# Patient Record
Sex: Female | Born: 1965 | Race: Black or African American | Hispanic: No | Marital: Married | Smoking: Former smoker
Health system: Southern US, Community
[De-identification: ages and names within clinical notes are randomized; demographics above are authoritative.]

## PROBLEM LIST (undated history)

## (undated) DIAGNOSIS — N952 Postmenopausal atrophic vaginitis: Secondary | ICD-10-CM

## (undated) DIAGNOSIS — M19041 Primary osteoarthritis, right hand: Secondary | ICD-10-CM

## (undated) DIAGNOSIS — M1990 Unspecified osteoarthritis, unspecified site: Secondary | ICD-10-CM

## (undated) DIAGNOSIS — M329 Systemic lupus erythematosus, unspecified: Secondary | ICD-10-CM

## (undated) DIAGNOSIS — F419 Anxiety disorder, unspecified: Secondary | ICD-10-CM

## (undated) DIAGNOSIS — M351 Other overlap syndromes: Secondary | ICD-10-CM

## (undated) DIAGNOSIS — M2241 Chondromalacia patellae, right knee: Principal | ICD-10-CM

## (undated) DIAGNOSIS — G479 Sleep disorder, unspecified: Secondary | ICD-10-CM

## (undated) DIAGNOSIS — M170 Bilateral primary osteoarthritis of knee: Secondary | ICD-10-CM

## (undated) DIAGNOSIS — M5136 Other intervertebral disc degeneration, lumbar region: Secondary | ICD-10-CM

## (undated) DIAGNOSIS — M549 Dorsalgia, unspecified: Secondary | ICD-10-CM

## (undated) DIAGNOSIS — R011 Cardiac murmur, unspecified: Secondary | ICD-10-CM

## (undated) DIAGNOSIS — M797 Fibromyalgia: Secondary | ICD-10-CM

## (undated) DIAGNOSIS — M5126 Other intervertebral disc displacement, lumbar region: Secondary | ICD-10-CM

## (undated) DIAGNOSIS — I10 Essential (primary) hypertension: Secondary | ICD-10-CM

## (undated) DIAGNOSIS — D649 Anemia, unspecified: Secondary | ICD-10-CM

## (undated) DIAGNOSIS — IMO0002 Reserved for concepts with insufficient information to code with codable children: Secondary | ICD-10-CM

## (undated) DIAGNOSIS — K219 Gastro-esophageal reflux disease without esophagitis: Secondary | ICD-10-CM

## (undated) DIAGNOSIS — R42 Dizziness and giddiness: Secondary | ICD-10-CM

## (undated) DIAGNOSIS — M19042 Primary osteoarthritis, left hand: Secondary | ICD-10-CM

## (undated) DIAGNOSIS — R7303 Prediabetes: Secondary | ICD-10-CM

## (undated) DIAGNOSIS — M2242 Chondromalacia patellae, left knee: Principal | ICD-10-CM

## (undated) HISTORY — DX: Chondromalacia patellae, right knee: M22.41

## (undated) HISTORY — DX: Unspecified osteoarthritis, unspecified site: M19.90

## (undated) HISTORY — DX: Other intervertebral disc degeneration, lumbar region: M51.36

## (undated) HISTORY — DX: Bilateral primary osteoarthritis of knee: M17.0

## (undated) HISTORY — PX: ABDOMINAL HYSTERECTOMY: SHX81

## (undated) HISTORY — DX: Dizziness and giddiness: R42

## (undated) HISTORY — DX: Anxiety disorder, unspecified: F41.9

## (undated) HISTORY — DX: Primary osteoarthritis, left hand: M19.042

## (undated) HISTORY — DX: Dorsalgia, unspecified: M54.9

## (undated) HISTORY — DX: Anemia, unspecified: D64.9

## (undated) HISTORY — DX: Chondromalacia patellae, left knee: M22.42

## (undated) HISTORY — DX: Postmenopausal atrophic vaginitis: N95.2

## (undated) HISTORY — DX: Essential (primary) hypertension: I10

## (undated) HISTORY — DX: Other intervertebral disc displacement, lumbar region: M51.26

## (undated) HISTORY — DX: Other overlap syndromes: M35.1

## (undated) HISTORY — PX: OTHER SURGICAL HISTORY: SHX169

## (undated) HISTORY — DX: Primary osteoarthritis, right hand: M19.041

## (undated) HISTORY — PX: TUBAL LIGATION: SHX77

## (undated) HISTORY — DX: Systemic lupus erythematosus, unspecified: M32.9

---

## 1998-05-31 ENCOUNTER — Other Ambulatory Visit: Admission: RE | Admit: 1998-05-31 | Discharge: 1998-05-31 | Payer: Self-pay | Admitting: Podiatry

## 1999-03-15 ENCOUNTER — Encounter: Admission: RE | Admit: 1999-03-15 | Discharge: 1999-06-13 | Payer: Self-pay | Admitting: Anesthesiology

## 2012-08-12 ENCOUNTER — Ambulatory Visit (INDEPENDENT_AMBULATORY_CARE_PROVIDER_SITE_OTHER): Payer: BC Managed Care – PPO | Admitting: Surgery

## 2012-08-27 ENCOUNTER — Ambulatory Visit (INDEPENDENT_AMBULATORY_CARE_PROVIDER_SITE_OTHER): Payer: BC Managed Care – PPO | Admitting: Surgery

## 2012-09-09 ENCOUNTER — Other Ambulatory Visit (INDEPENDENT_AMBULATORY_CARE_PROVIDER_SITE_OTHER): Payer: Self-pay

## 2012-09-09 ENCOUNTER — Encounter (INDEPENDENT_AMBULATORY_CARE_PROVIDER_SITE_OTHER): Payer: Self-pay | Admitting: Surgery

## 2012-09-09 ENCOUNTER — Ambulatory Visit (INDEPENDENT_AMBULATORY_CARE_PROVIDER_SITE_OTHER): Payer: BC Managed Care – PPO | Admitting: Surgery

## 2012-09-09 DIAGNOSIS — Z6841 Body Mass Index (BMI) 40.0 and over, adult: Secondary | ICD-10-CM

## 2012-09-09 DIAGNOSIS — Z0000 Encounter for general adult medical examination without abnormal findings: Secondary | ICD-10-CM

## 2012-09-09 NOTE — Progress Notes (Addendum)
Re:   Holly Davidson DOB:   01-03-1966 MRN:   [id number]  ASSESSMENT AND PLAN: 1.  Morbid obesity  Initial weight - 282,  BMI - 42.7  Per the 1991 NIH Consensus Statement, the patient is a candidate for bariatric surgery.  The patient attended our initial information session and reviewed the types of bariatric surgery.    The patient is interested in the Roux en Y Gastric Bypass.  I discussed with the patient the indications and risks of bariatric surgery.  The potential risks of surgery include, but are not limited to, bleeding, infection, leak from the bowel, DVT and PE, open surgery, long term nutrition consequences, and death.  The patient understands the importance of compliance and long term follow-up with our group after surgery.  From here we will obtain lab tests, x-rays, nutrition consult, and psych consult.  [Patient did not keep nutrition appointment.  DN  09/30/2012]  2.  Chronic pain meds  Though she is only using them about once a week. 3.  Arthritic Lupus 4.  Osteoarthritis [5.  Patient has small HH on UGI. I don't think will affect surgery.  DN 11/07/2012]  Chief Complaint  Patient presents with  . New Evaluation    new bariatric evaluation   REFERRING PHYSICIAN: MITCHELL,RAJAN, DO  HISTORY OF PRESENT ILLNESS: Holly Davidson is a 47 y.o. (DOB: December 07, 1965)  AA  female whose primary care physician is [facility], DO and comes to me today for bariatric surgery.  She is interested in the RYGB.  Patient has been to one of our information sessions the I gave.  She has tried multiple diets including Weight Watchers, Atkins, Slim fast, and a [employer]. She actually said "what I'm not tried?" She's tried different meds including Sensa and Adipex.  She has had the most success with Adipex. However, all the medicines seemed to stop working after wall.  She is on Phentermine right now.  She knows of no one who's had a gastric bypass, though she has someone that she knows who  had bariatric surgery, she just does not know what type.   I've encouraged her to come to our support group to speak to a patient who's had surgery. I've also encouraged her to bring her husband with her on her next visit.  Past Medical History  Diagnosis Date  . Arthritis   . Lupus arthritis   . Anemia   . Anxiety   . Back pain   . Herniated lumbar intervertebral disc       Past Surgical History  Procedure Laterality Date  . Abdominal hysterectomy    . Tubal ligation    . Cesarean section    . Right foot surgery        Current Outpatient Prescriptions  Medication Sig Dispense Refill  . clonazePAM (KLONOPIN) 0.5 MG tablet Take 0.5 mg by mouth 2 (two) times daily as needed for anxiety.      . cyclobenzaprine (FLEXERIL) 10 MG tablet Take 10 mg by mouth 3 (three) times daily as needed for muscle spasms.      Marland Kitchen escitalopram (LEXAPRO) 10 MG tablet Take 10 mg by mouth daily.      . furosemide (LASIX) 20 MG tablet Take 20 mg by mouth 2 (two) times daily.      Marland Kitchen oxyCODONE-acetaminophen (PERCOCET) 7.5-325 MG per tablet Take 1 tablet by mouth every 4 (four) hours as needed for pain.      . phentermine 37.5 MG capsule Take 37.5  mg by mouth every morning.       No current facility-administered medications for this visit.     No Known Allergies  REVIEW OF SYSTEMS: Skin:  No history of rash.  No history of abnormal moles. Infection:  No history of hepatitis or HIV.  No history of MRSA. Neurologic:  No history of stroke.  No history of seizure.  No history of headaches. Cardiac:  No history of hypertension. No history of heart disease.   No history of seeing a cardiologist. Pulmonary:  Does not smoke cigarettes.  No asthma or bronchitis.  No OSA/CPAP.  Endocrine:  No diabetes. No thyroid disease. Gastrointestinal:  No history of stomach disease.  No history of liver disease.  No history of gall bladder disease.  No history of pancreas disease.  No history of colon disease. Urologic:  No  history of kidney stones.  No history of bladder infections. GYN:  Hysterectomy/BSO in 2010 for endometriosis. Musculoskeletal:  Lupus arthritis and chronic knee pain.  She has used Prednisone to treat the Lupus, but does not like it.  She last used Prednisone about 4 months ago.  She has seen a rheumatologist at [facility], but cannot remember her name. Hematologic:  No bleeding disorder.  No history of anemia.  Not anticoagulated. Psycho-social:  The patient is oriented.   The patient has no obvious psychologic or social impairment to understanding our conversation and plan.  SOCIAL and FAMILY HISTORY: Married. Her husband is retired and he is fishing today. Works for [employer] risk [employer]. Two children - 23 and 48 years old.  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  They live in [place].  PHYSICAL EXAM: BP 160/90  Pulse 84  Temp(Src) 97.6 F (36.4 C)  Resp 18  Ht 5\' 8"  (1.727 m)  Wt 282 lb (127.914 kg)  BMI 42.89 kg/m2  General: AA F who is alert and generally healthy appearing.  HEENT: Normal. Pupils equal. Neck: Supple. No mass.  No thyroid mass. Lymph Nodes:  No supraclavicular or cervical nodes. Lungs: Clear to auscultation and symmetric breath sounds. Breasts:  Right - no mass  Left - no mass Heart:  RRR. No murmur or rub.  Abdomen: Soft. No mass. No tenderness. No hernia. Normal bowel sounds.  She is more pear than apple.  Her hysterectomy was done laparoscopically. Rectal: Not done. Extremities:  Good strength and ROM  in upper and lower extremities. Neurologic:  Grossly intact to motor and sensory function. Psychiatric: Has normal mood and affect. Behavior is normal.   DATA REVIEWED: Notes from Dr. Alger Simons, MD,  [facility] [address]  [address]   [place], [place]    [address] Phone:  [phone] FAX:  [phone]

## 2012-09-10 LAB — CBC WITH DIFFERENTIAL/PLATELET
Basophils Relative: 0 % (ref 0–1)
HCT: 38 % (ref 36.0–46.0)
Hemoglobin: 12.1 g/dL (ref 12.0–15.0)
Lymphocytes Relative: 36 % (ref 12–46)
Lymphs Abs: 2.3 10*3/uL (ref 0.7–4.0)
MCHC: 31.8 g/dL (ref 30.0–36.0)
Monocytes Absolute: 0.6 10*3/uL (ref 0.1–1.0)
Monocytes Relative: 9 % (ref 3–12)
Neutro Abs: 3.3 10*3/uL (ref 1.7–7.7)
RBC: 5.19 MIL/uL — ABNORMAL HIGH (ref 3.87–5.11)

## 2012-09-10 LAB — COMPREHENSIVE METABOLIC PANEL
AST: 16 U/L (ref 0–37)
BUN: 8 mg/dL (ref 6–23)
Calcium: 9.1 mg/dL (ref 8.4–10.5)
Chloride: 103 mEq/L (ref 96–112)
Creat: 0.65 mg/dL (ref 0.50–1.10)

## 2012-09-10 NOTE — Addendum Note (Signed)
Addended byEldridge Scot on: 09/10/2012 09:21 AM   Modules accepted: Orders

## 2012-09-29 ENCOUNTER — Ambulatory Visit (HOSPITAL_COMMUNITY): Payer: BC Managed Care – PPO

## 2012-09-29 ENCOUNTER — Ambulatory Visit (HOSPITAL_COMMUNITY): Admission: RE | Admit: 2012-09-29 | Payer: BC Managed Care – PPO | Source: Ambulatory Visit

## 2012-10-01 ENCOUNTER — Ambulatory Visit: Payer: BC Managed Care – PPO | Admitting: *Deleted

## 2012-10-06 ENCOUNTER — Ambulatory Visit: Payer: BC Managed Care – PPO | Admitting: *Deleted

## 2012-10-06 ENCOUNTER — Encounter (HOSPITAL_COMMUNITY): Admission: RE | Payer: Self-pay | Source: Ambulatory Visit

## 2012-10-06 ENCOUNTER — Ambulatory Visit (HOSPITAL_COMMUNITY): Admission: RE | Admit: 2012-10-06 | Payer: BC Managed Care – PPO | Source: Ambulatory Visit | Admitting: Surgery

## 2012-10-06 SURGERY — BREATH TEST, FOR HELICOBACTER PYLORI

## 2012-11-04 ENCOUNTER — Ambulatory Visit (HOSPITAL_COMMUNITY)
Admission: RE | Admit: 2012-11-04 | Discharge: 2012-11-04 | Disposition: A | Discharge status: Home / Self Care | Payer: BC Managed Care – PPO | Source: Ambulatory Visit | Attending: Surgery | Admitting: Surgery

## 2012-11-04 DIAGNOSIS — K449 Diaphragmatic hernia without obstruction or gangrene: Secondary | ICD-10-CM | POA: Insufficient documentation

## 2012-11-04 DIAGNOSIS — M1990 Unspecified osteoarthritis, unspecified site: Secondary | ICD-10-CM | POA: Insufficient documentation

## 2012-11-04 DIAGNOSIS — M329 Systemic lupus erythematosus, unspecified: Secondary | ICD-10-CM | POA: Insufficient documentation

## 2012-11-04 DIAGNOSIS — Z6841 Body Mass Index (BMI) 40.0 and over, adult: Secondary | ICD-10-CM | POA: Insufficient documentation

## 2012-11-04 DIAGNOSIS — K219 Gastro-esophageal reflux disease without esophagitis: Secondary | ICD-10-CM | POA: Insufficient documentation

## 2013-08-30 ENCOUNTER — Other Ambulatory Visit (INDEPENDENT_AMBULATORY_CARE_PROVIDER_SITE_OTHER): Payer: Self-pay

## 2013-09-07 ENCOUNTER — Other Ambulatory Visit (INDEPENDENT_AMBULATORY_CARE_PROVIDER_SITE_OTHER): Payer: Self-pay

## 2013-09-14 ENCOUNTER — Encounter (HOSPITAL_COMMUNITY)
Admission: RE | Disposition: A | Discharge status: Home / Self Care | Payer: Self-pay | Source: Ambulatory Visit | Attending: Surgery

## 2013-09-14 ENCOUNTER — Ambulatory Visit (HOSPITAL_COMMUNITY)
Admission: RE | Admit: 2013-09-14 | Discharge: 2013-09-14 | Disposition: A | Discharge status: Home / Self Care | Payer: BC Managed Care – PPO | Source: Ambulatory Visit | Attending: Surgery | Admitting: Surgery

## 2013-09-14 HISTORY — PX: BREATH TEK H PYLORI: SHX5422

## 2013-09-14 SURGERY — BREATH TEST, FOR HELICOBACTER PYLORI

## 2013-09-14 NOTE — Progress Notes (Signed)
09/14/13 [id number]  BREATH TEK ASSESSMENT  Referring MD Ovidio Kinavid Newman  Time of Last PO Intake 2100 (on 09/13/13)  Baseline Breath At: 0805  Pranactin Given At: 0808  Post-Dose Breath At: 0823  Sample 1 3.4  Sample 2 3.4  Test Negative

## 2013-09-16 ENCOUNTER — Encounter (HOSPITAL_COMMUNITY): Payer: Self-pay | Admitting: Surgery

## 2013-10-14 ENCOUNTER — Encounter: Payer: BC Managed Care – PPO | Attending: Surgery | Admitting: Dietician

## 2013-10-14 ENCOUNTER — Encounter: Payer: Self-pay | Admitting: Dietician

## 2013-10-14 DIAGNOSIS — Z713 Dietary counseling and surveillance: Secondary | ICD-10-CM | POA: Insufficient documentation

## 2013-10-14 DIAGNOSIS — Z01818 Encounter for other preprocedural examination: Secondary | ICD-10-CM | POA: Insufficient documentation

## 2013-10-14 NOTE — Patient Instructions (Signed)
Start working on [employer]. Try protein shakes. Call [facility] when surgery scheduled to enroll in Pre-Op class.

## 2013-10-14 NOTE — Progress Notes (Signed)
  Pre-Op Assessment Visit:  Pre-Operative RYGB Surgery  Medical Nutrition Therapy:  Appt start time: 0815   End time:  0845.  Patient was seen on 10/14/2013 for Pre-Operative RYGB Nutrition Assessment. Assessment and letter of approval faxed to [facility] Bariatric Surgery Program coordinator on 10/14/2013.   Preferred Learning Style:   No preference indicated   Learning Readiness:   Ready  Handouts given during visit include:  Pre-Op Goals Bariatric Surgery Protein Shakes  Teaching Method Utilized:  Visual Auditory Hands on  Barriers to learning/adherence to lifestyle change: none  Demonstrated degree of understanding via:  Teach Back   Patient to call the [facility] to enroll in Pre-Op and Post-Op Nutrition Education when surgery date is scheduled.

## 2013-10-26 DIAGNOSIS — R768 Other specified abnormal immunological findings in serum: Secondary | ICD-10-CM | POA: Insufficient documentation

## 2014-01-09 ENCOUNTER — Encounter: Payer: BC Managed Care – PPO | Attending: Surgery

## 2014-01-09 DIAGNOSIS — Z6841 Body Mass Index (BMI) 40.0 and over, adult: Secondary | ICD-10-CM | POA: Diagnosis not present

## 2014-01-09 DIAGNOSIS — Z01818 Encounter for other preprocedural examination: Secondary | ICD-10-CM | POA: Diagnosis present

## 2014-01-09 DIAGNOSIS — Z713 Dietary counseling and surveillance: Secondary | ICD-10-CM | POA: Diagnosis present

## 2014-01-11 NOTE — Progress Notes (Signed)
  Pre-Operative Nutrition Class:  Appt start time: 5916   End time:  1830.  Patient was seen on 01/09/2014 for Pre-Operative Bariatric Surgery Education at the [facility].   Surgery date: 01/30/2014 Surgery type: RYGB Start weight at [facility]: 294 lbs on 10/14/2013 Weight today: 285 lbs  TANITA  BODY COMP RESULTS  01/09/14   BMI (kg/m^2) 45.3   Fat Mass (lbs) 160   Fat Free Mass (lbs) 125   Total Body Water (lbs) 91.5   Samples given per MNT protocol. Patient educated on appropriate usage: Premier protein shake (vanilla - qty 1) Lot #: [id number] Exp: 02/2014  PB2 (plain - qty 1) Lot #: [id number] Exp: 01/04/2015  Celebrate Vitamins Multivitamin (pineapple strawberry - qty 1) Lot #: [id number] Exp: 08/2014  Renee Pain Protein Powder (unflavored - qty 1) Lot #: [id number] Exp: 03/2015   The following the learning objectives were met by the patient during this course:  Identify Pre-Op Dietary Goals and will begin 2 weeks pre-operatively  Identify appropriate sources of fluids and proteins   State protein recommendations and appropriate sources pre and post-operatively  Identify Post-Operative Dietary Goals and will follow for 2 weeks post-operatively  Identify appropriate multivitamin and calcium sources  Describe the need for physical activity post-operatively and will follow MD recommendations  State when to call healthcare provider regarding medication questions or post-operative complications  Handouts given during class include:  Pre-Op Bariatric Surgery Diet Handout  Protein Shake Handout  Post-Op Bariatric Surgery Nutrition Handout  BELT Program Information Flyer  Support Group Information Flyer  [facility] Bariatric Supplements Price List  Follow-Up Plan: Patient will follow-up at [facility] 2 weeks post operatively for diet advancement per MD.

## 2014-01-12 NOTE — Progress Notes (Signed)
To Dr. Algis Downs. Newman - Please enter preop orders in epic for Holly Davidson - she is coming to [facility] on 9/8 for preop / labs.  Thanks.

## 2014-01-17 ENCOUNTER — Encounter (HOSPITAL_COMMUNITY): Payer: Self-pay | Admitting: Pharmacy Technician

## 2014-01-20 NOTE — Patient Instructions (Addendum)
Holly Davidson  01/20/2014                           YOUR PROCEDURE IS SCHEDULED ON: 01/30/14               ENTER THRU [facility] MAIN HOSPITAL ENTRANCE AND                            FOLLOW  SIGNS TO SHORT STAY CENTER                 ARRIVE AT SHORT STAY AT: 9:45 AM               CALL THIS NUMBER IF ANY PROBLEMS THE DAY OF SURGERY :               [phone]                                REMEMBER:   Do not eat food or drink liquids AFTER MIDNIGHT                  Take these medicines the morning of surgery with               A SIPS OF WATER :    MAY TAKE CLONAZEPAM OR OXYCODONE IF NEEDED      Do not wear jewelry, make-up   Do not wear lotions, powders, or perfumes.   Do not shave legs or underarms 12 hrs. before surgery (men may shave face)  Do not bring valuables to the hospital.  Contacts, dentures or bridgework may not be worn into surgery.  Leave suitcase in the car. After surgery it may be brought to your room.  For patients admitted to the hospital more than one night, checkout time is            11:00 AM                                                    ________________________________________________________________________                                                                        [place] - PREPARING FOR SURGERY  Before surgery, you can play an important role.  Because skin is not sterile, your skin needs to be as free of germs as possible.  You can reduce the number of germs on your skin by washing with CHG (chlorahexidine gluconate) soap before surgery.  CHG is an antiseptic cleaner which kills germs and bonds with the skin to continue killing germs even after washing. Please DO NOT use if you have an allergy to CHG or antibacterial soaps.  If your skin becomes reddened/irritated stop using the CHG and inform your nurse when you arrive at Short Stay. Do not shave (including legs and underarms) for at least 48 hours prior to the first  CHG shower.  You  may shave your face. Please follow these instructions carefully:   1.  Shower with CHG Soap the night before surgery and the  morning of Surgery.   2.  If you choose to wash your hair, wash your hair first as usual with your  normal  Shampoo.   3.  After you shampoo, rinse your hair and body thoroughly to remove the  shampoo.                                         4.  Use CHG as you would any other liquid soap.  You can apply chg directly  to the skin and wash . Gently wash with scrungie or clean wascloth    5.  Apply the CHG Soap to your body ONLY FROM THE NECK DOWN.   Do not use on open                           Wound or open sores. Avoid contact with eyes, ears mouth and genitals (private parts).                        Genitals (private parts) with your normal soap.              6.  Wash thoroughly, paying special attention to the area where your surgery  will be performed.   7.  Thoroughly rinse your body with warm water from the neck down.   8.  DO NOT shower/wash with your normal soap after using and rinsing off  the CHG Soap .                9.  Pat yourself dry with a clean towel.             10.  Wear clean pajamas.             11.  Place clean sheets on your bed the night of your first shower and do not  sleep with pets.  Day of Surgery : Do not apply any lotions/deodorants the morning of surgery.  Please wear clean clothes to the hospital/surgery center.  FAILURE TO FOLLOW THESE INSTRUCTIONS MAY RESULT IN THE CANCELLATION OF YOUR SURGERY    PATIENT SIGNATURE_________________________________  ______________________________________________________________________

## 2014-01-24 ENCOUNTER — Encounter (INDEPENDENT_AMBULATORY_CARE_PROVIDER_SITE_OTHER): Payer: Self-pay

## 2014-01-24 ENCOUNTER — Encounter (HOSPITAL_COMMUNITY): Payer: Self-pay

## 2014-01-24 ENCOUNTER — Encounter (HOSPITAL_COMMUNITY)
Admission: RE | Admit: 2014-01-24 | Discharge: 2014-01-24 | Disposition: A | Discharge status: Home / Self Care | Payer: BC Managed Care – PPO | Source: Ambulatory Visit | Attending: Surgery | Admitting: Surgery

## 2014-01-24 DIAGNOSIS — Z01818 Encounter for other preprocedural examination: Secondary | ICD-10-CM | POA: Diagnosis present

## 2014-01-24 DIAGNOSIS — Z6841 Body Mass Index (BMI) 40.0 and over, adult: Secondary | ICD-10-CM | POA: Insufficient documentation

## 2014-01-24 HISTORY — DX: Fibromyalgia: M79.7

## 2014-01-24 HISTORY — DX: Systemic lupus erythematosus, unspecified: M32.9

## 2014-01-24 HISTORY — DX: Sleep disorder, unspecified: G47.9

## 2014-01-24 HISTORY — DX: Reserved for concepts with insufficient information to code with codable children: IMO0002

## 2014-01-24 LAB — DIFFERENTIAL
BASOS ABS: 0 10*3/uL (ref 0.0–0.1)
BASOS PCT: 0 % (ref 0–1)
EOS PCT: 3 % (ref 0–5)
Eosinophils Absolute: 0.2 10*3/uL (ref 0.0–0.7)
LYMPHS PCT: 36 % (ref 12–46)
Lymphs Abs: 2.4 10*3/uL (ref 0.7–4.0)
Monocytes Absolute: 0.7 10*3/uL (ref 0.1–1.0)
Monocytes Relative: 11 % (ref 3–12)
NEUTROS ABS: 3.4 10*3/uL (ref 1.7–7.7)
Neutrophils Relative %: 50 % (ref 43–77)

## 2014-01-24 LAB — CBC
HEMATOCRIT: 37 % (ref 36.0–46.0)
Hemoglobin: 11.8 g/dL — ABNORMAL LOW (ref 12.0–15.0)
MCH: 24 pg — ABNORMAL LOW (ref 26.0–34.0)
MCHC: 31.9 g/dL (ref 30.0–36.0)
MCV: 75.2 fL — ABNORMAL LOW (ref 78.0–100.0)
Platelets: 315 10*3/uL (ref 150–400)
RBC: 4.92 MIL/uL (ref 3.87–5.11)
RDW: 16.2 % — ABNORMAL HIGH (ref 11.5–15.5)
WBC: 6.7 10*3/uL (ref 4.0–10.5)

## 2014-01-24 LAB — COMPREHENSIVE METABOLIC PANEL
ALT: 12 U/L (ref 0–35)
AST: 14 U/L (ref 0–37)
Albumin: 3.5 g/dL (ref 3.5–5.2)
Alkaline Phosphatase: 63 U/L (ref 39–117)
Anion gap: 9 (ref 5–15)
BUN: 19 mg/dL (ref 6–23)
CALCIUM: 9.4 mg/dL (ref 8.4–10.5)
CHLORIDE: 101 meq/L (ref 96–112)
CO2: 27 mEq/L (ref 19–32)
CREATININE: 0.76 mg/dL (ref 0.50–1.10)
GFR calc Af Amer: >90 mL/min (ref >90)
GFR calc non Af Amer: >90 mL/min (ref >90)
Glucose, Bld: 78 mg/dL (ref 70–99)
Potassium: 4.3 mEq/L (ref 3.7–5.3)
Sodium: 137 mEq/L (ref 137–147)
Total Bilirubin: <0.2 mg/dL — ABNORMAL LOW (ref 0.3–1.2)
Total Protein: 8.1 g/dL (ref 6.0–8.3)

## 2014-01-26 ENCOUNTER — Ambulatory Visit (INDEPENDENT_AMBULATORY_CARE_PROVIDER_SITE_OTHER): Payer: BC Managed Care – PPO | Admitting: Surgery

## 2014-01-26 ENCOUNTER — Encounter (INDEPENDENT_AMBULATORY_CARE_PROVIDER_SITE_OTHER): Payer: Self-pay | Admitting: Surgery

## 2014-01-26 NOTE — Progress Notes (Signed)
Need orders in EPIC please - PT came for preop 01/24/14 - orders done per anesthesia CBC / CMET

## 2014-01-27 NOTE — Progress Notes (Signed)
No orders in [facility] for surgery. Called Dr. Allene Pyo office, spoke with Arline Asp in nursing office.  She stated she will send a message to Dr. Ezzard Standing to enter orders.  Arliss Journey that this is the 4th attempt to obtain orders (also requested by presurgical testing dept on 8/27, 9/4 and 9/10).  Will await orders.  Ardyth Gal, RN 01/27/2014

## 2014-01-30 ENCOUNTER — Encounter (HOSPITAL_COMMUNITY): Admission: RE | Payer: Self-pay | Source: Ambulatory Visit

## 2014-01-30 ENCOUNTER — Inpatient Hospital Stay (HOSPITAL_COMMUNITY): Admission: RE | Admit: 2014-01-30 | Payer: BC Managed Care – PPO | Source: Ambulatory Visit | Admitting: Surgery

## 2014-01-30 SURGERY — LAPAROSCOPIC ROUX-EN-Y GASTRIC BYPASS WITH UPPER ENDOSCOPY
Anesthesia: General

## 2014-01-31 NOTE — Progress Notes (Signed)
Cancelled appointment.  I was delayed by [employer].

## 2014-02-14 ENCOUNTER — Ambulatory Visit: Payer: BC Managed Care – PPO

## 2014-02-16 ENCOUNTER — Ambulatory Visit (INDEPENDENT_AMBULATORY_CARE_PROVIDER_SITE_OTHER): Payer: BC Managed Care – PPO | Admitting: Surgery

## 2014-03-01 NOTE — Progress Notes (Signed)
Please put orders in Epic surgery 03-14-14 pre op 03-08-14 Thanks

## 2014-03-02 ENCOUNTER — Encounter (HOSPITAL_COMMUNITY): Payer: Self-pay | Admitting: Pharmacy Technician

## 2014-03-08 ENCOUNTER — Encounter (HOSPITAL_COMMUNITY): Payer: Self-pay

## 2014-03-08 ENCOUNTER — Encounter (HOSPITAL_COMMUNITY)
Admission: RE | Admit: 2014-03-08 | Discharge: 2014-03-08 | Disposition: A | Discharge status: Home / Self Care | Payer: BC Managed Care – PPO | Source: Ambulatory Visit | Attending: Surgery | Admitting: Surgery

## 2014-03-08 DIAGNOSIS — Z01812 Encounter for preprocedural laboratory examination: Secondary | ICD-10-CM | POA: Diagnosis not present

## 2014-03-08 LAB — COMPREHENSIVE METABOLIC PANEL
ALBUMIN: 3.5 g/dL (ref 3.5–5.2)
ALK PHOS: 58 U/L (ref 39–117)
ALT: 11 U/L (ref 0–35)
ANION GAP: 11 (ref 5–15)
AST: 14 U/L (ref 0–37)
BUN: 22 mg/dL (ref 6–23)
CALCIUM: 9.4 mg/dL (ref 8.4–10.5)
CO2: 26 mEq/L (ref 19–32)
CREATININE: 0.71 mg/dL (ref 0.50–1.10)
Chloride: 101 mEq/L (ref 96–112)
GFR calc non Af Amer: >90 mL/min (ref >90)
GLUCOSE: 88 mg/dL (ref 70–99)
POTASSIUM: 4.4 meq/L (ref 3.7–5.3)
Sodium: 138 mEq/L (ref 137–147)
TOTAL PROTEIN: 8.1 g/dL (ref 6.0–8.3)
Total Bilirubin: 0.2 mg/dL — ABNORMAL LOW (ref 0.3–1.2)

## 2014-03-08 LAB — CBC
HEMATOCRIT: 36.6 % (ref 36.0–46.0)
HEMOGLOBIN: 11.7 g/dL — AB (ref 12.0–15.0)
MCH: 23.6 pg — AB (ref 26.0–34.0)
MCHC: 32 g/dL (ref 30.0–36.0)
MCV: 73.8 fL — AB (ref 78.0–100.0)
Platelets: 318 10*3/uL (ref 150–400)
RBC: 4.96 MIL/uL (ref 3.87–5.11)
RDW: 16.5 % — AB (ref 11.5–15.5)
WBC: 5.9 10*3/uL (ref 4.0–10.5)

## 2014-03-08 NOTE — Patient Instructions (Addendum)
20 Shir Lamar SprinklesS Eads  03/08/2014   Your procedure is scheduled on: 10-27-  -2015 Tuesday  [facility] and [facility]. Arrive at    0515    AM..  Call this number if you have problems the morning of surgery: [phone]  Or Presurgical Testing [phone].   For Living Will and/or Health Care Power Attorney Forms: please provide copy for your medical record,may bring AM of surgery(Forms should be already notarized -we do not provide this service).(03-08-14 No information preferred today).  Remember: Follow any bowel prep instructions per MD office.    Do not eat food/ or drink: After Midnight.      Take these medicines the morning of surgery with A SIP OF WATER: Oxycodone. Citalopram.   Do not wear jewelry, make-up or nail polish.  Do not wear deodorant, lotions, powders, or perfumes.   Do not shave legs and under arms- 48 hours(2 days) prior to first CHG shower.(Shaving face and neck okay.)  Do not bring valuables to the hospital.(Hospital is not responsible for lost valuables).  Contacts, dentures or removable bridgework, body piercing, hair pins may not be worn into surgery.  Leave suitcase in the car. After surgery it may be brought to your room.  For patients admitted to the hospital, checkout time is 11:00 AM the day of discharge.(Restricted visitors-Any Persons displaying flu-like symptoms or illness).    Patients discharged the day of surgery will not be allowed to drive home. Must have responsible person with you x 24 hours once discharged.  Name and phone number of your driver: Adair Laundrysizha spouse will have pt. Cell [phone]     Please read over the following fact sheets that you were given:  CHG(Chlorhexidine Gluconate 4% Surgical Soap) use.           - Preparing for Surgery Before surgery, you can play an important role.  Because skin is not sterile, your skin needs to be as free of germs as possible.   You can reduce the number of germs on your skin by washing with CHG (chlorahexidine gluconate) soap before surgery.  CHG is an antiseptic cleaner which kills germs and bonds with the skin to continue killing germs even after washing. Please DO NOT use if you have an allergy to CHG or antibacterial soaps.  If your skin becomes reddened/irritated stop using the CHG and inform your nurse when you arrive at Short Stay. Do not shave (including legs and underarms) for at least 48 hours prior to the first CHG shower.  You may shave your face/neck. Please follow these instructions carefully:  1.  Shower with CHG Soap the night before surgery and the  morning of Surgery.  2.  If you choose to wash your hair, wash your hair first as usual with your  normal  shampoo.  3.  After you shampoo, rinse your hair and body thoroughly to remove the  shampoo.                           4.  Use CHG as you would any other liquid soap.  You can apply chg directly  to the skin and wash                       Gently with a scrungie or clean washcloth.  5.  Apply the CHG Soap to your body ONLY FROM  THE NECK DOWN.   Do not use on face/ open                           Wound or open sores. Avoid contact with eyes, ears mouth and genitals (private parts).                       Wash face,  Genitals (private parts) with your normal soap.             6.  Wash thoroughly, paying special attention to the area where your surgery  will be performed.  7.  Thoroughly rinse your body with warm water from the neck down.  8.  DO NOT shower/wash with your normal soap after using and rinsing off  the CHG Soap.                9.  Pat yourself dry with a clean towel.            10.  Wear clean pajamas.            11.  Place clean sheets on your bed the night of your first shower and do not  sleep with pets. Day of Surgery : Do not apply any lotions/deodorants the morning of surgery.  Please wear clean clothes to the hospital/surgery  center.  FAILURE TO FOLLOW THESE INSTRUCTIONS MAY RESULT IN THE CANCELLATION OF YOUR SURGERY PATIENT SIGNATURE_________________________________  NURSE SIGNATURE__________________________________  ________________________________________________________________________

## 2014-03-08 NOTE — Progress Notes (Addendum)
03-08-14 Pt here for PAT visit, need MD order entry in Epic."Jason" of [facility] made awre-orders needed-pt. Here.

## 2014-03-08 NOTE — Pre-Procedure Instructions (Addendum)
03-08-14 1100 Pt. Here for PAT visit, no MD order entry in Epic. Called to "jason,[facility] to make aware of no MD order entry.

## 2014-03-13 NOTE — Anesthesia Preprocedure Evaluation (Signed)
Anesthesia Evaluation  Patient identified by MRN, date of birth, ID band Patient awake    Reviewed: Allergy & Precautions, H&P , NPO status , Patient's Chart, lab work & pertinent test results  Airway Mallampati: II  TM Distance: >3 FB Neck ROM: Full    Dental no notable dental hx.    Pulmonary neg pulmonary ROS, former smoker,  breath sounds clear to auscultation  Pulmonary exam normal       Cardiovascular negative cardio ROS  Rhythm:Regular Rate:Normal     Neuro/Psych Anxiety negative neurological ROS  negative psych ROS   GI/Hepatic negative GI ROS, Neg liver ROS,   Endo/Other  Morbid obesity  Renal/GU negative Renal ROS     Musculoskeletal  (+) Arthritis -, Fibromyalgia -  Abdominal (+) + obese,   Peds  Hematology  (+) anemia ,   Anesthesia Other Findings   Reproductive/Obstetrics negative OB ROS                             Anesthesia Physical Anesthesia Plan  ASA: III  Anesthesia Plan: General   Post-op Pain Management:    Induction: Intravenous  Airway Management Planned: Oral ETT  Additional Equipment:   Intra-op Plan:   Post-operative Plan: Extubation in OR  Informed Consent: I have reviewed the patients History and Physical, chart, labs and discussed the procedure including the risks, benefits and alternatives for the proposed anesthesia with the patient or authorized representative who has indicated his/her understanding and acceptance.   Dental advisory given  Plan Discussed with: CRNA  Anesthesia Plan Comments:         Anesthesia Quick Evaluation

## 2014-03-13 NOTE — Progress Notes (Signed)
03-13-14 1030a Need MD order entry in Epic.

## 2014-03-13 NOTE — Pre-Procedure Instructions (Signed)
03-13-14 1015 - no MD order entry in Epic.

## 2014-03-14 ENCOUNTER — Inpatient Hospital Stay (HOSPITAL_COMMUNITY)
Admission: RE | Admit: 2014-03-14 | Discharge: 2014-03-17 | DRG: 621 | Disposition: A | Discharge status: Home / Self Care | Payer: BC Managed Care – PPO | Source: Ambulatory Visit | Attending: Surgery | Admitting: Surgery

## 2014-03-14 ENCOUNTER — Encounter (HOSPITAL_COMMUNITY): Payer: Self-pay | Admitting: *Deleted

## 2014-03-14 ENCOUNTER — Other Ambulatory Visit (INDEPENDENT_AMBULATORY_CARE_PROVIDER_SITE_OTHER): Payer: Self-pay | Admitting: Surgery

## 2014-03-14 ENCOUNTER — Inpatient Hospital Stay (HOSPITAL_COMMUNITY): Payer: BC Managed Care – PPO | Admitting: Anesthesiology

## 2014-03-14 ENCOUNTER — Encounter (HOSPITAL_COMMUNITY)
Admission: RE | Disposition: A | Discharge status: Home / Self Care | Payer: Self-pay | Source: Ambulatory Visit | Attending: Surgery

## 2014-03-14 ENCOUNTER — Encounter (HOSPITAL_COMMUNITY): Payer: BC Managed Care – PPO | Admitting: Anesthesiology

## 2014-03-14 DIAGNOSIS — R339 Retention of urine, unspecified: Secondary | ICD-10-CM | POA: Diagnosis not present

## 2014-03-14 DIAGNOSIS — Z01812 Encounter for preprocedural laboratory examination: Secondary | ICD-10-CM

## 2014-03-14 DIAGNOSIS — Z9889 Other specified postprocedural states: Secondary | ICD-10-CM

## 2014-03-14 DIAGNOSIS — Z833 Family history of diabetes mellitus: Secondary | ICD-10-CM | POA: Diagnosis not present

## 2014-03-14 DIAGNOSIS — G8929 Other chronic pain: Secondary | ICD-10-CM | POA: Diagnosis present

## 2014-03-14 DIAGNOSIS — M1990 Unspecified osteoarthritis, unspecified site: Secondary | ICD-10-CM | POA: Diagnosis present

## 2014-03-14 DIAGNOSIS — Z6841 Body Mass Index (BMI) 40.0 and over, adult: Secondary | ICD-10-CM | POA: Diagnosis not present

## 2014-03-14 HISTORY — PX: GASTRIC ROUX-EN-Y: SHX5262

## 2014-03-14 LAB — CBC WITH DIFFERENTIAL/PLATELET
Basophils Absolute: 0 10*3/uL (ref 0.0–0.1)
Basophils Relative: 0 % (ref 0–1)
EOS PCT: 4 % (ref 0–5)
Eosinophils Absolute: 0.2 10*3/uL (ref 0.0–0.7)
HEMATOCRIT: 34.9 % — AB (ref 36.0–46.0)
HEMOGLOBIN: 11.2 g/dL — AB (ref 12.0–15.0)
LYMPHS ABS: 2.1 10*3/uL (ref 0.7–4.0)
LYMPHS PCT: 40 % (ref 12–46)
MCH: 24 pg — ABNORMAL LOW (ref 26.0–34.0)
MCHC: 32.1 g/dL (ref 30.0–36.0)
MCV: 74.7 fL — AB (ref 78.0–100.0)
MONO ABS: 0.6 10*3/uL (ref 0.1–1.0)
MONOS PCT: 10 % (ref 3–12)
Neutro Abs: 2.5 10*3/uL (ref 1.7–7.7)
Neutrophils Relative %: 46 % (ref 43–77)
Platelets: 270 10*3/uL (ref 150–400)
RBC: 4.67 MIL/uL (ref 3.87–5.11)
RDW: 16.7 % — ABNORMAL HIGH (ref 11.5–15.5)
WBC: 5.4 10*3/uL (ref 4.0–10.5)

## 2014-03-14 LAB — HEMOGLOBIN AND HEMATOCRIT, BLOOD
HCT: 38.1 % (ref 36.0–46.0)
Hemoglobin: 12 g/dL (ref 12.0–15.0)

## 2014-03-14 SURGERY — LAPAROSCOPIC ROUX-EN-Y GASTRIC BYPASS WITH UPPER ENDOSCOPY
Anesthesia: General | Site: Abdomen

## 2014-03-14 MED ORDER — PROPOFOL 10 MG/ML IV BOLUS
INTRAVENOUS | Status: AC
Start: 2014-03-14 — End: 2014-03-14
  Filled 2014-03-14: qty 20

## 2014-03-14 MED ORDER — FENTANYL CITRATE 0.05 MG/ML IJ SOLN
INTRAMUSCULAR | Status: AC
  Filled 2014-03-14: qty 5

## 2014-03-14 MED ORDER — LIDOCAINE HCL (CARDIAC) 20 MG/ML IV SOLN
INTRAVENOUS | Status: AC
  Filled 2014-03-14: qty 5

## 2014-03-14 MED ORDER — ACETAMINOPHEN 10 MG/ML IV SOLN
1000.0000 mg | Freq: Once | INTRAVENOUS | Status: DC
  Filled 2014-03-14: qty 100

## 2014-03-14 MED ORDER — ONDANSETRON HCL 4 MG/2ML IJ SOLN
INTRAMUSCULAR | Status: AC
  Filled 2014-03-14: qty 2

## 2014-03-14 MED ORDER — CHLORHEXIDINE GLUCONATE 4 % EX LIQD: 60.0000 mL | Freq: Once | CUTANEOUS | Status: DC

## 2014-03-14 MED ORDER — GLYCOPYRROLATE 0.2 MG/ML IJ SOLN
INTRAMUSCULAR | Status: AC
  Filled 2014-03-14: qty 3

## 2014-03-14 MED ORDER — ACETAMINOPHEN 160 MG/5ML PO SOLN
650.0000 mg | ORAL | Status: DC | PRN
  Administered 2014-03-16: 650 mg via ORAL

## 2014-03-14 MED ORDER — TISSEEL VH 10 ML EX KIT
PACK | CUTANEOUS | Status: DC | PRN
  Administered 2014-03-14 (×2): 10 mL

## 2014-03-14 MED ORDER — DEXTROSE 5 % IV SOLN: 2.0000 g | Freq: Once | INTRAVENOUS | Status: DC

## 2014-03-14 MED ORDER — HEPARIN SODIUM (PORCINE) 5000 UNIT/ML IJ SOLN
5000.0000 [IU] | Freq: Three times a day (TID) | INTRAMUSCULAR | Status: DC
  Administered 2014-03-14 – 2014-03-17 (×8): 5000 [IU] via SUBCUTANEOUS
  Filled 2014-03-14 (×11): qty 1

## 2014-03-14 MED ORDER — LACTATED RINGERS IV SOLN
INTRAVENOUS | Status: DC | PRN
  Administered 2014-03-14 (×3): via INTRAVENOUS

## 2014-03-14 MED ORDER — CISATRACURIUM BESYLATE 20 MG/10ML IV SOLN
INTRAVENOUS | Status: AC
  Filled 2014-03-14: qty 10

## 2014-03-14 MED ORDER — ACETAMINOPHEN 10 MG/ML IV SOLN
INTRAVENOUS | Status: DC | PRN
  Administered 2014-03-14: 1000 mg via INTRAVENOUS

## 2014-03-14 MED ORDER — MEPERIDINE HCL 50 MG/ML IJ SOLN: 6.2500 mg | INTRAMUSCULAR | Status: DC | PRN

## 2014-03-14 MED ORDER — HYDROMORPHONE HCL 1 MG/ML IJ SOLN
0.2500 mg | INTRAMUSCULAR | Status: DC | PRN
  Administered 2014-03-14 (×2): 0.5 mg via INTRAVENOUS

## 2014-03-14 MED ORDER — HEPARIN SODIUM (PORCINE) 5000 UNIT/ML IJ SOLN: 5000.0000 [IU] | Freq: Once | INTRAMUSCULAR | Status: DC

## 2014-03-14 MED ORDER — HEPARIN SODIUM (PORCINE) 5000 UNIT/ML IJ SOLN
5000.0000 [IU] | INTRAMUSCULAR | Status: AC
  Administered 2014-03-14: 5000 [IU] via SUBCUTANEOUS
  Filled 2014-03-14: qty 1

## 2014-03-14 MED ORDER — LIDOCAINE HCL (CARDIAC) 20 MG/ML IV SOLN
INTRAVENOUS | Status: DC | PRN
  Administered 2014-03-14: 75 mg via INTRAVENOUS

## 2014-03-14 MED ORDER — CEFOXITIN SODIUM 2 G IV SOLR
2.0000 g | INTRAVENOUS | Status: AC
  Administered 2014-03-14 (×2): 2 g via INTRAVENOUS

## 2014-03-14 MED ORDER — ONDANSETRON HCL 4 MG/2ML IJ SOLN
INTRAMUSCULAR | Status: DC | PRN
  Administered 2014-03-14: 4 mg via INTRAVENOUS
  Administered 2014-03-14 (×2): 2 mg via INTRAVENOUS

## 2014-03-14 MED ORDER — MORPHINE SULFATE 2 MG/ML IJ SOLN
2.0000 mg | INTRAMUSCULAR | Status: DC | PRN
  Administered 2014-03-14 – 2014-03-15 (×4): 4 mg via INTRAVENOUS
  Administered 2014-03-15: 2 mg via INTRAVENOUS
  Administered 2014-03-15 (×4): 4 mg via INTRAVENOUS
  Administered 2014-03-15: 6 mg via INTRAVENOUS
  Administered 2014-03-16 (×2): 2 mg via INTRAVENOUS
  Administered 2014-03-16 (×2): 4 mg via INTRAVENOUS
  Filled 2014-03-14 (×5): qty 2
  Filled 2014-03-14: qty 1
  Filled 2014-03-14 (×3): qty 2
  Filled 2014-03-14: qty 3
  Filled 2014-03-14: qty 1
  Filled 2014-03-14 (×3): qty 2

## 2014-03-14 MED ORDER — ACETAMINOPHEN 160 MG/5ML PO SOLN
325.0000 mg | ORAL | Status: DC | PRN
  Filled 2014-03-14: qty 20.3

## 2014-03-14 MED ORDER — BUPIVACAINE HCL (PF) 0.25 % IJ SOLN
INTRAMUSCULAR | Status: AC
  Filled 2014-03-14: qty 30

## 2014-03-14 MED ORDER — HYDROMORPHONE HCL 1 MG/ML IJ SOLN
INTRAMUSCULAR | Status: AC
  Filled 2014-03-14: qty 1

## 2014-03-14 MED ORDER — CISATRACURIUM BESYLATE (PF) 10 MG/5ML IV SOLN
INTRAVENOUS | Status: DC | PRN
  Administered 2014-03-14: 8 mg via INTRAVENOUS
  Administered 2014-03-14: 4 mg via INTRAVENOUS
  Administered 2014-03-14: 6 mg via INTRAVENOUS
  Administered 2014-03-14: 4 mg via INTRAVENOUS
  Administered 2014-03-14 (×3): 2 mg via INTRAVENOUS

## 2014-03-14 MED ORDER — NEOSTIGMINE METHYLSULFATE 10 MG/10ML IV SOLN
INTRAVENOUS | Status: DC | PRN
Start: 2014-03-14 — End: 2014-03-14
  Administered 2014-03-14: 4 mg via INTRAVENOUS

## 2014-03-14 MED ORDER — UNJURY CHOCOLATE CLASSIC POWDER: 2.0000 [oz_av] | Freq: Four times a day (QID) | ORAL | Status: DC

## 2014-03-14 MED ORDER — SUCCINYLCHOLINE CHLORIDE 20 MG/ML IJ SOLN
INTRAMUSCULAR | Status: DC | PRN
  Administered 2014-03-14: 140 mg via INTRAVENOUS

## 2014-03-14 MED ORDER — PROMETHAZINE HCL 25 MG/ML IJ SOLN: 6.2500 mg | INTRAMUSCULAR | Status: DC | PRN

## 2014-03-14 MED ORDER — NEOSTIGMINE METHYLSULFATE 10 MG/10ML IV SOLN
INTRAVENOUS | Status: AC
  Filled 2014-03-14: qty 1

## 2014-03-14 MED ORDER — DEXTROSE 5 % IV SOLN
INTRAVENOUS | Status: AC
  Filled 2014-03-14: qty 2

## 2014-03-14 MED ORDER — OXYCODONE HCL 5 MG/5ML PO SOLN
5.0000 mg | ORAL | Status: DC | PRN
  Administered 2014-03-15 – 2014-03-17 (×6): 10 mg via ORAL
  Filled 2014-03-14: qty 50
  Filled 2014-03-14 (×3): qty 10
  Filled 2014-03-14: qty 50
  Filled 2014-03-14: qty 10

## 2014-03-14 MED ORDER — TISSEEL VH 10 ML EX KIT
PACK | CUTANEOUS | Status: AC
  Filled 2014-03-14: qty 2

## 2014-03-14 MED ORDER — DEXAMETHASONE SODIUM PHOSPHATE 10 MG/ML IJ SOLN
INTRAMUSCULAR | Status: DC | PRN
  Administered 2014-03-14: 10 mg via INTRAVENOUS

## 2014-03-14 MED ORDER — UNJURY VANILLA POWDER
2.0000 [oz_av] | Freq: Four times a day (QID) | ORAL | Status: DC
  Administered 2014-03-16: 2 [oz_av] via ORAL

## 2014-03-14 MED ORDER — ONDANSETRON HCL 4 MG/2ML IJ SOLN
4.0000 mg | INTRAMUSCULAR | Status: DC | PRN
  Administered 2014-03-14 – 2014-03-15 (×2): 4 mg via INTRAVENOUS
  Filled 2014-03-14 (×2): qty 2

## 2014-03-14 MED ORDER — UNJURY CHICKEN SOUP POWDER
2.0000 [oz_av] | Freq: Four times a day (QID) | ORAL | Status: DC
  Administered 2014-03-16 – 2014-03-17 (×3): 2 [oz_av] via ORAL

## 2014-03-14 MED ORDER — DEXAMETHASONE SODIUM PHOSPHATE 10 MG/ML IJ SOLN
INTRAMUSCULAR | Status: AC
  Filled 2014-03-14: qty 1

## 2014-03-14 MED ORDER — FENTANYL CITRATE 0.05 MG/ML IJ SOLN
INTRAMUSCULAR | Status: DC | PRN
  Administered 2014-03-14 (×9): 50 ug via INTRAVENOUS

## 2014-03-14 MED ORDER — MIDAZOLAM HCL 2 MG/2ML IJ SOLN
INTRAMUSCULAR | Status: AC
  Filled 2014-03-14: qty 2

## 2014-03-14 MED ORDER — BUPIVACAINE HCL (PF) 0.25 % IJ SOLN
INTRAMUSCULAR | Status: DC | PRN
  Administered 2014-03-14: 30 mL

## 2014-03-14 MED ORDER — MIDAZOLAM HCL 5 MG/5ML IJ SOLN
INTRAMUSCULAR | Status: DC | PRN
  Administered 2014-03-14 (×2): 0.5 mg via INTRAVENOUS

## 2014-03-14 MED ORDER — PROPOFOL 10 MG/ML IV BOLUS
INTRAVENOUS | Status: DC | PRN
  Administered 2014-03-14: 200 mg via INTRAVENOUS

## 2014-03-14 MED ORDER — PROMETHAZINE HCL 25 MG/ML IJ SOLN
6.2500 mg | Freq: Four times a day (QID) | INTRAMUSCULAR | Status: DC | PRN
  Administered 2014-03-14: 12.5 mg via INTRAVENOUS
  Filled 2014-03-14: qty 1

## 2014-03-14 MED ORDER — POTASSIUM CHLORIDE IN NACL 20-0.45 MEQ/L-% IV SOLN
INTRAVENOUS | Status: DC
  Administered 2014-03-14 – 2014-03-16 (×7): via INTRAVENOUS
  Filled 2014-03-14 (×17): qty 1000

## 2014-03-14 MED ORDER — GLYCOPYRROLATE 0.2 MG/ML IJ SOLN
INTRAMUSCULAR | Status: DC | PRN
  Administered 2014-03-14: 0.2 mg via INTRAVENOUS
  Administered 2014-03-14: .4 mg via INTRAVENOUS

## 2014-03-14 MED ORDER — LACTATED RINGERS IR SOLN
Status: DC | PRN
  Administered 2014-03-14: 1000 mL

## 2014-03-14 SURGICAL SUPPLY — 66 items
ADH SKN CLS APL DERMABOND .7 (GAUZE/BANDAGES/DRESSINGS) ×2
APL SRG 32X5 SNPLK LF DISP (MISCELLANEOUS) ×2
BAG SPEC RTRVL LRG 6X4 10 (ENDOMECHANICALS)
BLADE SURG 15 STRL LF DISP TIS (BLADE) ×1 IMPLANT
BLADE SURG 15 STRL SS (BLADE) ×2
CABLE HIGH FREQUENCY MONO STRZ (ELECTRODE) ×3 IMPLANT
CHLORAPREP W/TINT 26ML (MISCELLANEOUS) ×3 IMPLANT
CLIP SUT LAPRA TY ABSORB (SUTURE) ×9 IMPLANT
DECANTER SPIKE VIAL GLASS SM (MISCELLANEOUS) ×3 IMPLANT
DERMABOND ADVANCED (GAUZE/BANDAGES/DRESSINGS) ×4
DERMABOND ADVANCED .7 DNX12 (GAUZE/BANDAGES/DRESSINGS) ×2 IMPLANT
DEVICE SUTURE ENDOST 10MM (ENDOMECHANICALS) ×3 IMPLANT
DISSECTOR BLUNT TIP ENDO 5MM (MISCELLANEOUS) IMPLANT
DRAIN PENROSE 18X1/4 LTX STRL (WOUND CARE) ×3 IMPLANT
DRAPE CAMERA CLOSED 9X96 (DRAPES) ×3 IMPLANT
DUPLOJECT EASY PREP 4ML (MISCELLANEOUS) ×6 IMPLANT
FILTER SMOKE EVAC LAPAROSHD (FILTER) ×3 IMPLANT
GAUZE SPONGE 4X4 16PLY XRAY LF (GAUZE/BANDAGES/DRESSINGS) ×3 IMPLANT
GLOVE SURG SIGNA 7.5 PF LTX (GLOVE) ×3 IMPLANT
GOWN SPEC L4 XLG W/TWL (GOWN DISPOSABLE) ×3 IMPLANT
GOWN STRL REUS W/TWL XL LVL3 (GOWN DISPOSABLE) ×21 IMPLANT
HOVERMATT SINGLE USE (MISCELLANEOUS) ×3 IMPLANT
KIT BASIN OR (CUSTOM PROCEDURE TRAY) ×3 IMPLANT
KIT GASTRIC LAVAGE 34FR ADT (SET/KITS/TRAYS/PACK) ×3 IMPLANT
NEEDLE SPNL 22GX3.5 QUINCKE BK (NEEDLE) ×3 IMPLANT
PACK CARDIOVASCULAR III (CUSTOM PROCEDURE TRAY) ×3 IMPLANT
PEN SKIN MARKING BROAD (MISCELLANEOUS) ×3 IMPLANT
POUCH SPECIMEN RETRIEVAL 10MM (ENDOMECHANICALS) IMPLANT
RELOAD 45 VASCULAR/THIN (ENDOMECHANICALS) ×6 IMPLANT
RELOAD ENDO STITCH 2.0 (ENDOMECHANICALS) ×36
RELOAD STAPLE TA45 3.5 REG BLU (ENDOMECHANICALS) ×6 IMPLANT
RELOAD STAPLER BLUE 60MM (STAPLE) ×3 IMPLANT
RELOAD STAPLER GOLD 60MM (STAPLE) IMPLANT
RELOAD STAPLER WHITE 60MM (STAPLE) IMPLANT
SCISSORS LAP 5X35 DISP (ENDOMECHANICALS) ×3 IMPLANT
SEALANT SURGICAL APPL DUAL CAN (MISCELLANEOUS) ×6 IMPLANT
SET IRRIG TUBING LAPAROSCOPIC (IRRIGATION / IRRIGATOR) ×3 IMPLANT
SHEARS HARMONIC ACE PLUS 36CM (ENDOMECHANICALS) ×3 IMPLANT
SLEEVE ADV FIXATION 12X100MM (TROCAR) ×3 IMPLANT
SLEEVE ENDOPATH XCEL 5M (ENDOMECHANICALS) ×3 IMPLANT
SOLUTION ANTI FOG 6CC (MISCELLANEOUS) ×3 IMPLANT
STAPLE ECHEON FLEX 60 POW ENDO (STAPLE) ×3 IMPLANT
STAPLER RELOAD BLUE 60MM (STAPLE) ×9
STAPLER RELOAD GOLD 60MM (STAPLE)
STAPLER RELOAD WHITE 60MM (STAPLE)
STAPLER VISISTAT 35W (STAPLE) ×3 IMPLANT
SUT MNCRL AB 4-0 PS2 18 (SUTURE) ×3 IMPLANT
SUT MON AB 5-0 PS2 18 (SUTURE) ×3 IMPLANT
SUT RELOAD ENDO STITCH 2 48X1 (ENDOMECHANICALS) ×8
SUT RELOAD ENDO STITCH 2.0 (ENDOMECHANICALS) ×4
SUT VIC AB 2-0 SH 27 (SUTURE) ×3
SUT VIC AB 2-0 SH 27X BRD (SUTURE) ×1 IMPLANT
SUTURE RELOAD END STTCH 2 48X1 (ENDOMECHANICALS) ×8 IMPLANT
SUTURE RELOAD ENDO STITCH 2.0 (ENDOMECHANICALS) ×4 IMPLANT
SYR 20CC LL (SYRINGE) ×6 IMPLANT
SYR 50ML LL SCALE MARK (SYRINGE) ×3 IMPLANT
TOWEL OR 17X26 10 PK STRL BLUE (TOWEL DISPOSABLE) ×3 IMPLANT
TRAY FOLEY CATH 14FRSI W/METER (CATHETERS) ×3 IMPLANT
TROCAR ADV FIXATION 12X100MM (TROCAR) ×6 IMPLANT
TROCAR ADV FIXATION 5X100MM (TROCAR) IMPLANT
TROCAR BLADELESS OPT 5 100 (ENDOMECHANICALS) IMPLANT
TROCAR UNIVERSAL OPT 12M 100M (ENDOMECHANICALS) ×3 IMPLANT
TROCAR XCEL 12X100 BLDLESS (ENDOMECHANICALS) IMPLANT
TROCAR XCEL NON-BLD 11X100MML (ENDOMECHANICALS) ×3 IMPLANT
TUBING ENDO SMARTCAP (MISCELLANEOUS) ×3 IMPLANT
TUBING FILTER THERMOFLATOR (ELECTROSURGICAL) ×3 IMPLANT

## 2014-03-14 NOTE — Transfer of Care (Signed)
Immediate Anesthesia Transfer of Care Note  Patient: Holly Davidson  Procedure(s) Performed: Procedure(s): LAPAROSCOPIC ROUX-EN-Y GASTRIC BYPASS WITH UPPER ENDOSCOPY (N/A)  Patient Location: PACU  Anesthesia Type:General  Level of Consciousness: awake, oriented, patient cooperative, lethargic and responds to stimulation  Airway & Oxygen Therapy: Patient Spontanous Breathing and Patient connected to face mask oxygen  Post-op Assessment: Report given to PACU RN, Post -op Vital signs reviewed and stable and Patient moving all extremities  Post vital signs: Reviewed and stable  Complications: No apparent anesthesia complications

## 2014-03-14 NOTE — Anesthesia Postprocedure Evaluation (Signed)
Anesthesia Post Note  Patient: Holly Davidson  Procedure(s) Performed: Procedure(s) (LRB): LAPAROSCOPIC ROUX-EN-Y GASTRIC BYPASS WITH UPPER ENDOSCOPY (N/A)  Anesthesia type: General  Patient location: PACU  Post pain: Pain level controlled  Post assessment: Post-op Vital signs reviewed  Last Vitals: BP 129/71  Pulse 78  Temp(Src) 36.4 C (Oral)  Resp 14  Ht 5\' 7"  (1.702 m)  Wt 282 lb (127.914 kg)  BMI 44.16 kg/m2  SpO2 100%  Post vital signs: Reviewed  Level of consciousness: sedated  Complications: No apparent anesthesia complications

## 2014-03-14 NOTE — Op Note (Signed)
PATIENT:   Holly Davidson DOB:   06/18/1965 MRN:   [id number]  DATE OF PROCEDURE: 03/14/2014                   [facility]  OPERATIVE REPORT  PREOPERATIVE DIAGNOSIS:  Morbid obesity.  POSTOPERATIVE DIAGNOSIS:  Morbid obesity (weight 282, BMI of 44.2).  PROCEDURE:  Laparoscopic Roux-en-Y gastric bypass, antecolic, antegastric (intraoperative upper endoscopy by Dr. Johna SheriffHoxworth)  SURGEON:  Sandria Balesavid H. Ezzard StandingNewman, MD  FIRST ASSISTANT:  Dr. Johna SheriffHoxworth  ANESTHESIA:  General endotracheal.  Anesthesiologist: Gaylan GeroldJohn R Germeroth, MD CRNA: Thornell MuleHoward G Stubblefield, CRNA; Edison PaceJoanne E Gray, CRNA  General  ESTIMATED BLOOD LOSS:  Minimal.  LOCAL ANESTHESIA:  30 cc of 1/4% Marcaine  COMPLICATIONS:  None.  INDICATION FOR SURGERY:  Holly Lamar SprinklesS Davidson is a 48 y.o. AA  female who sees HOUT, BRITTANY, PA-C as her primary care doctor.  She has completed our preoperative bariatric program and now comes for a laparoscopic Roux-en-Y gastric bypass.  The indications, potential complications of surgery were explained to the patient.  Potential complications of the surgery include, but are not limited to, bleeding, infection, DVT, open surgery, and long-term nutritional consequences.  OPERATIVE NOTE:  The [facility] where Ms. Holly GoodellPerry underwent a general endotracheal anesthetic, supervised by Anesthesiologist: Gaylan GeroldJohn R Germeroth, MD CRNA: Thornell MuleHoward G Stubblefield, CRNA; Edison PaceJoanne E Gray, CRNA.  The patient was given 2 g of cefoxitin at the beginning of the procedure.  A time-out was held and surgical checklist run.  The abdomen was prepped with ChloraPrep and sterilely draped.  I accessed the abdominal cavity through the left upper quadrant using a 12 mm Optiview trocar.  I placed 6 additional trocars: 5 mm subxiphoid, 12 mm right subcostal, 12 mm right paramedian, 12 mm left paramedian, 5 mm lateral subcostal, and a 11 mm below to the right of the umbilicus.  The abdomen was insufflated and abdominal  exploration carried out.  Right and left lobes of liver unremarkable.  The stomach that I could see was unremarkable.  The patient had a small greater omentum which draped over the bowel.  She had more fat in her abdominal wall and less intra-abdominal fat.  The thick abdominal wall created some difficulty in manipulating the trocars in the abdomen.  I was able to push the omentum and transverse colon up and identified the ligament of Treitz to start the operation.  I measured 40 cm of the jejunum, starting at the ligament of Tritz, and divided the jejunum with a white load of 45 mm Ethicon Endo-GIA stapler.  I divided a short length into the mesentery.  I measured 100 cm of jejunum for the future gastric limb.  I put a Penrose drain on the future gastric limb of the jejunum.  I then did a side-to-side jejunojejunostomy.  I used a 45 mm white load of the Ethicon Endo-GIA stapler.  I closed the enterotomy with 2 running 2-0 Vicryl sutures.  I tested the JJ anastomosis with an alligator forceps and then covered this with Tisseel.  I closed the mesenteric defect with a running 2-0 silk suture with a Laparo-tye on each end.  I then divided the omentum with a Harmonic Scalpel.  I positioned the patient in reverse Trendelenburg and placed the liver retractor, which was introduced into the peritoneal cavity through a subxiphoid 5 mm trocar puncture, under the left lobe of the liver.  I then identified the gastroesophageal junction.  I went  to the left at the angle of His and made a window at the left side of the esophago-gastric junction for a target as my dissection.  I then went on the lesser curve of the stomach, measured 5 cm from the gastroesophageal junction down the lesser curve and dissected into the lesser sac from the lesser curvature side of the stomach.  I did the first firing of a 45 mm blue load Ethicon Endo-GIA stapler and then did 4 firings of the 60 mm blue load Ethicon Eschelon  stapler.  This created a gastric pouch approximately 5 cm in length and 3 cm in width.  There was no bleeding from either the pouch or the stomach remnant site.  I placed Tisseel on the pouch side along the new greater curvature.  I over sewed the gastric remnant with a locking 2-0 Vicryl suture with a Laparo-tye on each end..  I then brought the jejunum ante-colic, ante-gastric up to the new stomach pouch and placed a posterior running 2-0 Vicryl suture.  I then made an enterotomy into the stomach using the Ewald as a back stop and an enterotomy into the jejunum.  I did a stapled side-to-side gastrojejunal anastomosis using these two enterotomies with a 45 mm blue load of the Ethicon Endo GIA stapler.  I tried to create a 2.5 cm gastrojejunal anastomosis.  I closed the enterotomy with a 2 running 2-0 Vicryl sutures.  I passed the Ewald tube through the gastrojejunal anastomosis and then did an anterior Connell suture running of 2-0 Vicryl suture for the anterior layer of the gastrojejunostomy.  The Ewald tube was then removed without difficulty.  I then closed the [place] defect with a figure-of-eight 2-0 silk suture between the mesentery of the transverse colon and the mesentery of the distal jejunum.  Dr. Johna SheriffHoxworth then scrubbed out and did an intraoperative upper endoscopy.  He identified the esophagogastric junction about 40 cm, the gastrojejunal anastomosis about 45 cm.  I clamped off the small bowel.  He insufflated air and I flooded the abdomen with saline. There was no bubbling or evidence of air leak.  He then withdrew the scope and he will dictate that portion of the operation.    I then re-inspected the anastomoses, sucked out the saline, placed Tisseel over the stomach pouch and gastrojejunal anastomosis.   The liver retractor was removed.  The trocars were removed.  There was no bleeding at any trocar site.  The skin at each trocar site was closed with a 5-0 Monocryl suture.  I infiltrated  a total about 30 cc of 0.25% Marcaine at the trocar sites.    After the skin incisions were closed with sutures they were painted with Dermabond.  The sponge and needle count were correct at the end of the case.  The patient tolerated the procedure well, was transported to the recovery room in good condition.   Ovidio Kinavid Izzy Courville, MD, [facility] Pager: [phone] Office phone:  [phone]

## 2014-03-14 NOTE — Progress Notes (Signed)
Patient stated she need to pee,but has tried X2 on the bedside commode but was unable. Patient requested to be in and out cathed. RN in and out cathed patient and 1100cc clear yellow urine returned. Patient tolerated well and states she feels much better now. Will continue to monitor. C.Sussie Minor,RN

## 2014-03-14 NOTE — H&P (Signed)
Holly Davidson 02/16/2014 11:48 AM Location: [facility] Patient #: [id number] DOB: 08/15/1965 Married / Language: English / Race: Black or African American Female  History of Present Illness Onalee Hua(Westyn Keatley H. Ezzard StandingNewman MD; 02/16/2014 12:37 PM) Patient words: Pre-op.  The patient is a 48 year old female who presents for a bariatric surgery evaluation. Her PCP is [place] Hout, [place]. [facility]. (She was with Dr. Gabriel Cirriajan Mitchell, but he left) She comes by herself. This is her pre op visit. We messed up with her last visit, because we had just gone [employer] - I was very behind - and she could not wait for me.  She is ready for surgery. She says that she has lost about 10 pounds since she first started the process. But her weight is about the same as when I last saw her. She is ready for the surgery. She has her protein drinks and vitamins. She is prepared. I answered questions about surgery.  UGI - 11/04/2013 - has small HH US - 11/04/2013 - negative She saw Dr. Cyndia SkeetersLurey 10/05/2103.  She is here for surgery for morbid obesity Initial weight - 282, BMI - 42.7 Per the 1991 NIH Consensus Statement, the patient is a candidate for bariatric surgery. The patient attended our initial information session and reviewed the types of bariatric surgery. The patient is interested in the Roux en Y Gastric Bypass. I discussed with the patient the indications and risks of bariatric surgery. The potential risks of surgery include, but are not limited to, bleeding, infection, leak from the bowel, DVT and PE, open surgery, long term nutrition consequences, and death. The patient understands the importance of compliance and long term follow-up with our group after surgery.  Other medical problems: 2. Chronic pain meds Though she is only using them about once a week. 3. Arthritic Lupus 4. Osteoarthritis  SOCIAL and FAMILY HISTORY: Married. Her husband is retired and he is  fishing today. 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 Two children - 2328 and 49 years old. 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 [place] in [place].   Other Problems [facility](Dahionnarah [place], [place]; 02/16/2014 11:50 AM) Arthritis Back Pain Heart murmur Oophorectomy  Past Surgical History ([place]ah [place], [place]; 02/16/2014 11:50 AM) Cesarean Section - 1 Foot Surgery Right. Hysterectomy (not due to cancer) - Complete  Diagnostic Studies History [facility] Grooms([place]ah [place], [place]; 02/16/2014 11:50 AM) Colonoscopy never Mammogram >3 years ago Pap Smear >5 years ago  Allergies [facility]([place]ionnarah [place], RMA; 02/16/2014 11:55 AM) No Known Drug Allergies10/05/2013  Medication History (Dahionnarah Maldonado, RMA; 02/16/2014 11:58 AM) Acetaminophen (500MG  Tablet, Oral) Active. KlonoPIN (0.5MG  Tablet, Oral) Active. Flexeril (10MG  Tablet, Oral) Active. Benadryl Allergy (25MG  Capsule, Oral) Active. Lexapro (20MG  Tablet, Oral) Active. Multiple Vitamin (Oral) Active. OxyCODONE HCl ER (10MG  Tab 12HR Deter, Oral) Active. TraZODone HCl (100MG  Tablet, Oral) Active. Medications Reconciled  Social History Sinclair Grooms(Dahionnarah [place], [place]; 02/16/2014 11:50 AM) Alcohol use Remotely quit alcohol use. Illicit drug use Remotely quit drug use. No caffeine use Tobacco use Former smoker.  Family History Sinclair Grooms(Dahionnarah [place], [place]; 02/16/2014 11:50 AM) Diabetes Mellitus Sister. Heart Disease Sister. Heart disease in female family member before age 49 Hypertension Sister. Respiratory Condition Family Members In General.  Pregnancy / Birth History Dolores Patty(Dahionnarah [place]donado, [place]MA; 02/16/2014 11:50 AM) Age at menarche 11 years. Age of menopause <45 Gravida 4 Maternal age 49-20 Para 3  Review of Systems Sinclair Grooms(Dahionnarah [place] RMA; 02/16/2014 11:50 AM) General Not Present- Appetite Loss, Chills, Fatigue, Fever, Night Sweats, Weight Gain and Weight Loss. Skin  Not Present- Change in Wart/Mole, Dryness,  Hives, Jaundice, New Lesions, Non-Healing Wounds, Rash and Ulcer. HEENT Present- Seasonal Allergies. Not Present- Earache, Hearing Loss, Hoarseness, Nose Bleed, Oral Ulcers, Ringing in the Ears, Sinus Pain, Sore Throat, Visual Disturbances, Wears glasses/contact lenses and Yellow Eyes. Respiratory Not Present- Bloody sputum, Chronic Cough, Difficulty Breathing, Snoring and Wheezing. Breast Not Present- Breast Mass, Breast Pain, Nipple Discharge and Skin Changes. Cardiovascular Not Present- Chest Pain, Difficulty Breathing Lying Down, Leg Cramps, Palpitations, Rapid Heart Rate, Shortness of Breath and Swelling of Extremities. Gastrointestinal Not Present- Abdominal Pain, Bloating, Bloody Stool, Change in Bowel Habits, Chronic diarrhea, Constipation, Difficulty Swallowing, Excessive gas, Gets full quickly at meals, Hemorrhoids, Indigestion, Nausea, Rectal Pain and Vomiting. Female Genitourinary Not Present- Frequency, Nocturia, Painful Urination, Pelvic Pain and Urgency. Musculoskeletal Present- Back Pain, Joint Pain and Joint Stiffness. Not Present- Muscle Pain, Muscle Weakness and Swelling of Extremities. Neurological Not Present- Decreased Memory, Fainting, Headaches, Numbness, Seizures, Tingling, Tremor, Trouble walking and Weakness. Psychiatric Present- Anxiety. Not Present- Bipolar, Change in Sleep Pattern, Depression, Fearful and Frequent crying. Endocrine Present- Hot flashes. Not Present- Cold Intolerance, Excessive Hunger, Hair Changes, Heat Intolerance and New Diabetes. Hematology Not Present- Easy Bruising, Excessive bleeding, Gland problems, HIV and Persistent Infections.   Vitals (Dahionnarah Maldonado RMA; 02/16/2014 11:55 AM) 02/16/2014 11:51 AM Weight: 283.4 lb Height: 67in Body Surface Area: 2.47 m Body Mass Index: 44.39 kg/m Temp.: 6F  BP: 144/96 (Sitting, Left Arm, Standard)   Physical Exam: BP 131/84  Pulse 86  Temp(Src) 98 F (36.7 C) (Oral)  Resp 18  Ht 5'  7" (1.702 m)  Wt 282 lb (127.914 kg)  BMI 44.16 kg/m2  SpO2 96%  General: AA F who is alert and generally healthy appearing. HEENT: Normal. Pupils equal.  Neck: Supple. No mass. No thyroid mass. Lymph Nodes: No supraclavicular or cervical nodes. Lungs: Clear to auscultation and symmetric breath sounds.  Heart: RRR. No murmur or rub. Abdomen: Soft. No mass. No tenderness. No hernia. Normal bowel sounds. She is more pear than apple. Her hysterectomy was done laparoscopically. Rectal: Not done.  Extremities: Good strength and ROM in upper and lower extremities. Neurologic: Grossly intact to motor and sensory function. Psychiatric: Has normal mood and affect. Behavior is normal.  Assessment & Plan Onalee Hua(Clee Pandit H. Cheyeanne Roadcap MD; 02/16/2014 12:39 PM) MORBID OBESITY WITH BMI OF 40.0-44.9, ADULT (278.01  E66.01) Impression: For surgery 03/14/2014  Her husband is with her.  She ate an banana after she took her bowel prep yesterday.  Ovidio Kinavid Chavonne Sforza, MD, [facility] Pager: [phone] Office phone:  [phone]

## 2014-03-14 NOTE — Anesthesia Procedure Notes (Signed)
Procedure Name: Intubation Date/Time: 03/14/2014 7:20 AM Performed by: Edison PaceGRAY, Nalah Macioce E Pre-anesthesia Checklist: Patient identified, Timeout performed, Emergency Drugs available, Suction available and Patient being monitored Patient Re-evaluated:Patient Re-evaluated prior to inductionOxygen Delivery Method: Circle system utilized Preoxygenation: Pre-oxygenation with 100% oxygen Intubation Type: IV induction Ventilation: Mask ventilation without difficulty Laryngoscope Size: Mac and 4 Grade View: Grade I Tube type: Oral Tube size: 7.5 mm Number of attempts: 1 Airway Equipment and Method: Stylet Placement Confirmation: ETT inserted through vocal cords under direct vision,  positive ETCO2 and breath sounds checked- equal and bilateral Secured at: 21 cm Tube secured with: Tape Dental Injury: Teeth and Oropharynx as per pre-operative assessment

## 2014-03-14 NOTE — Op Note (Signed)
Upper GI endoscopy is performed at the completion of laparoscopic Roux-en-Y gastric bypass by Dr. Daphine DeutscherMartin. The Olympus video endoscope was inserted into the upper esophagus and then passed under direct vision to the EG junction. The small gastric pouch was insufflated with air while the gastric outlet was clamped under irrigation by the operating surgeon. There was no evidence of leak. The anastomosis was visualized and was patent. Suture and staple lines were intact and without bleeding. The pouch was tubular and measured 4-5 cm in length. At the completion of the procedure the pouch was desufflated and the scope withdrawn.  Mariella SaaBenjamin T Bethannie Iglehart MD, FACS  03/14/2014, 2:54 PM

## 2014-03-15 ENCOUNTER — Encounter (HOSPITAL_COMMUNITY): Payer: Self-pay | Admitting: Surgery

## 2014-03-15 ENCOUNTER — Inpatient Hospital Stay (HOSPITAL_COMMUNITY): Payer: BC Managed Care – PPO

## 2014-03-15 DIAGNOSIS — Z48812 Encounter for surgical aftercare following surgery on the circulatory system: Secondary | ICD-10-CM

## 2014-03-15 LAB — CBC WITH DIFFERENTIAL/PLATELET
BASOS ABS: 0 10*3/uL (ref 0.0–0.1)
Basophils Relative: 0 % (ref 0–1)
Eosinophils Absolute: 0 10*3/uL (ref 0.0–0.7)
Eosinophils Relative: 0 % (ref 0–5)
HEMATOCRIT: 35.6 % — AB (ref 36.0–46.0)
Hemoglobin: 11.2 g/dL — ABNORMAL LOW (ref 12.0–15.0)
LYMPHS PCT: 16 % (ref 12–46)
Lymphs Abs: 1.6 10*3/uL (ref 0.7–4.0)
MCH: 23.4 pg — ABNORMAL LOW (ref 26.0–34.0)
MCHC: 31.5 g/dL (ref 30.0–36.0)
MCV: 74.5 fL — AB (ref 78.0–100.0)
MONO ABS: 0.9 10*3/uL (ref 0.1–1.0)
Monocytes Relative: 9 % (ref 3–12)
Neutro Abs: 7.7 10*3/uL (ref 1.7–7.7)
Neutrophils Relative %: 75 % (ref 43–77)
Platelets: 276 10*3/uL (ref 150–400)
RBC: 4.78 MIL/uL (ref 3.87–5.11)
RDW: 16.6 % — AB (ref 11.5–15.5)
WBC: 10.1 10*3/uL (ref 4.0–10.5)

## 2014-03-15 LAB — HEMOGLOBIN AND HEMATOCRIT, BLOOD
HCT: 37.3 % (ref 36.0–46.0)
HEMOGLOBIN: 11.6 g/dL — AB (ref 12.0–15.0)

## 2014-03-15 IMAGING — RF DG UGI W/ GASTROGRAFIN
9 series · 9 of 9 positions shown · IV contrast (omnipaque)
Comparison: None.

FLUOROSCOPY TIME:  1 min 12 seconds

CLINICAL DATA: Gastric bypass.

EXAM:
WATER SOLUBLE UPPER GI SERIES
TECHNIQUE: Single-column upper GI series was performed using water soluble
contrast.
CONTRAST:  50mL OMNIPAQUE IOHEXOL 300 MG/ML  SOLN

[Series 1: run · 1 of 1 slices shown (1 of 8)]
[im 1/1]
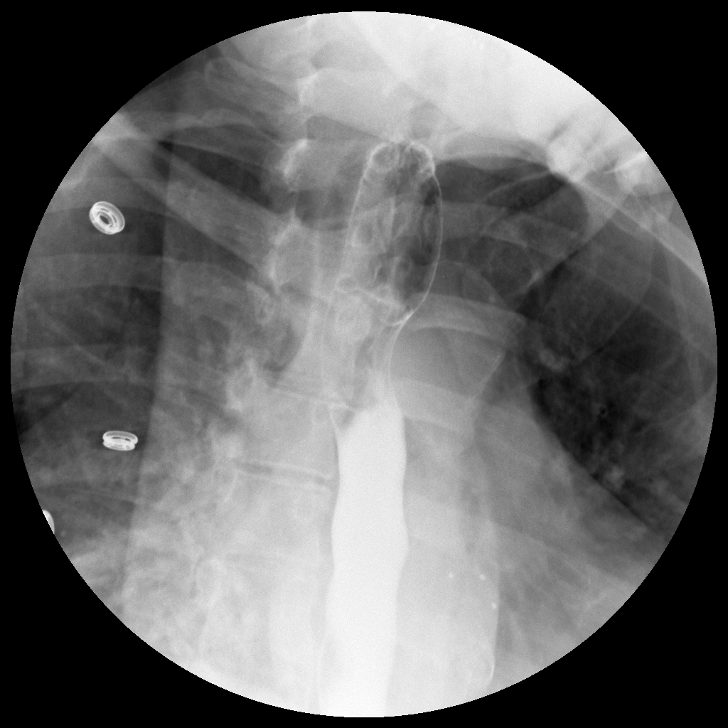

[Series 2: run · 1 of 1 slices shown (2 of 8)]
[im 1/1]
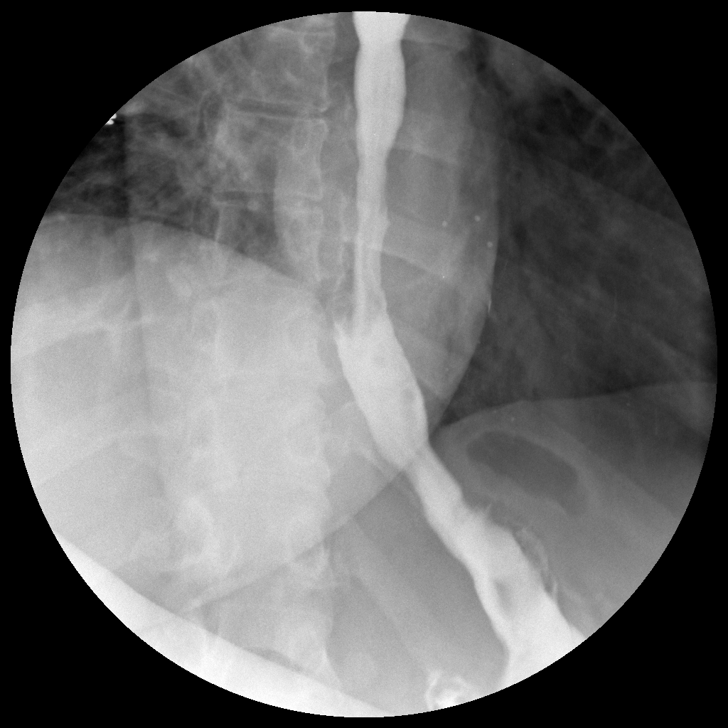

[Series 3: run · 1 of 1 slices shown (3 of 8)]
[im 1/1]
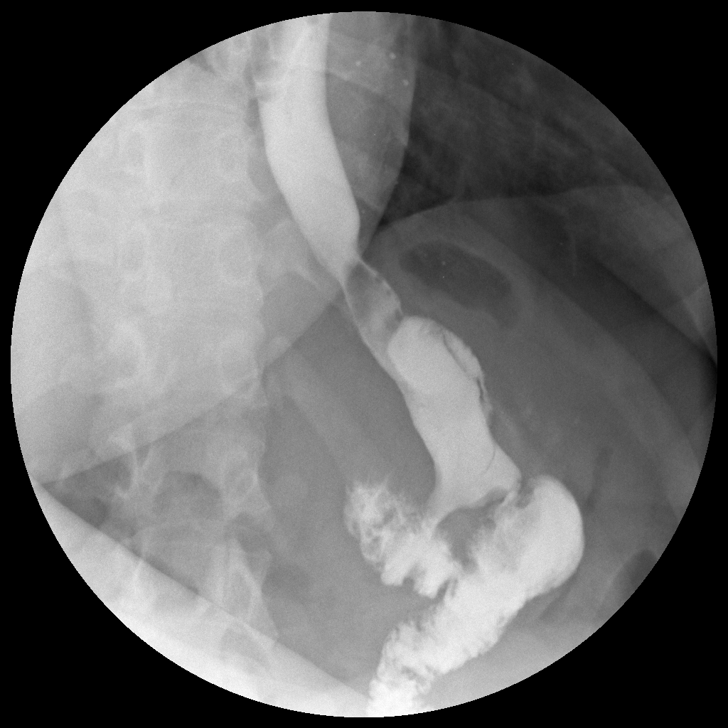

[Series 4: run · 1 of 1 slices shown (4 of 8)]
[im 1/1]
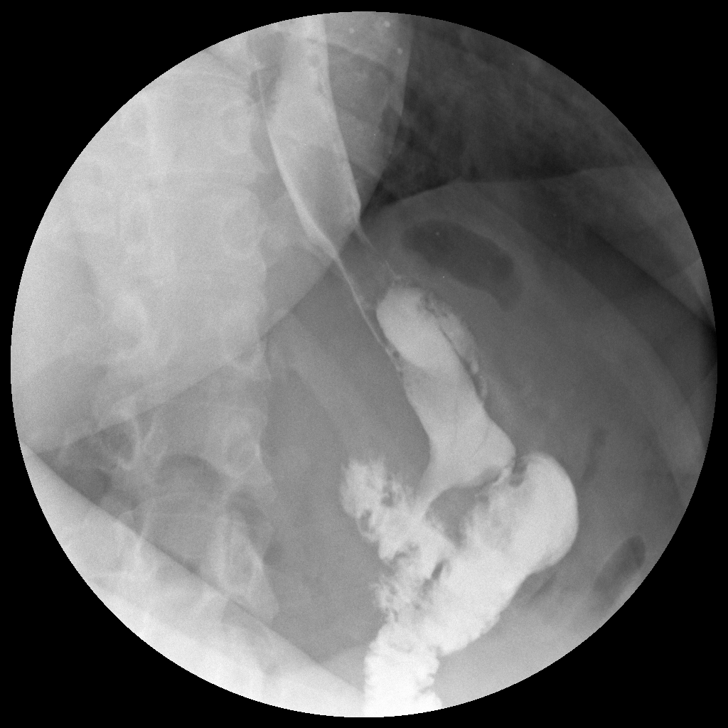

[Series 5: run · 1 of 1 slices shown (5 of 8)]
[im 1/1]
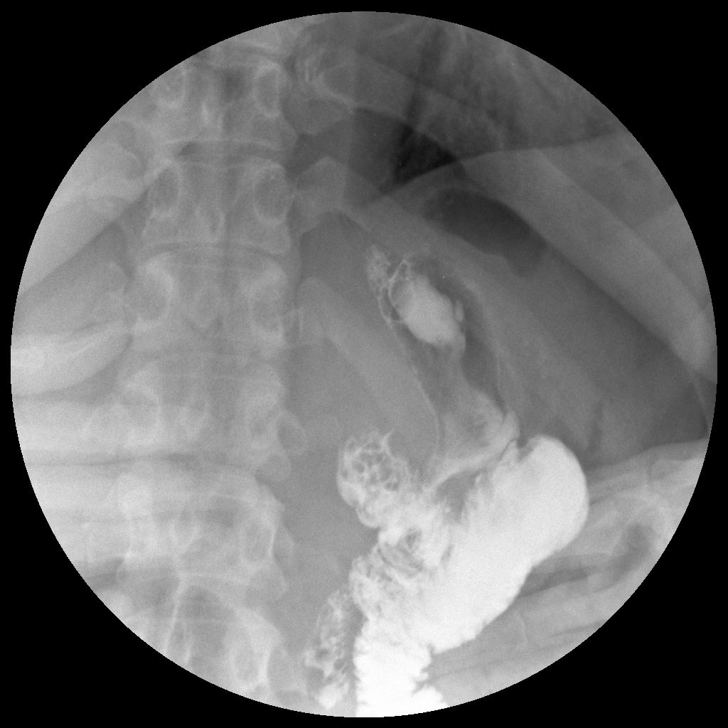

[Series 6: run · 1 of 1 slices shown (6 of 8)]
[im 1/1]
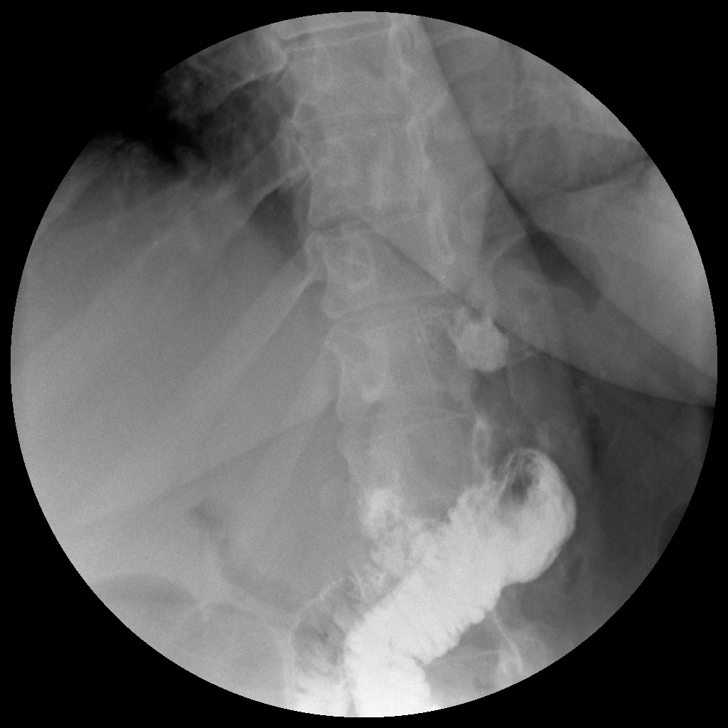

[Series 7: run · 1 of 1 slices shown (7 of 8)]
[im 1/1]
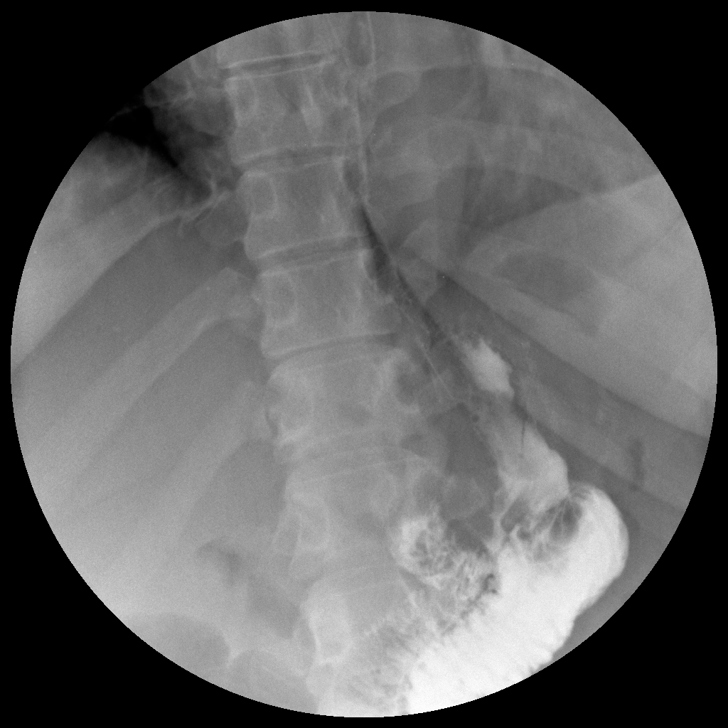

[Series 8: run · 1 of 1 slices shown (8 of 8)]
[im 1/1]
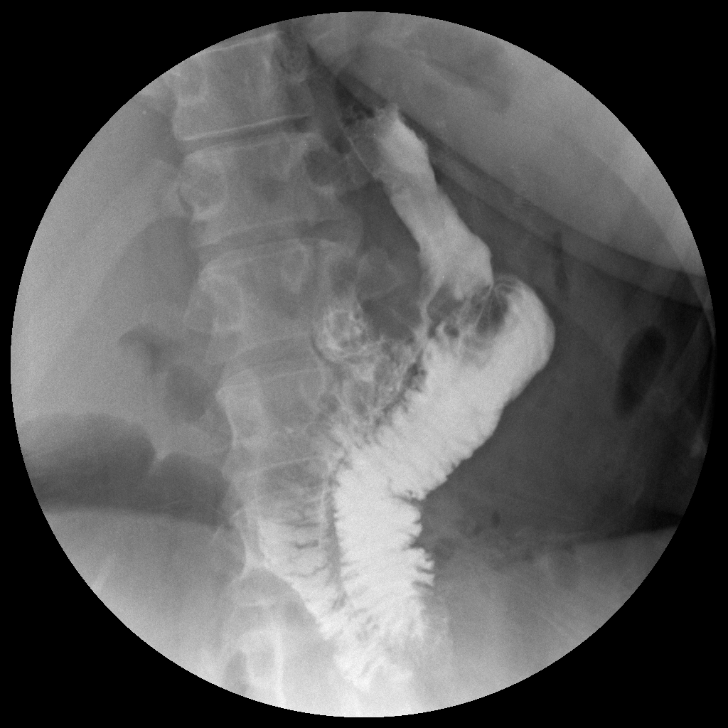

[Series 1001: view not recorded · 0.20mm/px · 1 of 1 slices shown]
[im 1/1]
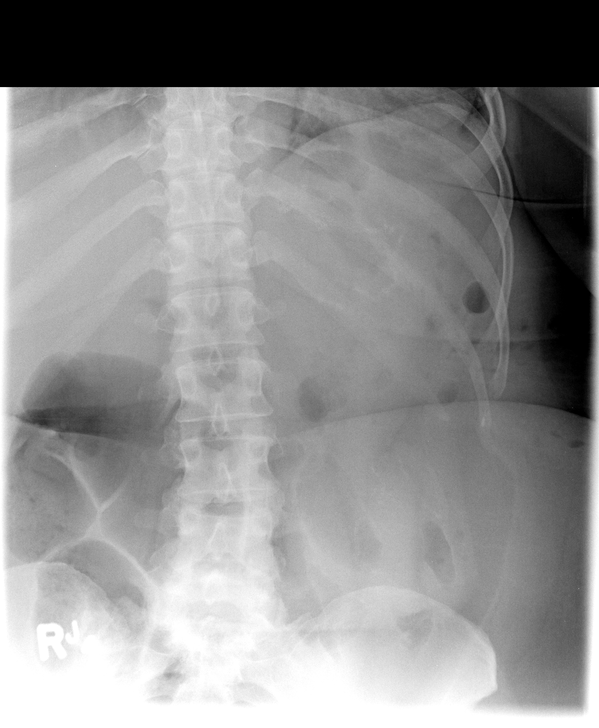

[9 of 9 positions shown; findings below may reference images not displayed]

FINDINGS: Esophagus is unremarkable. Gastric remnant is unremarkable. The
gastroenteric anastomosis is widely patent. Normal gastric emptying.
No leak.
IMPRESSION: Normal exam.

## 2014-03-15 MED ORDER — IOHEXOL 300 MG/ML  SOLN
50.0000 mL | Freq: Once | INTRAMUSCULAR | Status: AC | PRN
  Administered 2014-03-15: 50 mL via ORAL

## 2014-03-15 NOTE — Progress Notes (Signed)
Pt had straight catheter twice today around 4:00 and 9:30 pm due to inability to void in spite of attempted long sitting on the bedside commode. Around 12 midnight, she complained again of fullness and scanned about 599 cc in her bladder. She requested if she can have a foley catheter due to her discomfort. MD on call DR. Daphine DeutscherMartin, notified of event and ordered a foley catheter to be placed in for her acute urinary retention. Will continue to monitor.

## 2014-03-15 NOTE — Progress Notes (Signed)
General Surgery Note  LOS: 1 day  POD -  1 Day Post-Op  Assessment/Plan: 1.  LAPAROSCOPIC ROUX-EN-Y GASTRIC BYPASS WITH UPPER ENDOSCOPY - 03/15/2014 - D. Dresden Lozito  Morbid obesity - Initial weight - 282, BMI - 42.7  For swallow today  Sore  2. Chronic pain meds   Though she is only using them about once a week.  3. Arthritic Lupus  4. Osteoarthritis 5.  Urinary retention 6.  DVT prophylaxis - SQ Heparin   Active Problems:   Morbid obesity, Initial weight - 282, BMI - 42.7   Subjective:  Husband in room.  Doing okay, just sore. Objective:   Filed Vitals:   03/15/14 0551  BP: 150/86  Pulse: 83  Temp: 98 F (36.7 C)  Resp: 14     Intake/Output from previous day:  10/27 0701 - 10/28 0700 In: 2960 [I.V.:2960] Out: 4425 [Urine:4425]  Intake/Output this shift:      Physical Exam:   General: Obese AA F who is alert and oriented.    HEENT: Normal. Pupils equal. .   Lungs: Clear   Abdomen: Soft   Wound: Okay     Lab Results:    Recent Labs  03/14/14 0555 03/14/14 1707 03/15/14 0353  WBC 5.4  --  10.1  HGB 11.2* 12.0 11.2*  HCT 34.9* 38.1 35.6*  PLT 270  --  276    BMET  No results found for this basename: NA, K, CL, CO2, GLUCOSE, BUN, CREATININE, CALCIUM,  in the last 72 hours  PT/INR  No results found for this basename: LABPROT, INR,  in the last 72 hours  ABG  No results found for this basename: PHART, PCO2, PO2, HCO3,  in the last 72 hours   Studies/Results:  No results found.   Anti-infectives:   Anti-infectives   Start     Dose/Rate Route Frequency Ordered Stop   03/14/14 0600  cefOXitin (MEFOXIN) 2 g in dextrose 5 % 50 mL IVPB     2 g 100 mL/hr over 30 Minutes Intravenous On call to O.R. 03/14/14 0522 03/14/14 0900   03/14/14 0530  cefOXitin (MEFOXIN) 2 g in dextrose 5 % 50 mL IVPB  Status:  Discontinued     2 g 100 mL/hr over 30 Minutes Intravenous  Once 03/14/14 0520 03/14/14 0532      Ovidio Kinavid Juliocesar Blasius, MD, FACS Pager: [phone] [facility] Office: [phone] 03/15/2014

## 2014-03-15 NOTE — Progress Notes (Signed)
Encouraged patient to ambulate. She has refused to ambulate thus far.

## 2014-03-15 NOTE — Progress Notes (Signed)
VASCULAR LAB PRELIMINARY  PRELIMINARY  PRELIMINARY  PRELIMINARY  Bilateral lower extremity venous duplex  completed.    Preliminary report:  Bilateral:  No evidence of DVT, superficial thrombosis, or Baker's Cyst.    Josselyn Harkins, RVT 03/15/2014, 10:21 AM

## 2014-03-15 NOTE — Care Management Note (Signed)
    Page 1 of 1   03/15/2014     10:44:17 AM CARE MANAGEMENT NOTE 03/15/2014  Patient:  Holly Davidson,Holly Davidson   Account Number:  [id number]  Date Initiated:  03/15/2014  Documentation initiated by:  Lorenda IshiharaPEELE,Erhard Senske  Subjective/Objective Assessment:   48 yo female admitted Davidson/p gastric bypass. PTA lived at home with spouse.     Action/Plan:   Home when stable, no anticipated needs.   Anticipated DC Date:  03/17/2014   Anticipated DC Plan:  HOME/SELF CARE      DC Planning Services  CM consult      Choice offered to / List presented to:             Status of service:  Completed, signed off Medicare Important Message given?   (If response is "NO", the following Medicare IM given date fields will be blank) Date Medicare IM given:   Medicare IM given by:   Date Additional Medicare IM given:   Additional Medicare IM given by:    Discharge Disposition:  HOME/SELF CARE  Per UR Regulation:  Reviewed for med. necessity/level of care/duration of stay  If discussed at Long Length of Stay Meetings, dates discussed:    Comments:

## 2014-03-15 NOTE — Progress Notes (Signed)
Patient alert and oriented, Post op day 1.  Provided support and encouragement.  Encouraged pulmonary toilet, ambulation and small sips of liquids when swallow study returned satisfactorily.  All questions answered.  Will continue to monitor. 

## 2014-03-16 LAB — CBC WITH DIFFERENTIAL/PLATELET
Basophils Absolute: 0 10*3/uL (ref 0.0–0.1)
Basophils Relative: 0 % (ref 0–1)
Eosinophils Absolute: 0.1 10*3/uL (ref 0.0–0.7)
Eosinophils Relative: 1 % (ref 0–5)
HCT: 35.5 % — ABNORMAL LOW (ref 36.0–46.0)
HEMOGLOBIN: 11.1 g/dL — AB (ref 12.0–15.0)
LYMPHS ABS: 1.2 10*3/uL (ref 0.7–4.0)
Lymphocytes Relative: 13 % (ref 12–46)
MCH: 23.5 pg — ABNORMAL LOW (ref 26.0–34.0)
MCHC: 31.3 g/dL (ref 30.0–36.0)
MCV: 75.2 fL — AB (ref 78.0–100.0)
Monocytes Absolute: 1.4 10*3/uL — ABNORMAL HIGH (ref 0.1–1.0)
Monocytes Relative: 16 % — ABNORMAL HIGH (ref 3–12)
Neutro Abs: 6.5 10*3/uL (ref 1.7–7.7)
Neutrophils Relative %: 70 % (ref 43–77)
PLATELETS: 249 10*3/uL (ref 150–400)
RBC: 4.72 MIL/uL (ref 3.87–5.11)
RDW: 16.5 % — ABNORMAL HIGH (ref 11.5–15.5)
WBC: 9.2 10*3/uL (ref 4.0–10.5)

## 2014-03-16 MED ORDER — ALUM & MAG HYDROXIDE-SIMETH 200-200-20 MG/5ML PO SUSP
30.0000 mL | Freq: Four times a day (QID) | ORAL | Status: DC | PRN
  Administered 2014-03-16: 30 mL via ORAL
  Filled 2014-03-16: qty 30

## 2014-03-16 NOTE — Plan of Care (Signed)
Problem: Food- and Nutrition-Related Knowledge Deficit (NB-1.1) Goal: Nutrition education Formal process to instruct or train a patient/client in a skill or to impart knowledge to help patients/clients voluntarily manage or modify food choices and eating behavior to maintain or improve health. Outcome: Completed/Met Date Met:  03/16/14 Nutrition Education Note  Received consult for diet education per DROP protocol.   10/27 S/P LAPAROSCOPIC ROUX-EN-Y GASTRIC BYPASS WITH UPPER ENDOSCOPY   Discussed 2 week post op diet with pt. Emphasized that liquids must be non carbonated, non caffeinated, and sugar free. Fluid goals discussed. Pt to follow up with outpatient bariatric RD for further diet progression after 2 weeks. Multivitamins and minerals also reviewed. Teach back method used, pt expressed understanding, expect good compliance.   Diet: First 2 Weeks  You will see the nutritionist about two (2) weeks after your surgery. The nutritionist will increase the types of foods you can eat if you are handling liquids well:  If you have severe vomiting or nausea and cannot handle clear liquids lasting longer than 1 day, call your surgeon  Protein Shake  Drink at least 2 ounces of shake 5-6 times per day  Each serving of protein shakes (usually 8 - 12 ounces) should have a minimum of:  15 grams of protein  And no more than 5 grams of carbohydrate  Goal for protein each day:  Men = 80 grams per day  Women = 60 grams per day  Protein powder may be added to fluids such as non-fat milk or Lactaid milk or Soy milk (limit to 35 grams added protein powder per serving)   Hydration  Slowly increase the amount of water and other clear liquids as tolerated (See Acceptable Fluids)  Slowly increase the amount of protein shake as tolerated  Sip fluids slowly and throughout the day  May use sugar substitutes in small amounts (no more than 6 - 8 packets per day; i.e. Splenda)   Fluid Goal  The first goal is  to drink at least 8 ounces of protein shake/drink per day (or as directed by the nutritionist); some examples of protein shakes are Johnson & Johnson, AMR Corporation, EAS Edge HP, and Unjury. See handout from pre-op Bariatric Education Class:  Slowly increase the amount of protein shake you drink as tolerated  You may find it easier to slowly sip shakes throughout the day  It is important to get your proteins in first  Your fluid goal is to drink 64 - 100 ounces of fluid daily  It may take a few weeks to build up to this  32 oz (or more) should be clear liquids  And  32 oz (or more) should be full liquids (see below for examples)  Liquids should not contain sugar, caffeine, or carbonation   Clear Liquids:  Water or Sugar-free flavored water (i.e. Fruit H2O, Propel)  Decaffeinated coffee or tea (sugar-free)  Crystal Lite, Wyler's Lite, Minute Maid Lite  Sugar-free Jell-O  Bouillon or broth  Sugar-free Popsicle: *Less than 20 calories each; Limit 1 per day   Full Liquids:  Protein Shakes/Drinks + 2 choices per day of other full liquids  Full liquids must be:  No More Than 12 grams of Carbs per serving  No More Than 3 grams of Fat per serving  Strained low-fat cream soup  Non-Fat milk  Fat-free Lactaid Milk  Sugar-free yogurt (Dannon Lite & Fit, Greek yogurt)     Holly Bibles, MS, RD, LDN Pager: [phone] After Hours Pager: [phone]

## 2014-03-16 NOTE — Progress Notes (Signed)
General Surgery Note  LOS: 2 days  POD -  2 Days Post-Op  Assessment/Plan: 1.  LAPAROSCOPIC ROUX-EN-Y GASTRIC BYPASS WITH UPPER ENDOSCOPY - 03/14/2014 - D. Joseguadalupe Stan  Morbid obesity (weight 282, BMI of 44.2).  Swallow okay  To advance diet  Because of urinary retention, I will keep at least until tomorrow.  2.  Urinary retention  Will try foley out 3. Chronic pain meds   Though she is only using them about once a week.  4. Arthritic Lupus  5. Osteoarthritis  6.  DVT prophylaxis - SQ Heparin   Active Problems:   Morbid obesity, Initial weight - 282, BMI - 42.7  Subjective:  Doing better today.  She walked around the hall x 9 yesterday. By herself in the room. Objective:   Filed Vitals:   03/16/14 0205  BP: 151/80  Pulse: 91  Temp: 99.9 F (37.7 C)  Resp: 16     Intake/Output from previous day:  10/28 0701 - 10/29 0700 In: 5500 [I.V.:5400; IV Piggyback:100] Out: 4050 [Urine:4050]  Intake/Output this shift:  Total I/O In: 1910 [I.V.:1910] Out: 1250 [Urine:1250]   Physical Exam:   General: Obese AA F who is alert and oriented.    HEENT: Normal. Pupils equal. .   Lungs: Clear.  IS - 1,400 cc.   Abdomen: Soft   Wound: Clean     Lab Results:    Recent Labs  03/15/14 0353 03/15/14 1547 03/16/14 0508  WBC 10.1  --  9.2  HGB 11.2* 11.6* 11.1*  HCT 35.6* 37.3 35.5*  PLT 276  --  249    BMET  No results found for this basename: NA, K, CL, CO2, GLUCOSE, BUN, CREATININE, CALCIUM,  in the last 72 hours  PT/INR  No results found for this basename: LABPROT, INR,  in the last 72 hours  ABG  No results found for this basename: PHART, PCO2, PO2, HCO3,  in the last 72 hours   Studies/Results:  Dg Ugi W/water Sol Cm  03/15/2014   CLINICAL DATA:  Gastric bypass.  EXAM: WATER SOLUBLE UPPER GI SERIES  TECHNIQUE: Single-column upper GI series was performed using water soluble contrast.  CONTRAST:  50mL OMNIPAQUE IOHEXOL 300 MG/ML  SOLN  COMPARISON:  None.  FLUOROSCOPY  TIME:  1 min 12 seconds  FINDINGS: Esophagus is unremarkable. Gastric remnant is unremarkable. The gastroenteric anastomosis is widely patent. Normal gastric emptying. No leak.  IMPRESSION: Normal exam.   Electronically Signed   By: Maisie Fushomas  Register   On: 03/15/2014 14:39     Anti-infectives:   Anti-infectives   Start     Dose/Rate Route Frequency Ordered Stop   03/14/14 0600  cefOXitin (MEFOXIN) 2 g in dextrose 5 % 50 mL IVPB     2 g 100 mL/hr over 30 Minutes Intravenous On call to O.R. 03/14/14 0522 03/14/14 0900   03/14/14 0530  cefOXitin (MEFOXIN) 2 g in dextrose 5 % 50 mL IVPB  Status:  Discontinued     2 g 100 mL/hr over 30 Minutes Intravenous  Once 03/14/14 0520 03/14/14 0532      Ovidio Kinavid Sim Choquette, MD, FACS Pager: [phone] [facility] Office: [phone] 03/16/2014

## 2014-03-16 NOTE — Progress Notes (Signed)
Patient alert and oriented, Post op day 2.  Provided support and encouragement.  Encouraged pulmonary toilet, ambulation and small sips of liquids.  All questions answered.  Will continue to monitor. 

## 2014-03-17 ENCOUNTER — Telehealth (HOSPITAL_COMMUNITY): Payer: Self-pay

## 2014-03-17 NOTE — Progress Notes (Signed)
Patient alert and oriented, pain is controlled. Patient is tolerating fluids, advanced to protein shake yesterday, patient tolerating well. Reviewed Gastric Bypass discharge instructions with patient and patient is able to articulate understanding. Provided information on BELT program, Support Group and [facility] outpatient pharmacy. All questions answered, will continue to monitor.

## 2014-03-17 NOTE — Discharge Summary (Signed)
Physician Discharge Summary  Patient ID:  Holly Davidson  MRN: [id number]  DOB/AGE: 1966/04/25 48 y.o.  Admit date: 03/14/2014 Discharge date: 03/17/2014  Discharge Diagnoses:  1.  Morbid obesity (weight 282, BMI of 44.2).   2. Urinary retention - required foley for 48 hours 3. Chronic pain meds   Though she is only using them about once a week.  4. Arthritic Lupus  5. Osteoarthritis   Active Problems:   Morbid obesity, Initial weight - 282, BMI - 42.7  Operation: Procedure(s): LAPAROSCOPIC ROUX-EN-Y GASTRIC BYPASS WITH UPPER ENDOSCOPY on 03/14/2014 - D. Ezzard Standing  Discharged Condition: good  Hospital Course: Holly Davidson is an 48 y.o. female whose primary care physician is HOUT, Lowanda Foster, PA-C and who was admitted 03/14/2014 with a chief complaint of morbid obesity.   She was brought to the operating room on 03/14/2014 and underwent  LAPAROSCOPIC ROUX-EN-Y GASTRIC BYPASS WITH UPPER ENDOSCOPY.   She had a lot of pain early and had urinary retention requiring a foley catheter.  I left the foley in for 48 hours, then removed it and she did well. She is now 3 days post op, taking po's well, voiding well, her pain is under control and she is ready to go home. Her husband is in the room with her.  The discharge instructions were reviewed with the patient.  Consults: None  Significant Diagnostic Studies: Results for orders placed during the hospital encounter of 03/14/14  CBC WITH DIFFERENTIAL      Result Value Ref Range   WBC 5.4  4.0 - 10.5 K/uL   RBC 4.67  3.87 - 5.11 MIL/uL   Hemoglobin 11.2 (*) 12.0 - 15.0 g/dL   HCT 40.9 (*) 81.1 - 91.4 %   MCV 74.7 (*) 78.0 - 100.0 fL   MCH 24.0 (*) 26.0 - 34.0 pg   MCHC 32.1  30.0 - 36.0 g/dL   RDW 78.2 (*) 95.6 - 21.3 %   Platelets 270  150 - 400 K/uL   Neutrophils Relative % 46  43 - 77 %   Neutro Abs 2.5  1.7 - 7.7 K/uL   Lymphocytes Relative 40  12 - 46 %   Lymphs Abs 2.1  0.7 - 4.0 K/uL   Monocytes Relative 10  3 - 12 %    Monocytes Absolute 0.6  0.1 - 1.0 K/uL   Eosinophils Relative 4  0 - 5 %   Eosinophils Absolute 0.2  0.0 - 0.7 K/uL   Basophils Relative 0  0 - 1 %   Basophils Absolute 0.0  0.0 - 0.1 K/uL  HEMOGLOBIN AND HEMATOCRIT, BLOOD      Result Value Ref Range   Hemoglobin 12.0  12.0 - 15.0 g/dL   HCT 08.6  57.8 - 46.9 %  CBC WITH DIFFERENTIAL      Result Value Ref Range   WBC 10.1  4.0 - 10.5 K/uL   RBC 4.78  3.87 - 5.11 MIL/uL   Hemoglobin 11.2 (*) 12.0 - 15.0 g/dL   HCT 62.9 (*) 52.8 - 41.3 %   MCV 74.5 (*) 78.0 - 100.0 fL   MCH 23.4 (*) 26.0 - 34.0 pg   MCHC 31.5  30.0 - 36.0 g/dL   RDW 24.4 (*) 01.0 - 27.2 %   Platelets 276  150 - 400 K/uL   Neutrophils Relative % 75  43 - 77 %   Neutro Abs 7.7  1.7 - 7.7 K/uL   Lymphocytes Relative 16  12 -  46 %   Lymphs Abs 1.6  0.7 - 4.0 K/uL   Monocytes Relative 9  3 - 12 %   Monocytes Absolute 0.9  0.1 - 1.0 K/uL   Eosinophils Relative 0  0 - 5 %   Eosinophils Absolute 0.0  0.0 - 0.7 K/uL   Basophils Relative 0  0 - 1 %   Basophils Absolute 0.0  0.0 - 0.1 K/uL  HEMOGLOBIN AND HEMATOCRIT, BLOOD      Result Value Ref Range   Hemoglobin 11.6 (*) 12.0 - 15.0 g/dL   HCT 40.937.3  81.136.0 - 91.446.0 %  CBC WITH DIFFERENTIAL      Result Value Ref Range   WBC 9.2  4.0 - 10.5 K/uL   RBC 4.72  3.87 - 5.11 MIL/uL   Hemoglobin 11.1 (*) 12.0 - 15.0 g/dL   HCT 78.235.5 (*) 95.636.0 - 21.346.0 %   MCV 75.2 (*) 78.0 - 100.0 fL   MCH 23.5 (*) 26.0 - 34.0 pg   MCHC 31.3  30.0 - 36.0 g/dL   RDW 08.616.5 (*) 57.811.5 - 46.915.5 %   Platelets 249  150 - 400 K/uL   Neutrophils Relative % 70  43 - 77 %   Neutro Abs 6.5  1.7 - 7.7 K/uL   Lymphocytes Relative 13  12 - 46 %   Lymphs Abs 1.2  0.7 - 4.0 K/uL   Monocytes Relative 16 (*) 3 - 12 %   Monocytes Absolute 1.4 (*) 0.1 - 1.0 K/uL   Eosinophils Relative 1  0 - 5 %   Eosinophils Absolute 0.1  0.0 - 0.7 K/uL   Basophils Relative 0  0 - 1 %   Basophils Absolute 0.0  0.0 - 0.1 K/uL    Dg Ugi W/water Sol Cm  03/15/2014   CLINICAL  DATA:  Gastric bypass.  EXAM: WATER SOLUBLE UPPER GI SERIES  TECHNIQUE: Single-column upper GI series was performed using water soluble contrast.  CONTRAST:  50mL OMNIPAQUE IOHEXOL 300 MG/ML  SOLN  COMPARISON:  None.  FLUOROSCOPY TIME:  1 min 12 seconds  FINDINGS: Esophagus is unremarkable. Gastric remnant is unremarkable. The gastroenteric anastomosis is widely patent. Normal gastric emptying. No leak.  IMPRESSION: Normal exam.   Electronically Signed   By: Maisie Fushomas  Register   On: 03/15/2014 14:39    Discharge Exam:  Ceasar MonsFiled Vitals:   03/17/14 0529  BP: 113/75  Pulse: 73  Temp: 98.9 F (37.2 C)  Resp: 18    General: Obese AA F who is alert.  Lungs: Clear to auscultation and symmetric breath sounds. Heart:  RRR. No murmur or rub. Abdomen: Soft.  Normal bowel sounds. Her incisions look good.  Discharge Medications:     Medication List    ASK your doctor about these medications       acetaminophen 500 MG tablet  Commonly known as:  TYLENOL  Take 1,000 mg by mouth every 6 (six) hours as needed for mild pain or moderate pain.     CALTRATE 600+D PLUS PO  Take 1 tablet by mouth every morning.     clonazePAM 0.5 MG tablet  Commonly known as:  KLONOPIN  Take 0.5 mg by mouth daily as needed for anxiety.     cyclobenzaprine 10 MG tablet  Commonly known as:  FLEXERIL  Take 10 mg by mouth 2 (two) times daily as needed for muscle spasms.     diphenhydrAMINE 25 mg capsule  Commonly known as:  BENADRYL  Take 25 mg  by mouth at bedtime as needed for sleep.     escitalopram 20 MG tablet  Commonly known as:  LEXAPRO  Take 20 mg by mouth at bedtime.     furosemide 20 MG tablet  Commonly known as:  LASIX  Take 20 mg by mouth daily as needed for fluid.     multivitamin with minerals Tabs tablet  Take 1 tablet by mouth every morning.     Oxycodone HCl 10 MG Tabs  Take 10 mg by mouth 4 (four) times daily as needed (for pain).     polyvinyl alcohol 1.4 % ophthalmic solution  Commonly  known as:  LIQUIFILM TEARS  Place 2 drops into both eyes daily as needed for dry eyes.     traZODone 100 MG tablet  Commonly known as:  DESYREL  Take 100 mg by mouth at bedtime.        Disposition: 01-Home or Self Care   Activity:  Driving - Do not drive until you are off most of the pain meds.   Lifting - Take it easy for 10 days, then no limit  Wound Care:   May shower  Diet:  Gastric bypass diet  Follow up appointment:  Call Dr. Allene PyoNewman's office [facility]) at [phone] for an appointment in 2 to 3 weeks  Medications and dosages:  Resume your home medications.  You have a prescription for:  Oxycodone elixir  Signed: Ovidio Kinavid Shakeitha Umbaugh, M.D., [facility] Office:  [phone]  03/17/2014, 8:34 AM

## 2014-03-17 NOTE — Discharge Instructions (Signed)
[facility] - DISCHARGE INSTRUCTIONS TO PATIENT  Activity:  Driving - Do not drive until you are off most of the pain meds.   Lifting - Take it easy for 10 days, then no limit  Wound Care:   May shower  Diet:  Gastric bypass diet  Follow up appointment:  Call Dr. Allene PyoNewman's office [facility]) at [phone] for an appointment in 2 to 3 weeks  Medications and dosages:  Resume your home medications.  You have a prescription for:  Oxycodone elixir  Call Dr. Ezzard StandingNewman or his office  [phone]) if you have:  Temperature greater than 100.4,  Persistent nausea and vomiting,  Severe uncontrolled pain,  Redness, tenderness, or signs of infection (pain, swelling, redness, odor or green/yellow discharge around the site),  Difficulty breathing, headache or visual disturbances,  Any other questions or concerns you may have after discharge.  In an emergency, call 911 or go to an Emergency Department at a nearby hospital.       GASTRIC BYPASS/SLEEVE  Home Care Instructions   These instructions are to help you care for yourself when you go home.  Call: If you have any problems. Call [phone] and ask for the surgeon on call If you need immediate assistance [facility] Tell the ER staff you are a new post-op gastric bypass or gastric sleeve patient  Signs and symptoms to report: Severe  vomiting or nausea If you cannot handle clear liquids for longer than 1 day, call your surgeon Abdominal pain which does not get better after taking your pain medication Fever greater than 100.4  F and chills Heart rate over 100 beats a minute Trouble breathing Chest pain Redness,  swelling, drainage, or foul odor at incision (surgical) sites If your incisions open or pull apart Swelling or pain in calf (lower leg) Diarrhea (Loose bowel movements that happen often), frequent watery, uncontrolled bowel movements Constipation, (no bowel movements for 3  days) if this happens: Take Milk of Magnesia, 2 tablespoons by mouth, 3 times a day for 2 days if needed Stop taking Milk of Magnesia once you have had a bowel movement Call your doctor if constipation continues Or Take Miralax  (instead of Milk of Magnesia) following the label instructions Stop taking Miralax once you have had a bowel movement Call your doctor if constipation continues Anything you think is abnormal for you   Normal side effects after surgery: Unable to sleep at night or unable to concentrate Irritability Being tearful (crying) or depressed  These are common complaints, possibly related to your anesthesia, stress of surgery, and change in lifestyle, that usually go away a few weeks after surgery. If these feelings continue, call your medical doctor.  Wound Care: You may have surgical glue, steri-strips, or staples over your incisions after surgery Surgical glue: Looks like clear film over your incisions and will wear off a little at a time Steri-strips: Adhesive strips of tape over your incisions. You may notice a yellowish color on skin under the steri-strips. This is used to make the steri-strips stick better. Do not pull the steri-strips off - let them fall off Staples: Staples may be removed before you leave the hospital If you go home with staples, call [facility] for an appointment with your surgeons nurse to have staples removed 10 days after surgery, [phone] Showering: You may shower two (2) days after your surgery unless your surgeon tells you differently Wash gently around incisions with  warm soapy water, rinse well, and gently pat dry If you have a drain (tube from your incision), you may need someone to hold this while you shower No tub baths until staples are removed and incisions are healed   Medications: Medications should be liquid or crushed if larger than the size of a dime Extended release pills (medication that releases a little  bit at a time through the  day) should not be crushed Depending on the size and number of medications you take, you may need to space (take a few throughout the day)/change the time you take your medications so that you do not over-fill your pouch (smaller stomach) Make sure you follow-up with you primary care physician to make medication changes needed during rapid weight loss and life -style changes If you have diabetes, follow up with your doctor that orders your diabetes medication(s) within one week after surgery and check your blood sugar regularly  Do not drive while taking narcotics (pain medications)  Do not take acetaminophen (Tylenol) and Roxicet or Lortab Elixir at the same time since these pain medications contain acetaminophen   Diet:  First 2 Weeks You will see the nutritionist about two (2) weeks after your surgery. The nutritionist will increase the types of foods you can eat if you are handling liquids well: If you have severe vomiting or nausea and cannot handle clear liquids lasting longer than 1 day call your surgeon Protein Shake Drink at least 2 ounces of shake 5-6 times per day Each serving of protein shakes (usually 8-12 ounces) should have a minimum of: 15 grams of protein And no more than 5 grams of carbohydrate Goal for protein each day: Men = 80 grams per day Women = 60 grams per day    Protein powder may be added to fluids such as non-fat milk or Lactaid milk or Soy milk (limit to 35 grams added protein powder per serving)  Hydration Slowly increase the amount of water and other clear liquids as tolerated (See Acceptable Fluids) Slowly increase the amount of protein shake as tolerated Sip fluids slowly and throughout the day May use sugar substitutes in small amounts (no more than 6-8 packets per day; i.e. Splenda)  Fluid Goal The first goal is to drink at least 8 ounces of protein shake/drink per day (or as directed by the nutritionist); some examples of  protein shakes are Johnson & Johnson, AMR Corporation, EAS Edge HP, and Unjury. - See handout from pre-op Bariatric Education Class: Slowly increase the amount of protein shake you drink as tolerated You may find it easier to slowly sip shakes throughout the day It is important to get your proteins in first Your fluid goal is to drink 64-100 ounces of fluid daily It may take a few weeks to build up to this  32 oz. (or more) should be clear liquids And 32 oz. (or more) should be full liquids (see below for examples) Liquids should not contain sugar, caffeine, or carbonation  Clear Liquids: Water of Sugar-free flavored water (i.e. Fruit HO, Propel) Decaffeinated coffee or tea (sugar-free) Crystal lite, Wylers Lite, Minute Maid Lite Sugar-free Jell-O Bouillon or broth Sugar-free Popsicle:    - Less than 20 calories each; Limit 1 per day  Full Liquids:                   Protein Shakes/Drinks + 2 choices per day of other full liquids Full liquids must be: No More Than 12 grams of Carbs per  serving No More Than 3 grams of Fat per serving Strained low-fat cream soup Non-Fat milk Fat-free Lactaid Milk Sugar-free yogurt (Dannon Lite & Fit, Greek yogurt)    Vitamins and Minerals Start 1 day after surgery unless otherwise directed by your surgeon 2 Chewable Multivitamin / Multimineral Supplement with iron (i.e. Centrum for Adults) Vitamin B-12, 350-500 micrograms sub-lingual (place tablet under the tongue) each day Chewable Calcium Citrate with Vitamin D-3 (Example: 3 Chewable Calcium  Plus 600 with Vitamin D-3) Take 500 mg three (3) times a day for a total of 1500 mg each day Do not take all 3 doses of calcium at one time as it may cause constipation, and you can only absorb 500 mg at a time Do not mix multivitamins containing iron with calcium supplements;  take 2 hours apart Do not substitute Tums (calcium carbonate) for your calcium Menstruating women and those at risk for anemia ( a  blood disease that causes weakness) may need extra iron Talk to your doctor to see if you need more iron If you need extra iron: Total daily Iron recommendation (including Vitamins) is 50 to 100 mg Iron/day Do not stop taking or change any vitamins or minerals until you talk to your nutritionist or surgeon Your nutritionist and/or surgeon must approve all vitamin and mineral supplements   Activity and Exercise: It is important to continue walking at home. Limit your physical activity as instructed by your doctor. During this time, use these guidelines: Do not lift anything greater than ten  (10) pounds for at least two (2) weeks Do not go back to work or drive until Engineer, production says you can You may have sex when you feel comfortable It is VERY important for female patients to use a reliable birth control method; fertility often increase after surgery Do not get pregnant for at least 18 months Start exercising as soon as your doctor tells you that you can Make sure your doctor approves any physical activity Start with a simple walking program Walk 5-15 minutes each day, 7 days per week Slowly increase until you are walking 30-45 minutes per day Consider joining our [place] program. [phone] or email [email or web address]   Special Instructions Things to remember: Free counseling is available for you and your family through collaboration between [facility] and [place]. Please call [phone] and leave a message Use your CPAP when sleeping if this applies to you Consider buying a medical alert bracelet that says you had lap-band surgery     You will likely have your first fill (fluid added to your band) 6 - 8 weeks after surgery [facility] has a free Bariatric Surgery Support Group that meets monthly, the 3rd Thursday, [place]. You can see classes online at [email or web address] It is very important to keep all follow up appointments with your  surgeon, nutritionist, primary care physician, and behavioral health practitioner After the first year, please follow up with your bariatric surgeon and nutritionist at least once a year in order to maintain best weight loss results                    [facility]:  [place]: [phone]               Bariatric Nurse Coordinator: [phone]  Gastric Bypass/Sleeve Home Care Instructions  Rev. 06/2012  Reviewed and Endorsed °                                                   by [facility] Patient Education Committee, Jan, 2014 ° ° ° ° ° ° ° ° ° °

## 2014-03-20 NOTE — Telephone Encounter (Signed)
Made discharge phone call to patient per DROP protocol. Asking the following questions.    1. Do you have someone to care for you now that you are home?  YES; no concerning issues to report 2. Are you having pain now that is not relieved by your pain medication?  NO;  no concerning issues to report 3. Are you able to drink the recommended daily amount of fluids (48 ounces minimum/day) and protein (60-80 grams/day) as prescribed by the dietitian or nutritional counselor?  No per patient but reports she is still trying. Advised that she is having trouble getting the shakes down but reports doing better with warm broth. Although successful with yogurt & popsicles. Encouraged patient to make efforts to get down recommended fluid/protein & possibly try something like the Unjury chicken soup vs. Protein shakes to get the needed protein. Instructed patient to call the Bariatric office if issue continues or gets worse.    4. Are you taking the vitamins and minerals as prescribed?  Reports no trouble with Vit B12, but is having issues with getting down the particular multi-vit that she purchased from a specific Bariatric Website. May try to switch brands, but if not able to do so & continued difficulty, has been instructed to report back to our office.  5. Do you have the on call number to contact your surgeon if you have a problem or question?  YES 6. Are your incisions free of redness, swelling or drainage? (If steri strips, address that these can fall off, shower as tolerated) Yes, no concerning issues to report.  7. Have your bowels moved since your surgery?  If not, are you passing gas?  Yes to both, no concerning issues to report.  8. Are you up and walking 3-4 times per day?  Yes & reports that she is starting a walking group tomorrow.   1. Do you have an appointment made to see your surgeon in the next month?  Yes. Scheduled appt with [facility] on 03/30/14 2. Were you provided your discharge medications before  your surgery or before you were discharged from the hospital and are you taking them without problem?  Yes 3. Were you provided phone numbers to the clinic/surgeons office?  Yes 4. Did you watch the patient education video module in the (clinic, surgeons office, etc.) before your surgery? Yes in Pre-op class on 01/09/14 5. Do you have a discharge checklist that was provided to you in the hospital to reference with instructions on how to take care of yourself after surgery?  Yes 6. Did you see a dietitian or nutritional counselor while you were in the hospital?  Yes 7.   Do you have an appointment to see a dietitian or nutritional counselor in the next month?  Yes, [facility]

## 2014-03-28 ENCOUNTER — Encounter: Payer: BC Managed Care – PPO | Attending: Surgery

## 2014-03-28 DIAGNOSIS — Z6841 Body Mass Index (BMI) 40.0 and over, adult: Secondary | ICD-10-CM | POA: Diagnosis not present

## 2014-03-28 DIAGNOSIS — Z713 Dietary counseling and surveillance: Secondary | ICD-10-CM | POA: Diagnosis not present

## 2014-03-28 NOTE — Patient Instructions (Signed)
Patient to follow Phase 3A-Soft, High Protein Diet and follow-up at [facility] in 6 weeks for 2 months post-op nutrition visit for diet advancement. 

## 2014-03-28 NOTE — Progress Notes (Signed)
Bariatric Class:  Appt start time: 1530 end time:  1630.  2 Week Post-Operative Nutrition Class  Patient was seen on 03/28/14 for Post-Operative Nutrition education at [facility].   Surgery date: 01/30/2014 Surgery type: RYGB Start weight at [facility]: 294 lbs on 10/14/2013 Weight today: 264lbs  Weight change: 21 lbs Total weight loss: 30 lbs  TANITA  BODY COMP RESULTS  01/09/14 03/28/14   BMI (kg/m^2) 45.3 42.0   Fat Mass (lbs) 160 147.0   Fat Free Mass (lbs) 125 117.0   Total Body Water (lbs) 91.5 85.5    The following the learning objectives were met by the patient during this course:  Identifies Phase 3A (Soft, High Proteins) Dietary Goals and will begin from 2 weeks post-operatively to 2 months post-operatively  Identifies appropriate sources of fluids and proteins   States protein recommendations and appropriate sources post-operatively  Identifies the need for appropriate texture modifications, mastication, and bite sizes when consuming solids  Identifies appropriate multivitamin and calcium sources post-operatively  Describes the need for physical activity post-operatively and will follow MD recommendations  States when to call healthcare provider regarding medication questions or post-operative complications  Handouts given during class include:  Phase 3A: Soft, High Protein Diet Handout  Follow-Up Plan: Patient will follow-up at [facility] in 6 weeks for 2 month post-op nutrition visit for diet advancement per MD.

## 2014-03-30 ENCOUNTER — Other Ambulatory Visit (INDEPENDENT_AMBULATORY_CARE_PROVIDER_SITE_OTHER): Payer: Self-pay | Admitting: Surgery

## 2014-03-30 DIAGNOSIS — K909 Intestinal malabsorption, unspecified: Secondary | ICD-10-CM

## 2014-03-30 DIAGNOSIS — Z9884 Bariatric surgery status: Secondary | ICD-10-CM

## 2014-05-08 ENCOUNTER — Encounter: Payer: BC Managed Care – PPO | Attending: Surgery | Admitting: Dietician

## 2014-05-08 DIAGNOSIS — Z713 Dietary counseling and surveillance: Secondary | ICD-10-CM | POA: Diagnosis not present

## 2014-05-08 DIAGNOSIS — Z6841 Body Mass Index (BMI) 40.0 and over, adult: Secondary | ICD-10-CM | POA: Diagnosis not present

## 2014-05-08 LAB — CBC WITH DIFFERENTIAL/PLATELET
BASOS PCT: 0 % (ref 0–1)
Basophils Absolute: 0 10*3/uL (ref 0.0–0.1)
EOS ABS: 0.2 10*3/uL (ref 0.0–0.7)
Eosinophils Relative: 5 % (ref 0–5)
HCT: 38.8 % (ref 36.0–46.0)
Hemoglobin: 12.4 g/dL (ref 12.0–15.0)
Lymphocytes Relative: 38 % (ref 12–46)
Lymphs Abs: 1.7 10*3/uL (ref 0.7–4.0)
MCH: 23.4 pg — AB (ref 26.0–34.0)
MCHC: 32 g/dL (ref 30.0–36.0)
MCV: 73.2 fL — ABNORMAL LOW (ref 78.0–100.0)
Monocytes Absolute: 0.5 10*3/uL (ref 0.1–1.0)
Monocytes Relative: 11 % (ref 3–12)
NEUTROS PCT: 46 % (ref 43–77)
Neutro Abs: 2.1 10*3/uL (ref 1.7–7.7)
PLATELETS: 317 10*3/uL (ref 150–400)
RBC: 5.3 MIL/uL — ABNORMAL HIGH (ref 3.87–5.11)
RDW: 18.9 % — AB (ref 11.5–15.5)
WBC: 4.6 10*3/uL (ref 4.0–10.5)

## 2014-05-08 NOTE — Patient Instructions (Addendum)
Goals:  Follow Phase 3B: High Protein + Non-Starchy Vegetables  Eat 3-6 small meals/snacks, every 3-5 hrs  Increase lean protein foods to meet 60g goal  Increase fluid intake to 64oz +  Avoid drinking 15 minutes before, during and 30 minutes after eating  Aim for >30 min of physical activity daily (work up to 30 minutes)  Try to add one protein snack (beans, cheese, jerky, cottage cheese, deli meat  Add separate calcium  Surgery date: 01/30/2014 Surgery type: RYGB [facility]: [facility] lbs on 10/14/2013 Weight today: 248.0 lbs Weight change: 16 lbs Total weight loss: 46 lbs  TANITA  BODY COMP RESULTS  01/09/14 03/28/14 05/08/14   BMI (kg/m^2) 45.3 42.0 39.4   Fat Mass (lbs) 160 147.0 135.5   Fat Free Mass (lbs) 125 117.0 112.5   Total Body Water (lbs) 91.5 85.5 82.5

## 2014-05-08 NOTE — Progress Notes (Signed)
  Follow-up visit:  12 Weeks Post-Operative RYGB Surgery  Medical Nutrition Therapy:  Appt start time: 1035 end time:  1100.  Primary concerns today: Post-operative Bariatric Surgery Nutrition Management. Returns with a 16 lbs weight loss. Has not been able to walk d/t Lupus flare. She is tolerating the foods that she is eating.   She is not interested in food as much as before. Not completely skipping but just having a little bit. Had few days where she just did liquid cream of chicken soup.   Surgery date: 01/30/2014 Surgery type: [facility]YGB [facility] lbs on 10/14/2013 Weight today: 248.0 lbs  Weight change: 16 lbs Total weight loss: 46 lbs  TANITA  BODY COMP RESULTS  01/09/14 03/28/14 05/08/14   BMI (kg/m^2) 45.3 42.0 39.4   Fat Mass (lbs) 160 147.0 135.5   Fat Free Mass (lbs) 125 117.0 112.5   Total Body Water (lbs) 91.5 85.5 82.5     Preferred Learning Style:   No preference indicated   Learning Readiness:   Ready  24-hr recall: B (AM): Premier Protein or egg white with cheese (12-30 g)   Snk (AM): none L (PM): 2 oz baked chicken (14 g)  Snk (PM): none D (PM): 2  Oz grilled fish or chicken with green beans or zucchini (14 g) Snk (PM): none  Fluid intake: 11 oz protein shake, 51-68 Wyler's Light oz Estimated total protein intake: 40-58 g  Medications: taking Supplementation: taking, needs to add calcium  Using straws: No Drinking while eating: No Hair loss: No Carbonated beverages: No N/V/D/C: has some constipation, taking Milk of Magnesia when needed Dumping syndrome: No  Recent physical activity:  Very little d/t Lupus  Progress Towards Goal(s):  In progress.  Handouts given during visit include:  Phase 3B High Protein + Non Starchy Vegetables   Nutritional Diagnosis:  Bee-3.3 Overweight/obesity related to past poor dietary habits and physical inactivity as evidenced by patient w/ recent RYGB surgery following dietary guidelines for continued  weight loss.    Intervention:  Nutrition education/diet advancement. Goals:  Follow Phase 3B: High Protein + Non-Starchy Vegetables  Eat 3-6 small meals/snacks, every 3-5 hrs  Increase lean protein foods to meet 60g goal  Increase fluid intake to 64oz +  Avoid drinking 15 minutes before, during and 30 minutes after eating  Aim for >30 min of physical activity daily (work up to 30 minutes)  Try to add one protein snack (beans, cheese, jerky, cottage cheese, deli meat  Add separate calcium  Teaching Method Utilized:  Visual Auditory Hands on  Barriers to learning/adherence to lifestyle change: none  Demonstrated degree of understanding via:  Teach Back   Monitoring/Evaluation:  Dietary intake, exercise, and body weight. Follow up in 1 months for 13 month post-op visit.

## 2014-05-09 LAB — COMPLETE METABOLIC PANEL WITH GFR
ALBUMIN: 3.9 g/dL (ref 3.5–5.2)
ALT: 14 U/L (ref 0–35)
AST: 18 U/L (ref 0–37)
Alkaline Phosphatase: 53 U/L (ref 39–117)
BUN: 8 mg/dL (ref 6–23)
CALCIUM: 9.5 mg/dL (ref 8.4–10.5)
CO2: 30 mEq/L (ref 19–32)
Chloride: 103 mEq/L (ref 96–112)
Creat: 0.58 mg/dL (ref 0.50–1.10)
GFR, Est African American: >89 mL/min
GLUCOSE: 91 mg/dL (ref 70–99)
POTASSIUM: 4.5 meq/L (ref 3.5–5.3)
SODIUM: 140 meq/L (ref 135–145)
TOTAL PROTEIN: 7 g/dL (ref 6.0–8.3)
Total Bilirubin: 0.4 mg/dL (ref 0.2–1.2)

## 2014-05-09 LAB — CHOLESTEROL, TOTAL: Cholesterol: 136 mg/dL (ref 0–200)

## 2014-05-09 LAB — VITAMIN B12: Vitamin B-12: 781 pg/mL (ref 211–911)

## 2014-05-09 LAB — IRON AND TIBC
%SAT: 11 % — ABNORMAL LOW (ref 20–55)
Iron: 33 ug/dL — ABNORMAL LOW (ref 42–145)
TIBC: 313 ug/dL (ref 250–470)
UIBC: 280 ug/dL (ref 125–400)

## 2014-05-09 LAB — MAGNESIUM: Magnesium: 1.9 mg/dL (ref 1.5–2.5)

## 2014-05-09 LAB — FOLATE

## 2014-05-13 LAB — VITAMIN B1: Vitamin B1 (Thiamine): 10 nmol/L (ref 8–30)

## 2014-06-12 ENCOUNTER — Ambulatory Visit: Payer: BC Managed Care – PPO | Admitting: Dietician

## 2014-06-26 ENCOUNTER — Ambulatory Visit: Payer: Self-pay | Admitting: Dietician

## 2016-02-14 ENCOUNTER — Telehealth (HOSPITAL_COMMUNITY): Payer: Self-pay | Admitting: *Deleted

## 2016-02-14 DIAGNOSIS — M359 Systemic involvement of connective tissue, unspecified: Secondary | ICD-10-CM

## 2016-02-14 NOTE — Telephone Encounter (Signed)
Received referral from Dr Corliss Skainseveshwar, per Dr Gala RomneyBensimhon pt will need new pt eval, echo and pft's for connective tissue disease  Orders placed

## 2016-02-18 ENCOUNTER — Telehealth (HOSPITAL_COMMUNITY): Payer: Self-pay | Admitting: Vascular Surgery

## 2016-02-18 NOTE — Telephone Encounter (Signed)
Pt voicemail is full, will send appt dates and times in mail

## 2016-02-20 ENCOUNTER — Encounter (HOSPITAL_COMMUNITY): Payer: Self-pay

## 2016-03-13 ENCOUNTER — Ambulatory Visit (HOSPITAL_COMMUNITY): Admission: RE | Admit: 2016-03-13 | Payer: BLUE CROSS/BLUE SHIELD | Source: Ambulatory Visit

## 2016-03-13 ENCOUNTER — Inpatient Hospital Stay (HOSPITAL_COMMUNITY)
Admission: RE | Admit: 2016-03-13 | Discharge: 2016-03-13 | Disposition: A | Discharge status: Home / Self Care | Payer: Self-pay | Source: Ambulatory Visit | Attending: Internal Medicine | Admitting: Internal Medicine

## 2016-03-13 ENCOUNTER — Telehealth (HOSPITAL_COMMUNITY): Payer: Self-pay | Admitting: Vascular Surgery

## 2016-03-13 ENCOUNTER — Encounter (HOSPITAL_COMMUNITY): Payer: Self-pay

## 2016-03-19 NOTE — Telephone Encounter (Signed)
Encounter opened in error

## 2016-03-28 ENCOUNTER — Encounter: Payer: Self-pay | Admitting: Rheumatology

## 2016-03-28 DIAGNOSIS — M1712 Unilateral primary osteoarthritis, left knee: Secondary | ICD-10-CM | POA: Insufficient documentation

## 2016-03-28 DIAGNOSIS — M2242 Chondromalacia patellae, left knee: Principal | ICD-10-CM

## 2016-03-28 DIAGNOSIS — M170 Bilateral primary osteoarthritis of knee: Secondary | ICD-10-CM

## 2016-03-28 DIAGNOSIS — M51369 Other intervertebral disc degeneration, lumbar region without mention of lumbar back pain or lower extremity pain: Secondary | ICD-10-CM

## 2016-03-28 DIAGNOSIS — M19041 Primary osteoarthritis, right hand: Secondary | ICD-10-CM

## 2016-03-28 DIAGNOSIS — M19042 Primary osteoarthritis, left hand: Secondary | ICD-10-CM

## 2016-03-28 DIAGNOSIS — M5136 Other intervertebral disc degeneration, lumbar region: Secondary | ICD-10-CM

## 2016-03-28 DIAGNOSIS — M797 Fibromyalgia: Secondary | ICD-10-CM | POA: Insufficient documentation

## 2016-03-28 DIAGNOSIS — M351 Other overlap syndromes: Secondary | ICD-10-CM

## 2016-03-28 DIAGNOSIS — M2241 Chondromalacia patellae, right knee: Secondary | ICD-10-CM

## 2016-03-28 HISTORY — DX: Primary osteoarthritis, right hand: M19.041

## 2016-03-28 HISTORY — DX: Bilateral primary osteoarthritis of knee: M17.0

## 2016-03-28 HISTORY — DX: Chondromalacia patellae, right knee: M22.41

## 2016-03-28 HISTORY — DX: Other intervertebral disc degeneration, lumbar region without mention of lumbar back pain or lower extremity pain: M51.369

## 2016-03-28 HISTORY — DX: Other intervertebral disc degeneration, lumbar region: M51.36

## 2016-03-28 HISTORY — DX: Other overlap syndromes: M35.1

## 2016-03-28 HISTORY — DX: Chondromalacia patellae, left knee: M22.42

## 2016-03-28 NOTE — Progress Notes (Deleted)
Office Visit Note  Patient: Holly Davidson             Date of Birth: 07/06/1965           MRN: [id number]             PCP: Tanna FurryHOUT, BRITTANY, PA-C Referring: Tanna FurryHout, Brittany, PA-C Visit Date: 03/31/2016 Occupation: @GUAROCC @    Subjective:  No chief complaint on file.   History of Present Illness: Holly Davidson is a 50 y.o. female ***   Activities of Daily Living:  Patient reports morning stiffness for *** {minute/hour:19697}.   Patient {ACTIONS;DENIES/REPORTS:21021675::"Denies"} nocturnal pain.  Difficulty dressing/grooming: {ACTIONS;DENIES/REPORTS:21021675::"Denies"} Difficulty climbing stairs: {ACTIONS;DENIES/REPORTS:21021675::"Denies"} Difficulty getting out of chair: {ACTIONS;DENIES/REPORTS:21021675::"Denies"} Difficulty using hands for taps, buttons, cutlery, and/or writing: {ACTIONS;DENIES/REPORTS:21021675::"Denies"}   No Rheumatology ROS completed.   PMFS History:  Patient Active Problem List   Diagnosis Date Noted  . Osteoarthritis of both hands 03/28/2016  . 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 MCTD (mixed connective tissue disease) (HCC) 03/28/2016  . Osteoarthritis of both knees 03/28/2016  . DDD (degenerative disc disease), lumbar 03/28/2016  . Fibromyalgia 03/28/2016  . Morbid obesity, Initial weight - 282, BMI - 42.7 09/09/2012    Past Medical History:  Diagnosis Date  . Anemia   . Anxiety   . Arthritis   . Back pain    "OFF & ON"  . 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 DDD (degenerative disc disease), lumbar 03/28/2016  . Difficulty sleeping    occasional - takes med prn  . Fibromyalgia   . Herniated lumbar intervertebral disc   . Lupus    "presents as joint pain, occ. rash  . Lupus arthritis (HCC)   . MCTD (mixed connective tissue disease) (HCC) 03/28/2016   + ANA, + Ro, + RNP  . Osteoarthritis of both hands 03/28/2016  . Osteoarthritis of both knees 03/28/2016   Moderate     Family History  Problem Relation  Age of Onset  . COPD Other   . Hypertension Other   . Hyperlipidemia Other   . Stroke Other   . Diabetes Other    Past Surgical History:  Procedure Laterality Date  . ABDOMINAL HYSTERECTOMY    . BREATH TEK H PYLORI N/A 09/14/2013   Procedure: BREATH TEK H PYLORI;  Surgeon: Kandis Cockingavid H Newman, MD;  [facility];  Service: General;  Laterality: N/A;  . CESAREAN SECTION    . 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 right foot surgery    . TUBAL LIGATION     Social History   Social History Narrative  . No narrative on file     Objective: Vital Signs: There were no vitals taken for this visit.   Physical Exam   Musculoskeletal Exam: ***  CDAI Exam: No CDAI exam completed.    Investigation: No additional findings.   Imaging: No results found.  Speciality Comments: No specialty comments available.    Procedures:  No procedures performed Allergies: Patient has no known allergies.   Assessment / Plan: Visit Diagnoses: No diagnosis found.    Orders: No orders of the defined types were placed in this encounter.  No orders of the defined types were placed in this encounter.   Face-to-face time spent with patient was *** minutes. 50% of time was spent in counseling and coordination of  care.  Follow-Up Instructions: No Follow-up on file.   Tawni PummelNaitik Danaye Sobh, PA-C

## 2016-03-31 ENCOUNTER — Ambulatory Visit: Payer: Self-pay | Admitting: Rheumatology

## 2016-04-29 ENCOUNTER — Inpatient Hospital Stay (HOSPITAL_COMMUNITY): Admission: RE | Admit: 2016-04-29 | Payer: BLUE CROSS/BLUE SHIELD | Source: Ambulatory Visit

## 2016-04-29 ENCOUNTER — Ambulatory Visit (HOSPITAL_COMMUNITY)
Admission: RE | Admit: 2016-04-29 | Discharge: 2016-04-29 | Disposition: A | Discharge status: Home / Self Care | Payer: BLUE CROSS/BLUE SHIELD | Source: Ambulatory Visit | Attending: Cardiology | Admitting: Cardiology

## 2016-04-29 DIAGNOSIS — M359 Systemic involvement of connective tissue, unspecified: Secondary | ICD-10-CM

## 2016-10-15 ENCOUNTER — Encounter (HOSPITAL_COMMUNITY): Payer: Self-pay

## 2016-11-11 ENCOUNTER — Telehealth: Payer: Self-pay | Admitting: Rheumatology

## 2016-11-11 NOTE — Telephone Encounter (Signed)
Patient was unable to see Heart Dr. due to ongoing issues with her family. Patient needs new appt with heart doctor, and needs to know when to follow up with Dr. Corliss Skainseveshwar. Please call patient to advise.

## 2016-11-13 NOTE — Telephone Encounter (Signed)
Patient was seen in July 2017 for her Initial Rheum Visit and 01/2016 for her follow up visit where we referred her to Dr. Gala RomneyBensimhon. Patient was unable to keep that appointment due to family issue and has not followed up with us. Do you want her to follow up with us before we refer her to Dr. Gala RomneyBensimhon again?

## 2016-11-13 NOTE — Telephone Encounter (Signed)
She can schedule both appointments with me and with cardio

## 2016-11-13 NOTE — Telephone Encounter (Signed)
Patient provided with number to contact Dr. Prescott GumBensimhon's office and will call to try to schedule appointment. Will call our office if she has any issues. Patient has been scheduled with our office 12/23/16.

## 2016-11-17 ENCOUNTER — Telehealth (HOSPITAL_COMMUNITY): Payer: Self-pay | Admitting: Internal Medicine

## 2016-11-17 NOTE — Telephone Encounter (Signed)
Patient saw our number on her Caller ID and called back.  She is aware of a PFT, Echo and New Pt appts.  Pt also aware she will receive a New Pt Packet in the mail with her appt info and instructions.

## 2016-11-17 NOTE — Telephone Encounter (Signed)
Called patient to give her PFT, Echo and New Pt appts with our clinic.  Pt VM states it is full and unable to accept any messages.  Will try again to reach patient.

## 2016-12-17 ENCOUNTER — Encounter: Payer: Self-pay | Admitting: Registered"

## 2016-12-17 ENCOUNTER — Encounter: Payer: BLUE CROSS/BLUE SHIELD | Attending: Surgery | Admitting: Registered"

## 2016-12-17 DIAGNOSIS — Z9884 Bariatric surgery status: Secondary | ICD-10-CM | POA: Insufficient documentation

## 2016-12-17 DIAGNOSIS — Z713 Dietary counseling and surveillance: Secondary | ICD-10-CM | POA: Diagnosis not present

## 2016-12-17 DIAGNOSIS — E669 Obesity, unspecified: Secondary | ICD-10-CM

## 2016-12-17 NOTE — Progress Notes (Signed)
Follow-up visit: 3 years Post-Operative RYGB Surgery  Medical Nutrition Therapy:  Appt start time: 9:45 end time:  10:45.  Primary concerns today: Post-operative Bariatric Surgery Nutrition Management.  Non scale victories: none stated  Surgery date: 2015 Surgery type: RYGB [facility] Weight today: 244.4 lbs   TANITA  BODY COMP RESULTS  12/17/2016   BMI (kg/m^2) N/A   Fat Mass (lbs)    Fat Free Mass (lbs)    Total Body Water (lbs)     Pt states she had RYGB in 2015 and had one follow-up visit after having surgery. Pt states her last visit was in 2016 and she wants to get back on track with eating habits. Pt sates she is having a hard time eating protein. Pt reports her "biggest thing is issue with snacking on chips, popcorn, chocolate". Pt states she has knee pain makes it hard to exercising; history of lupus and it affects her joints. Pt states she has gym membership at [employer] but has not been yet. Pt states she has been trying to get in touch with [employer] so that her insurance can pay for protein shakes. Pt reports having protein powder that she enjoys at home; able to make shakes in the meantime. Pt states she had gotten down to 219 lbs then jumped to 250 lbs. Pt is not meeting protein or fluid needs.    Preferred Learning Style:   No preference indicated   Learning Readiness:   Contemplating  Ready  Change in progress  24-hr recall: B (AM): 1 egg (6g), 1 slice bacon (3g), whole wheat toast  Snk (AM):  Small handful of almonds (4g) L (PM): 1 oz cottage cheese (7g) with fruit Snk (PM): cheez-its  D (PM): 1 oz cottage cheese (7g) with fruit Snk (PM): none  Fluid intake: water w/ flavor packs (34 oz), coffee (16 oz), Twist sometimes; 50 oz Estimated total protein intake: approximately 23g  Medications: See list Supplementation: Centrum for Women, Multivitamin, Vit B12, Fe, Ca supp  Using straws: no Drinking while eating:  no Having you been chewing well: yes Chewing/swallowing difficulties: no Changes in vision: no Changes to mood/headaches: no Hair loss/Cahnges to skin/Changes to nails: no, no, no Any difficulty focusing or concentrating: no Sweating: no Dizziness/Lightheaded: sometimes Palpitations: no Carbonated beverages: no N/V/D/C/GAS: no, no, no, no, no Abdominal Pain: no Dumping syndrome: no Last Lap-Band fill: N/A  Recent physical activity:  Walking the dog 60 min/day, 6 days/week  Progress Towards Goal(s):  In progress.  Handouts given during visit include:  Vitamin and Mineral Recommendations  Snack Ideas for Bariatric Surgery  Bariatric Advantage Recover Program   Nutritional Diagnosis:  Inadequate fluid intake As related to bariatric surgery post-op recommendations.  As evidenced by dietary recall of less than 64 oz fluid a day. NI-5.7.1 Inadequate protein intake As related to bariatric surgery post-op recommendations.  As evidenced by dietary recall of less than 60g protein a day.    Intervention:  Nutrition education and counseling. RD educated and counseled pt on importance of meeting protein and fluid needs and ways to do so. Pt was also given information on assistance with purchasing bariatric-specific multivitamins.  Goals: - Increase protein intake to 60 grams per day by adding protein snack options.  - Increase fluid intake to 64 oz per day. - Add in protein shake or protein drink into your day to increase protein and fluid intake.  - Daily meal planning    Breakfast -  1 egg (6g), 1 slice of bacon (3g)    Snack - string cheese (6g)    Lunch - Protein shake (30g)    Snack - 1 oz cottage cheese (7g) with fruit    Dinner - 1 oz tuna (7g) with non-starchy vegetables (anything except peas, corn, white                  potatoes, and sweet potatoes)    Snack - Quest protein chips (19g) - Track food and drink intake daily.  - Check into Bariatric Advantage Recover  Program  Teaching Method Utilized:  Visual Auditory  Barriers to learning/adherence to lifestyle change: none  Demonstrated degree of understanding via:  Teach Back   Monitoring/Evaluation:  Dietary intake, exercise, and body weight. Follow up in 2 months for 3 year post-op visit.

## 2016-12-17 NOTE — Patient Instructions (Addendum)
-   Increase protein intake to 60 grams per day by adding protein snack options.   - Increase fluid intake to 64 oz per day.  - Add in protein shake or protein drink into your day to increase protein and fluid intake.   - Daily meal planning    Breakfast - 1 egg (6g), 1 slice of bacon (3g)    Snack - string cheese (6g)    Lunch - Protein shake (30g)    Snack - 1 oz cottage cheese (7g) with fruit    Dinner - 1 oz tuna (7g) with non-starchy vegetables (anything except peas, corn, white                  potatoes, and sweet potatoes)    Snack - Quest protein chips (19g)  - Track food and drink intake daily.   - Check into Bariatric Advantage Recover Program

## 2016-12-18 DIAGNOSIS — R5383 Other fatigue: Secondary | ICD-10-CM | POA: Insufficient documentation

## 2016-12-18 DIAGNOSIS — E559 Vitamin D deficiency, unspecified: Secondary | ICD-10-CM | POA: Insufficient documentation

## 2016-12-18 DIAGNOSIS — Z87891 Personal history of nicotine dependence: Secondary | ICD-10-CM | POA: Insufficient documentation

## 2016-12-18 DIAGNOSIS — F5101 Primary insomnia: Secondary | ICD-10-CM | POA: Insufficient documentation

## 2016-12-18 DIAGNOSIS — Z9884 Bariatric surgery status: Secondary | ICD-10-CM | POA: Insufficient documentation

## 2016-12-18 DIAGNOSIS — Z9049 Acquired absence of other specified parts of digestive tract: Secondary | ICD-10-CM | POA: Insufficient documentation

## 2016-12-18 NOTE — Progress Notes (Deleted)
Office Visit Note  Patient: Holly Davidson             Date of Birth: 01/18/1966           MRN: [id number]             PCP: Mikael SprayMcRae, Lauren, NP Referring: Mikael SprayMcRae, Lauren, NP Visit Date: 12/23/2016 Occupation: @GUAROCC @    Subjective:  No chief complaint on file.   History of Present Illness: Holly Davidson is a 51 y.o. female *** returns today after her last visit in September 2017. Patient was diagnosed with mixed connective tissue disease in 2010 by Dr.Zolkoska. She has history of positive ANA positive Ro positive RNP and arthritis. During her last visit use of Plaquenil was discussed. She has tried Plaquenil at the past by Dr. Hermine MessickZolkoska but it was discontinued according to patient due to some eye issues. She was advised to get eye examination. She was also referred to cardiology as she has mixed connective tissue disease. She was advised to get a baseline EKG due to positive Ro and its association with arrhythmias.  Activities of Daily Living:  Patient reports morning stiffness for *** {minute/hour:19697}.   Patient {ACTIONS;DENIES/REPORTS:21021675::"Denies"} nocturnal pain.  Difficulty dressing/grooming: {ACTIONS;DENIES/REPORTS:21021675::"Denies"} Difficulty climbing stairs: {ACTIONS;DENIES/REPORTS:21021675::"Denies"} Difficulty getting out of chair: {ACTIONS;DENIES/REPORTS:21021675::"Denies"} Difficulty using hands for taps, buttons, cutlery, and/or writing: {ACTIONS;DENIES/REPORTS:21021675::"Denies"}   No Rheumatology ROS completed.   PMFS History:  Patient Active Problem List   Diagnosis Date Noted  . Other fatigue 12/18/2016  . Primary insomnia 12/18/2016  . Vitamin D deficiency 12/18/2016  . Former smoker 12/18/2016  . History of cholecystectomy 12/18/2016  . History of gastric bypass 12/18/2016  . Osteoarthritis of both hands 03/28/2016  . 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 MCTD (mixed connective tissue disease) (HCC) + ANA, + Ro, + RNP 03/28/2016  .  Osteoarthritis of both knees 03/28/2016  . DDD (degenerative disc disease), lumbar 03/28/2016  . Fibromyalgia 03/28/2016  . Morbid obesity, Initial weight - 282, BMI - 42.7 09/09/2012    Past Medical History:  Diagnosis Date  . Anemia   . Anxiety   . Arthritis   . Back pain    "OFF & ON"  . 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 DDD (degenerative disc disease), lumbar 03/28/2016  . Difficulty sleeping    occasional - takes med prn  . Fibromyalgia   . Herniated lumbar intervertebral disc   . Hypertension   . Lupus    "presents as joint pain, occ. rash  . Lupus arthritis (HCC)   . MCTD (mixed connective tissue disease) (HCC) 03/28/2016   + ANA, + Ro, + RNP  . Osteoarthritis of both hands 03/28/2016  . Osteoarthritis of both knees 03/28/2016   Moderate     Family History  Problem Relation Age of Onset  . COPD Other   . Hypertension Other   . Hyperlipidemia Other   . Stroke Other   . Diabetes Other   . Cancer Other   . Heart disease Other    Past Surgical History:  Procedure Laterality Date  . ABDOMINAL HYSTERECTOMY    . BREATH TEK H PYLORI N/A 09/14/2013   Procedure: BREATH TEK H PYLORI;  Surgeon: Kandis Cockingavid H Newman, MD;  [facility];  Service: General;  Laterality: N/A;  . CESAREAN SECTION    . 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 right foot surgery    . TUBAL LIGATION     Social History   Social History Narrative  . No narrative on file     Objective: Vital Signs: There were no vitals taken for this visit.   Physical Exam   Musculoskeletal Exam: ***  CDAI Exam: No CDAI exam completed.    Investigation: Findings:   July 2017:  CBC and comprehensive metabolic panel was normal.  UA normal and CK was normal.  Rheumatoid factor was negative, 14-3-3 eta was high at 16.3.  ANA was  1:320 nucleolar specked pattern.  Anticardiolipin and Betadine 2 was negative.  Hepatitis panel, SPEP, G6PD and TB Gold were all within normal limits.  01/11/2016 After informed consent was obtained, per EULAR recommendations, ultrasound of bilateral hands was performed.  Using 12 megahertz transducer, gray scale and power Doppler ultrasound examination of bilateral 2nd, 3rd and 5th MCP joints and bilateral wrist joints was performed.  The findings were there was mild synovitis in right 3rd and 5th MCP joint and left 2nd and 3rd MCP joint.  She also had mild synovitis in her bilateral wrist joints on the radial aspect.  The right median nerve was 0.12 cm2, which was upper limits of normal, and the left was 0.13 cm2, which was more than upper limits of normal.  These findings were discussed with the patient.        Patient has appointment with Dr Jones BroomBensihmon on 01/22/2017   Imaging: No results found.  Speciality Comments: No specialty comments available.    Procedures:  No procedures performed Allergies: Patient has no known allergies.   Assessment / Plan:     Visit Diagnoses: MCTD (mixed connective tissue disease) (HCC) + ANA, + Ro, + RNP  High risk medication use  Fibromyalgia  Other fatigue  Primary insomnia  Primary osteoarthritis of both hands  Primary osteoarthritis of both knees  Chondromalacia of both patellae  DDD (degenerative disc disease), lumbar/ s/p lumbar suregery IDET   Morbid obesity, Initial weight - 282, BMI - 42.7  Vitamin D deficiency  Former smoker  History of cholecystectomy  History of gastric bypass    Orders: No orders of the defined types were placed in this encounter.  No orders of the defined types were placed in this encounter.   Face-to-face time spent with patient was *** minutes. 50% of time was spent in counseling and coordination of care.  Follow-Up Instructions: No Follow-up on file.   Pollyann SavoyShaili Nansi Birmingham, MD  Note - This  record has been created using [employer] software.  Chart creation errors have been sought, but may not always  have been located. Such creation errors do not reflect on  the standard of medical care.

## 2016-12-23 ENCOUNTER — Ambulatory Visit: Payer: Self-pay | Admitting: Rheumatology

## 2017-01-22 ENCOUNTER — Ambulatory Visit (HOSPITAL_BASED_OUTPATIENT_CLINIC_OR_DEPARTMENT_OTHER)
Admission: RE | Admit: 2017-01-22 | Discharge: 2017-01-22 | Disposition: A | Discharge status: Home / Self Care | Payer: BLUE CROSS/BLUE SHIELD | Source: Ambulatory Visit | Attending: Internal Medicine | Admitting: Internal Medicine

## 2017-01-22 ENCOUNTER — Ambulatory Visit (HOSPITAL_COMMUNITY)
Admission: RE | Admit: 2017-01-22 | Discharge: 2017-01-22 | Disposition: A | Discharge status: Home / Self Care | Payer: BLUE CROSS/BLUE SHIELD | Source: Ambulatory Visit | Attending: Internal Medicine | Admitting: Internal Medicine

## 2017-01-22 ENCOUNTER — Other Ambulatory Visit (HOSPITAL_COMMUNITY): Payer: BLUE CROSS/BLUE SHIELD

## 2017-01-22 VITALS — BP 148/92 | HR 68 | Wt 249.4 lb

## 2017-01-22 DIAGNOSIS — M351 Other overlap syndromes: Secondary | ICD-10-CM | POA: Insufficient documentation

## 2017-01-22 DIAGNOSIS — I700 Atherosclerosis of aorta: Secondary | ICD-10-CM | POA: Insufficient documentation

## 2017-01-22 DIAGNOSIS — R942 Abnormal results of pulmonary function studies: Secondary | ICD-10-CM | POA: Insufficient documentation

## 2017-01-22 DIAGNOSIS — R918 Other nonspecific abnormal finding of lung field: Secondary | ICD-10-CM | POA: Insufficient documentation

## 2017-01-22 DIAGNOSIS — R9431 Abnormal electrocardiogram [ECG] [EKG]: Secondary | ICD-10-CM | POA: Diagnosis not present

## 2017-01-22 DIAGNOSIS — M359 Systemic involvement of connective tissue, unspecified: Secondary | ICD-10-CM

## 2017-01-22 LAB — PULMONARY FUNCTION TEST
DL/VA % PRED: 78 %
DL/VA: 3.94 ml/min/mmHg/L
DLCO UNC: 15.75 ml/min/mmHg
DLCO unc % pred: 58 %
FEF 25-75 POST: 3.64 L/s
FEF 25-75 Pre: 3.55 L/sec
FEF2575-%Change-Post: 2 %
FEF2575-%PRED-POST: 144 %
FEF2575-%PRED-PRE: 140 %
FEV1-%Change-Post: 0 %
FEV1-%Pred-Post: 106 %
FEV1-%Pred-Pre: 105 %
FEV1-Post: 2.61 L
FEV1-Pre: 2.59 L
FEV1FVC-%CHANGE-POST: 2 %
FEV1FVC-%PRED-PRE: 107 %
FEV6-%CHANGE-POST: -1 %
FEV6-%PRED-PRE: 99 %
FEV6-%Pred-Post: 97 %
FEV6-POST: 2.93 L
FEV6-Pre: 2.98 L
FEV6FVC-%PRED-POST: 103 %
FEV6FVC-%Pred-Pre: 103 %
FVC-%Change-Post: -1 %
FVC-%PRED-POST: 95 %
FVC-%PRED-PRE: 96 %
FVC-PRE: 2.98 L
FVC-Post: 2.94 L
POST FEV6/FVC RATIO: 100 %
PRE FEV1/FVC RATIO: 87 %
Post FEV1/FVC ratio: 89 %
Pre FEV6/FVC Ratio: 100 %
RV % pred: 96 %
RV: 1.86 L
TLC % PRED: 90 %
TLC: 4.82 L

## 2017-01-22 MED ORDER — ALBUTEROL SULFATE (2.5 MG/3ML) 0.083% IN NEBU
2.5000 mg | INHALATION_SOLUTION | Freq: Once | RESPIRATORY_TRACT | Status: AC
  Administered 2017-01-22: 2.5 mg via RESPIRATORY_TRACT

## 2017-01-22 NOTE — Patient Instructions (Signed)
No changes to medications today.  No labs.  Chest xray today, will call you with results.  Follow up 12 months with Dr. Gala RomneyBensimhon. Will have PFTs and echo done again before your appointment.

## 2017-01-22 NOTE — Progress Notes (Signed)
[facility] CONSULT NOTE  Referring Physician: Dr. Berenice Bouton Primary Cardiologist: None  HPI:  Ms Connolly is a 51 y/o woman with morbid obesity, HTN, lupus and MCTD referred by Dr. Titus Dubin for screening for [facility] in setting of her CTD.   Denies any problems with her heart in past except for a murmur. Diagnosed with lupus in 2010. Has had extensive problems with arthritis pain but never diagnosed with PAH. 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   Quit smoking in 2000 ( < 1ppd x 30 years). Denies CP or SOB. Denies snoring. + LE edema. No syncope or presyncope. + occasional palpitation.   Echo today (reviewed personally) EF 65-70% RV normal. Normal diastolic parameters. Mild TR no significant PAH  PFTs 01/22/17 FEV1 2.6L (105%) FVC 2.94 (95%) DLCO 58%   Review of Systems: [y] = yes,  = no   General: Weight gain ; Weight loss ; Anorexia ; Fatigue ; Fever ; Chills ; Weakness   Cardiac: Chest pain/pressure ; Resting SOB ; Exertional SOB ; Orthopnea ; Pedal Edema ; Palpitations ; Syncope ; Presyncope ; Paroxysmal nocturnal dyspnea[ ]   Pulmonary: Cough ; Wheezing[ ] ; Hemoptysis[ ] ; Sputum ; Snoring   GI: Vomiting[ ] ; Dysphagia[ ] ; Melena[ ] ; Hematochezia ; Heartburn[ ] ; Abdominal pain ; Constipation ; Diarrhea ; BRBPR   GU: Hematuria[ ] ; Dysuria ; Nocturia[ ]   Vascular: Pain in legs with walking ; Pain in feet with lying flat ; Non-healing sores ; Stroke ; TIA ; Slurred speech ;  Neuro: Headaches[ ] ; Vertigo[ ] ; Seizures[ ] ; Paresthesias[ ] ;Blurred vision ; Diplopia ; Vision changes   Ortho/Skin: Arthritis ; Joint pain ; Muscle pain ; Joint swelling ; Back Pain ; Rash   Psych: Depression[ ] ; Anxiety[ ]   Heme: Bleeding problems ; Clotting disorders ; Anemia   Endocrine: Diabetes ; Thyroid dysfunction[ ]    Past Medical History:    Diagnosis Date  . Anemia   . Anxiety   . Arthritis   . Back pain    "OFF & ON"  . 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 DDD (degenerative disc disease), lumbar 03/28/2016  . Difficulty sleeping    occasional - takes med prn  . Fibromyalgia   . Herniated lumbar intervertebral disc   . Hypertension   . Lupus    "presents as joint pain, occ. rash  . Lupus arthritis (HCC)   . MCTD (mixed connective tissue disease) (HCC) 03/28/2016   + ANA, + Ro, + RNP  . Osteoarthritis of both hands 03/28/2016  . Osteoarthritis of both knees 03/28/2016   Moderate     Current Outpatient Prescriptions  Medication Sig Dispense Refill  . acetaminophen (TYLENOL) 500 MG tablet Take 1,000 mg by mouth every 6 (six) hours as needed for mild pain or moderate pain.    . Calcium Carbonate-Vit D-Min (CALTRATE 600+D PLUS PO) Take 1 tablet by mouth every morning.     . clonazePAM (KLONOPIN) 0.5 MG tablet Take 0.5 mg by mouth daily as needed for anxiety.     . cyclobenzaprine (FLEXERIL) 10 MG tablet Take 10 mg by mouth 2 (two) times daily as needed for muscle spasms.     . diclofenac (  VOLTAREN) 75 MG EC tablet Take 75 mg by mouth 2 (two) times daily.    . diphenhydrAMINE (BENADRYL) 25 mg capsule Take 25 mg by mouth at bedtime as needed for sleep.    . DULoxetine (CYMBALTA) 60 MG capsule Take 60 mg by mouth daily.    Marland Kitchen. escitalopram (LEXAPRO) 20 MG tablet Take 20 mg by mouth at bedtime.    . furosemide (LASIX) 20 MG tablet Take 20 mg by mouth daily as needed for fluid.     . hydrochlorothiazide (HYDRODIURIL) 25 MG tablet Take 25 mg by mouth daily.    Marland Kitchen. lidocaine (XYLOCAINE) 5 % ointment Apply 1 application topically as needed.    Marland Kitchen. lisinopril (PRINIVIL,ZESTRIL) 20 MG tablet Take 20 mg by mouth daily.    . Multiple Vitamin (MULTIVITAMIN WITH MINERALS) TABS tablet Take 1 tablet by mouth every morning.     . Oxycodone HCl 10 MG TABS Take 10 mg by mouth 4 (four) times daily as needed (for pain).    .  polyvinyl alcohol (LIQUIFILM TEARS) 1.4 % ophthalmic solution Place 2 drops into both eyes daily as needed for dry eyes.    . pregabalin (LYRICA) 100 MG capsule Take 100 mg by mouth 2 (two) times daily.    . traZODone (DESYREL) 100 MG tablet Take 100 mg by mouth at bedtime.     No current facility-administered medications for this encounter.     No Known Allergies    Social History   Social History  . Marital status: Married    Spouse name: N/A  . Number of children: N/A  . Years of education: N/A   Occupational History  . Not on file.   Social History Main Topics  . Smoking status: Former Smoker    Quit date: 09/10/1998  . Smokeless tobacco: Not on file  . Alcohol use No  . Drug use: No  . Sexual activity: Not on file   Other Topics Concern  . Not on file   Social History Narrative  . No narrative on file      Family History  Problem Relation Age of Onset  . COPD Other   . Hypertension Other   . Hyperlipidemia Other   . Stroke Other   . Diabetes Other   . Cancer Other   . Heart disease Other     Vitals:   01/22/17 1108  BP: (!) 148/92  Pulse: 68  SpO2: 96%  Weight: 249 lb 6.4 oz (113.1 kg)    PHYSICAL EXAM: General:  Well appearing. No respiratory difficulty HEENT: normal Neck: supple. no JVD. Carotids 2+ bilat; no bruits. No lymphadenopathy or thryomegaly appreciated. Cor: PMI nondisplaced. Regular rate & rhythm. No rubs, gallops or murmurs. Lungs: clear Abdomen: Obese soft, nontender, nondistended. No hepatosplenomegaly. No bruits or masses. Good bowel sounds. Extremities: no cyanosis, clubbing, rash, edema Neuro: alert & oriented x 3, cranial nerves grossly intact. moves all 4 extremities w/o difficulty. Affect pleasant.  ECG: NSR 61. Anterior q waves No ST-T wave abnormalities.    ASSESSMENT & PLAN:  1. Lupus/mixed connective tissue disease - Dicussed incidence of PAD in patients with CTD in detail - echo and PFTS reviewed. DLCO mildly  decreased but likely due to previous tobacco use. No evidence of PAH. 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 No evidence of IPF (viewed personally)  2. Abnormal ECG - anterior q-waves suggestive of possible previous anterior MI but echo completely normal. Suspect possibly due to  poor precordial lead placement.   Arvilla Meres, MD  10:55 PM

## 2017-01-22 NOTE — Progress Notes (Signed)
  Echocardiogram 2D Echocardiogram has been performed.  Arvil ChacoFoster, Delayni Streed 01/22/2017, 11:11 AM

## 2017-02-17 ENCOUNTER — Ambulatory Visit: Payer: BLUE CROSS/BLUE SHIELD | Admitting: Registered"

## 2017-04-10 NOTE — Progress Notes (Signed)
Office Visit Note  Patient: Holly Davidson             Date of Birth: 1966-01-31           MRN: [id number]             PCP: Mikael Spray, NP Referring: Mikael Spray, NP Visit Date: 04/23/2017 Occupation: @GUAROCC @    Subjective:  Bilateral shoulder pain   History of Present Illness: Holly Davidson is a 51 y.o. female with history of mixed connective tissue disease, osteoarthritis, fibromyalgia, and disc disease of lumbar spine.  Patient states she is having severe bilateral shoulder joint pain and limited ROM.  Patient states she has had cortisone injections in the past but does not want one today.  She states she is having muscle tenderness over her trapezius muscle and generalized pain.  She continues to have insomnia, which she takes Trazodone for at night.  She is also having fatigue, which has been worsening her depressive symptoms (she is on Lexapro).  She states her and her husband have been looking into joining [facility] [employer] to do water aerobics.  She also sees the chiropractor 3 times a week, which has provided significant relief for her.  She states her hands are painful and swelling as well.  Denies any other joint swelling.  She continues to have bilateral knee pain and stiffness as well.      Activities of Daily Living:  Patient reports morning stiffness for 1-1.5 hours.   Patient Reports nocturnal pain.  Difficulty dressing/grooming: Reports Difficulty climbing stairs: Reports Difficulty getting out of chair: Reports Difficulty using hands for taps, buttons, cutlery, and/or writing: Denies   Review of Systems  Constitutional: Positive for fatigue. Negative for weakness.  HENT: Positive for mouth sores. Negative for mouth dryness and nose dryness.   Eyes: Positive for redness and dryness (uses OTC drops).  Respiratory: Positive for cough. Negative for hemoptysis, shortness of breath and difficulty breathing.   Cardiovascular: Positive for hypertension. Negative for  chest pain, palpitations, irregular heartbeat and swelling in legs/feet.  Gastrointestinal: Negative for blood in stool, constipation and diarrhea.  Endocrine: Negative for increased urination.  Genitourinary: Negative for painful urination.  Musculoskeletal: Positive for arthralgias, joint pain, joint swelling, morning stiffness and muscle tenderness. Negative for myalgias, muscle weakness and myalgias.  Skin: Positive for rash. Negative for color change, pallor, hair loss, nodules/bumps, redness, skin tightness, ulcers and sensitivity to sunlight.  Neurological: Negative for dizziness, numbness and headaches.  Hematological: Negative for swollen glands.  Psychiatric/Behavioral: Positive for depressed mood and sleep disturbance. The patient is nervous/anxious.     PMFS History:  Patient Active Problem List   Diagnosis Date Noted  . Other fatigue 12/18/2016  . Primary insomnia 12/18/2016  . Vitamin D deficiency 12/18/2016  . Former smoker 12/18/2016  . History of cholecystectomy 12/18/2016  . History of gastric bypass 12/18/2016  . Osteoarthritis of both hands 03/28/2016  . 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 MCTD (mixed connective tissue disease) (HCC) + ANA, + Ro, + RNP 03/28/2016  . Osteoarthritis of both knees 03/28/2016  . DDD (degenerative disc disease), lumbar 03/28/2016  . Fibromyalgia 03/28/2016  . Morbid obesity, Initial weight - 282, BMI - 42.7 09/09/2012    Past Medical History:  Diagnosis Date  . Anemia   . Anxiety   . Arthritis   . Back pain    "OFF & ON"  . 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  DDD (degenerative disc disease), lumbar 03/28/2016  . Difficulty sleeping    occasional - takes med prn  . Fibromyalgia   . Herniated lumbar intervertebral disc   . Hypertension   . Lupus    "presents as joint pain, occ. rash  . Lupus arthritis (HCC)   . MCTD (mixed connective tissue disease) (HCC) 03/28/2016   + ANA, + Ro, + RNP  .  Osteoarthritis of both hands 03/28/2016  . Osteoarthritis of both knees 03/28/2016   Moderate     Family History  Problem Relation Age of Onset  . Pulmonary embolism Mother   . Cancer Father        colon  . COPD Other   . Hypertension Other   . Hyperlipidemia Other   . Stroke Other   . Diabetes Other   . Cancer Other   . Heart disease Other   . Rheum arthritis Sister   . Autoimmune disease Daughter   . Sailor Hevia' disease Son    Past Surgical History:  Procedure Laterality Date  . ABDOMINAL HYSTERECTOMY    . BREATH TEK H PYLORI N/A 09/14/2013   Procedure: BREATH TEK H PYLORI;  Surgeon: Kandis Cockingavid H Newman, MD;  [facility];  Service: General;  Laterality: N/A;  . CESAREAN SECTION    . 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 right foot surgery    . TUBAL LIGATION     Social History   Social History Narrative  . Not on file     Objective: Vital Signs: BP 109/82 (BP Location: Left Arm, Patient Position: Sitting, Cuff Size: Normal)   Pulse 86   Resp 17   Ht 5\' 6"  (1.676 m)   Wt 256 lb (116.1 kg)   BMI 41.32 kg/m    Physical Exam  Constitutional: She is oriented to person, place, and time. She appears well-developed and well-nourished.  HENT:  Head: Normocephalic and atraumatic.  Eyes: Conjunctivae and EOM are normal.  Neck: Normal range of motion.  Cardiovascular: Normal rate, regular rhythm, normal heart sounds and intact distal pulses.  Pulmonary/Chest: Effort normal and breath sounds normal.  Abdominal: Soft. Bowel sounds are normal.  Lymphadenopathy:    She has no cervical adenopathy.  Neurological: She is alert and oriented to person, place, and time.  Skin: Skin is warm and dry. Capillary refill takes less than 2 seconds.  Psychiatric: She has a normal mood and affect. Her behavior is normal.  Nursing  note and vitals reviewed.    Musculoskeletal Exam: Generalized hyperalgesia. C-spine, thoracic, and lumbar spine good ROM with discomfort.   Shoulder joints limited ROM with discomfort.  Elbows and wrists good ROM.  MCPs, PIPs, and DIPs good ROM with no synovitis.  Mild synovial thickening of DIPs.  Hip ROM good .  Right knee pain with flexion and extension.  Ankle joints good ROM with no synovitis.    No greater trochanteric bursitis.  No SI joint pain.  No midline spinal tenderness   CDAI Exam: No CDAI exam completed.    Investigation: No additional findings.   Imaging: No results found.  Speciality Comments: No specialty comments available.    Procedures:  No procedures performed Allergies: Patient has no known allergies.   Assessment / Plan:     Visit Diagnoses: MCTD (mixed connective tissue disease) (HCC) + ANA, + Ro, + RNP - +  ANA, +Ro, +RNP, photo, sicca - Patient is asymptomatic right now. Patient was seen by Dr. Gala RomneyBensimhon.  According to his note, there was no evidence of PAH.  CXR showed mild COPD, no evidence of IPF.  Echo was normal.  Routine labs were drawn today listed as follows- Plan: CBC with Differential/Platelet, COMPLETE METABOLIC PANEL WITH GFR, Anti-DNA antibody, double-stranded, C3 and C4, Sedimentation rate, ANA, Sjogrens syndrome-A extractable nuclear antibody, Sjogrens syndrome-B extractable nuclear antibody, Anti-Smith antibody, RNP Antibody, Anti-scleroderma antibody  Chronic pain in bilateral shoulders: Limited ROM with discomfort. Tenderness of surrounding muscles.  She declined a cortisone injection.  Provided the patient with a handout for shoulder exercises.   Primary osteoarthritis of both hands: No synovitis on exam.  Continues to have intermittent pain and swelling according to patient.    Primary osteoarthritis of both knees: No warmth or effusion on exam.  Patient continues to have pain after standing or sitting for long periods of time.  Declined  cortisone injection.  Chondromalacia of both patellae: Bilateral knee pain especially with flexion.   Fibromyalgia: Generalized hyperalgesia on exam.  Discussed trying water aerobics and/or massage therapy.  She is going to look into joining the [employer].  She is currently taking Flexeril, Voltaren 75 mg tablet, Cymbalta, Lyrica, Lexapro, and Trazodone.      DDD (degenerative disc disease), lumbar: continues to have chronic pain and discomfort.   Other fatigue: patient reports significant fatigue.  Discussed sleep hygiene and introducing a new exercise regimen.    Primary insomnia: Continues to have difficulties sleeping. Takes Trazodone 100 mg by mouth at bedtime.  Discussed good sleep hygiene.    Other medical conditions are listed as follows:   History of cholecystectomy  History of gastric bypass  Former smoker  Vitamin D deficiency  History of anxiety: Patient is on Lexapro 20 mg tablet.    History of depression: Patient is on Lexapro 20 mg tablet.    History of hypertension: Well controlled in the office today.  She is taking Lisinopril and HCTZ.     History of anemia    Orders: Orders Placed This Encounter  Procedures  . CBC with Differential/Platelet  . COMPLETE METABOLIC PANEL WITH GFR  . Anti-DNA antibody, double-stranded  . C3 and C4  . Sedimentation rate  . ANA  . Sjogrens syndrome-A extractable nuclear antibody  . Sjogrens syndrome-B extractable nuclear antibody  . Anti-Smith antibody  . RNP Antibody  . Anti-scleroderma antibody   No orders of the defined types were placed in this encounter.     Follow-Up Instructions: Return in about 6 months (around 10/22/2017) for Osteoarthritis, Fibromyalgia.   Note - This record has been created using [employer] software.  Chart creation errors have been sought, but may not always  have been located. Such creation errors do not reflect on  the standard of medical care.

## 2017-04-23 ENCOUNTER — Ambulatory Visit (INDEPENDENT_AMBULATORY_CARE_PROVIDER_SITE_OTHER): Payer: BLUE CROSS/BLUE SHIELD | Admitting: Rheumatology

## 2017-04-23 ENCOUNTER — Encounter: Payer: Self-pay | Admitting: Rheumatology

## 2017-04-23 VITALS — BP 109/82 | HR 86 | Resp 17 | Ht 66.0 in | Wt 256.0 lb

## 2017-04-23 DIAGNOSIS — M797 Fibromyalgia: Secondary | ICD-10-CM | POA: Diagnosis not present

## 2017-04-23 DIAGNOSIS — M170 Bilateral primary osteoarthritis of knee: Secondary | ICD-10-CM

## 2017-04-23 DIAGNOSIS — Z87891 Personal history of nicotine dependence: Secondary | ICD-10-CM

## 2017-04-23 DIAGNOSIS — M19041 Primary osteoarthritis, right hand: Secondary | ICD-10-CM

## 2017-04-23 DIAGNOSIS — M19042 Primary osteoarthritis, left hand: Secondary | ICD-10-CM

## 2017-04-23 DIAGNOSIS — M2241 Chondromalacia patellae, right knee: Secondary | ICD-10-CM

## 2017-04-23 DIAGNOSIS — M5136 Other intervertebral disc degeneration, lumbar region: Secondary | ICD-10-CM | POA: Diagnosis not present

## 2017-04-23 DIAGNOSIS — Z9049 Acquired absence of other specified parts of digestive tract: Secondary | ICD-10-CM

## 2017-04-23 DIAGNOSIS — F5101 Primary insomnia: Secondary | ICD-10-CM | POA: Diagnosis not present

## 2017-04-23 DIAGNOSIS — M351 Other overlap syndromes: Secondary | ICD-10-CM | POA: Diagnosis not present

## 2017-04-23 DIAGNOSIS — E559 Vitamin D deficiency, unspecified: Secondary | ICD-10-CM | POA: Diagnosis not present

## 2017-04-23 DIAGNOSIS — Z8659 Personal history of other mental and behavioral disorders: Secondary | ICD-10-CM

## 2017-04-23 DIAGNOSIS — Z9884 Bariatric surgery status: Secondary | ICD-10-CM | POA: Diagnosis not present

## 2017-04-23 DIAGNOSIS — Z862 Personal history of diseases of the blood and blood-forming organs and certain disorders involving the immune mechanism: Secondary | ICD-10-CM

## 2017-04-23 DIAGNOSIS — Z8679 Personal history of other diseases of the circulatory system: Secondary | ICD-10-CM

## 2017-04-23 DIAGNOSIS — R5383 Other fatigue: Secondary | ICD-10-CM

## 2017-04-23 DIAGNOSIS — M2242 Chondromalacia patellae, left knee: Secondary | ICD-10-CM

## 2017-04-23 NOTE — Patient Instructions (Signed)
Shoulder Exercises Ask your health care provider which exercises are safe for you. Do exercises exactly as told by your health care provider and adjust them as directed. It is normal to feel mild stretching, pulling, tightness, or discomfort as you do these exercises, but you should stop right away if you feel sudden pain or your pain gets worse.Do not begin these exercises until told by your health care provider. RANGE OF MOTION EXERCISES These exercises warm up your muscles and joints and improve the movement and flexibility of your shoulder. These exercises also help to relieve pain, numbness, and tingling. These exercises involve stretching your injured shoulder directly. Exercise A: Pendulum  1. Stand near a wall or a surface that you can hold onto for balance. 2. Bend at the waist and let your left / right arm hang straight down. Use your other arm to support you. Keep your back straight and do not lock your knees. 3. Relax your left / right arm and shoulder muscles, and move your hips and your trunk so your left / right arm swings freely. Your arm should swing because of the motion of your body, not because you are using your arm or shoulder muscles. 4. Keep moving your body so your arm swings in the following directions, as told by your health care provider: ? Side to side. ? Forward and backward. ? In clockwise and counterclockwise circles. 5. Continue each motion for __________ seconds, or for as long as told by your health care provider. 6. Slowly return to the starting position. Repeat __________ times. Complete this exercise __________ times a day. Exercise B:Flexion, Standing  1. Stand and hold a broomstick, a cane, or a similar object. Place your hands a little more than shoulder-width apart on the object. Your left / right hand should be palm-up, and your other hand should be palm-down. 2. Keep your elbow straight and keep your shoulder muscles relaxed. Push the stick down with  your healthy arm to raise your left / right arm in front of your body, and then over your head until you feel a stretch in your shoulder. ? Avoid shrugging your shoulder while you raise your arm. Keep your shoulder blade tucked down toward the middle of your back. 3. Hold for __________ seconds. 4. Slowly return to the starting position. Repeat __________ times. Complete this exercise __________ times a day. Exercise C: Abduction, Standing 1. Stand and hold a broomstick, a cane, or a similar object. Place your hands a little more than shoulder-width apart on the object. Your left / right hand should be palm-up, and your other hand should be palm-down. 2. While keeping your elbow straight and your shoulder muscles relaxed, push the stick across your body toward your left / right side. Raise your left / right arm to the side of your body and then over your head until you feel a stretch in your shoulder. ? Do not raise your arm above shoulder height, unless your health care provider tells you to do that. ? Avoid shrugging your shoulder while you raise your arm. Keep your shoulder blade tucked down toward the middle of your back. 3. Hold for __________ seconds. 4. Slowly return to the starting position. Repeat __________ times. Complete this exercise __________ times a day. Exercise D:Internal Rotation  1. Place your left / right hand behind your back, palm-up. 2. Use your other hand to dangle an exercise band, a towel, or a similar object over your shoulder. Grasp the band with   your left / right hand so you are holding onto both ends. 3. Gently pull up on the band until you feel a stretch in the front of your left / right shoulder. ? Avoid shrugging your shoulder while you raise your arm. Keep your shoulder blade tucked down toward the middle of your back. 4. Hold for __________ seconds. 5. Release the stretch by letting go of the band and lowering your hands. Repeat __________ times. Complete  this exercise __________ times a day. STRETCHING EXERCISES These exercises warm up your muscles and joints and improve the movement and flexibility of your shoulder. These exercises also help to relieve pain, numbness, and tingling. These exercises are done using your healthy shoulder to help stretch the muscles of your injured shoulder. Exercise E: Corner Stretch (External Rotation and Abduction)  1. Stand in a doorway with one of your feet slightly in front of the other. This is called a staggered stance. If you cannot reach your forearms to the door frame, stand facing a corner of a room. 2. Choose one of the following positions as told by your health care provider: ? Place your hands and forearms on the door frame above your head. ? Place your hands and forearms on the door frame at the height of your head. ? Place your hands on the door frame at the height of your elbows. 3. Slowly move your weight onto your front foot until you feel a stretch across your chest and in the front of your shoulders. Keep your head and chest upright and keep your abdominal muscles tight. 4. Hold for __________ seconds. 5. To release the stretch, shift your weight to your back foot. Repeat __________ times. Complete this stretch __________ times a day. Exercise F:Extension, Standing 1. Stand and hold a broomstick, a cane, or a similar object behind your back. ? Your hands should be a little wider than shoulder-width apart. ? Your palms should face away from your back. 2. Keeping your elbows straight and keeping your shoulder muscles relaxed, move the stick away from your body until you feel a stretch in your shoulder. ? Avoid shrugging your shoulders while you move the stick. Keep your shoulder blade tucked down toward the middle of your back. 3. Hold for __________ seconds. 4. Slowly return to the starting position. Repeat __________ times. Complete this exercise __________ times a day. STRENGTHENING  EXERCISES These exercises build strength and endurance in your shoulder. Endurance is the ability to use your muscles for a long time, even after they get tired. Exercise G:External Rotation  1. Sit in a stable chair without armrests. 2. Secure an exercise band at elbow height on your left / right side. 3. Place a soft object, such as a folded towel or a small pillow, between your left / right upper arm and your body to move your elbow a few inches away (about 10 cm) from your side. 4. Hold the end of the band so it is tight and there is no slack. 5. Keeping your elbow pressed against the soft object, move your left / right forearm out, away from your abdomen. Keep your body steady so only your forearm moves. 6. Hold for __________ seconds. 7. Slowly return to the starting position. Repeat __________ times. Complete this exercise __________ times a day. Exercise H:Shoulder Abduction  1. Sit in a stable chair without armrests, or stand. 2. Hold a __________ weight in your left / right hand, or hold an exercise band with both hands.   3. Start with your arms straight down and your left / right palm facing in, toward your body. 4. Slowly lift your left / right hand out to your side. Do not lift your hand above shoulder height unless your health care provider tells you that this is safe. ? Keep your arms straight. ? Avoid shrugging your shoulder while you do this movement. Keep your shoulder blade tucked down toward the middle of your back. 5. Hold for __________ seconds. 6. Slowly lower your arm, and return to the starting position. Repeat __________ times. Complete this exercise __________ times a day. Exercise I:Shoulder Extension 1. Sit in a stable chair without armrests, or stand. 2. Secure an exercise band to a stable object in front of you where it is at shoulder height. 3. Hold one end of the exercise band in each hand. Your palms should face each other. 4. Straighten your elbows and  lift your hands up to shoulder height. 5. Step back, away from the secured end of the exercise band, until the band is tight and there is no slack. 6. Squeeze your shoulder blades together as you pull your hands down to the sides of your thighs. Stop when your hands are straight down by your sides. Do not let your hands go behind your body. 7. Hold for __________ seconds. 8. Slowly return to the starting position. Repeat __________ times. Complete this exercise __________ times a day. Exercise J:Standing Shoulder Row 1. Sit in a stable chair without armrests, or stand. 2. Secure an exercise band to a stable object in front of you so it is at waist height. 3. Hold one end of the exercise band in each hand. Your palms should be in a thumbs-up position. 4. Bend each of your elbows to an "L" shape (about 90 degrees) and keep your upper arms at your sides. 5. Step back until the band is tight and there is no slack. 6. Slowly pull your elbows back behind you. 7. Hold for __________ seconds. 8. Slowly return to the starting position. Repeat __________ times. Complete this exercise __________ times a day. Exercise K:Shoulder Press-Ups  1. Sit in a stable chair that has armrests. Sit upright, with your feet flat on the floor. 2. Put your hands on the armrests so your elbows are bent and your fingers are pointing forward. Your hands should be about even with the sides of your body. 3. Push down on the armrests and use your arms to lift yourself off of the chair. Straighten your elbows and lift yourself up as much as you comfortably can. ? Move your shoulder blades down, and avoid letting your shoulders move up toward your ears. ? Keep your feet on the ground. As you get stronger, your feet should support less of your body weight as you lift yourself up. 4. Hold for __________ seconds. 5. Slowly lower yourself back into the chair. Repeat __________ times. Complete this exercise __________ times a  day. Exercise L: Wall Push-Ups  1. Stand so you are facing a stable wall. Your feet should be about one arm-length away from the wall. 2. Lean forward and place your palms on the wall at shoulder height. 3. Keep your feet flat on the floor as you bend your elbows and lean forward toward the wall. 4. Hold for __________ seconds. 5. Straighten your elbows to push yourself back to the starting position. Repeat __________ times. Complete this exercise __________ times a day. This information is not intended to replace advice   given to you by your health care provider. Make sure you discuss any questions you have with your health care provider. Document Released: 03/19/2005 Document Revised: 01/28/2016 Document Reviewed: 01/14/2015 [employer] Interactive Patient Education  2018 [employer]  

## 2017-04-24 LAB — COMPLETE METABOLIC PANEL WITH GFR
AG Ratio: 1 (calc) (ref 1.0–2.5)
ALBUMIN MSPROF: 3.8 g/dL (ref 3.6–5.1)
ALKALINE PHOSPHATASE (APISO): 80 U/L (ref 33–130)
ALT: 14 U/L (ref 6–29)
AST: 17 U/L (ref 10–35)
BUN: 10 mg/dL (ref 7–25)
CO2: 29 mmol/L (ref 20–32)
CREATININE: 0.75 mg/dL (ref 0.50–1.05)
Calcium: 9.4 mg/dL (ref 8.6–10.4)
Chloride: 100 mmol/L (ref 98–110)
GFR, Est African American: 106 mL/min/{1.73_m2} (ref >=60)
GFR, Est Non African American: 92 mL/min/{1.73_m2} (ref >=60)
GLUCOSE: 87 mg/dL (ref 65–99)
Globulin: 3.7 g/dL (calc) (ref 1.9–3.7)
Potassium: 4.2 mmol/L (ref 3.5–5.3)
Sodium: 137 mmol/L (ref 135–146)
Total Bilirubin: 0.4 mg/dL (ref 0.2–1.2)
Total Protein: 7.5 g/dL (ref 6.1–8.1)

## 2017-04-24 LAB — CBC WITH DIFFERENTIAL/PLATELET
BASOS PCT: 0.6 %
Basophils Absolute: 38 cells/uL (ref 0–200)
EOS PCT: 5.7 %
Eosinophils Absolute: 365 cells/uL (ref 15–500)
HCT: 35.7 % (ref 35.0–45.0)
HEMOGLOBIN: 11.5 g/dL — AB (ref 11.7–15.5)
Lymphs Abs: 2189 cells/uL (ref 850–3900)
MCH: 24.8 pg — ABNORMAL LOW (ref 27.0–33.0)
MCHC: 32.2 g/dL (ref 32.0–36.0)
MCV: 76.9 fL — ABNORMAL LOW (ref 80.0–100.0)
MONOS PCT: 9.3 %
MPV: 9.5 fL (ref 7.5–12.5)
NEUTROS ABS: 3213 {cells}/uL (ref 1500–7800)
Neutrophils Relative %: 50.2 %
Platelets: 377 10*3/uL (ref 140–400)
RBC: 4.64 10*6/uL (ref 3.80–5.10)
RDW: 15.5 % — ABNORMAL HIGH (ref 11.0–15.0)
Total Lymphocyte: 34.2 %
WBC mixed population: 595 cells/uL (ref 200–950)
WBC: 6.4 10*3/uL (ref 3.8–10.8)

## 2017-04-24 LAB — SJOGRENS SYNDROME-A EXTRACTABLE NUCLEAR ANTIBODY: SSA (Ro) (ENA) Antibody, IgG: >8 AI — AB

## 2017-04-24 LAB — SJOGRENS SYNDROME-B EXTRACTABLE NUCLEAR ANTIBODY: SSB (La) (ENA) Antibody, IgG: <1 AI

## 2017-04-24 LAB — ANTI-SCLERODERMA ANTIBODY: SCLERODERMA (SCL-70) (ENA) ANTIBODY, IGG: NEGATIVE AI

## 2017-04-24 LAB — ANTI-SMITH ANTIBODY: ENA SM AB SER-ACNC: NEGATIVE AI

## 2017-04-24 LAB — ANTI-NUCLEAR AB-TITER (ANA TITER)

## 2017-04-24 LAB — RNP ANTIBODY: RIBONUCLEIC PROTEIN(ENA) ANTIBODY, IGG: POSITIVE AI — AB

## 2017-04-24 LAB — C3 AND C4
C3 Complement: 133 mg/dL (ref 83–193)
C4 Complement: 35 mg/dL (ref 15–57)

## 2017-04-24 LAB — SEDIMENTATION RATE: SED RATE: 33 mm/h — AB (ref 0–30)

## 2017-04-24 LAB — ANA: Anti Nuclear Antibody(ANA): POSITIVE — AB

## 2017-04-24 LAB — ANTI-DNA ANTIBODY, DOUBLE-STRANDED: ds DNA Ab: <1 IU/mL

## 2017-04-24 NOTE — Progress Notes (Signed)
Labs are stable.  Compared values found in [facility] (Ro positive, RNP positive, ANA positive with 1:320 titer)  Anemia is stable.  Sed rate slightly elevated.  All other labs WNL

## 2017-06-30 DIAGNOSIS — E669 Obesity, unspecified: Secondary | ICD-10-CM | POA: Diagnosis not present

## 2017-06-30 DIAGNOSIS — M329 Systemic lupus erythematosus, unspecified: Secondary | ICD-10-CM | POA: Diagnosis not present

## 2017-06-30 DIAGNOSIS — I10 Essential (primary) hypertension: Secondary | ICD-10-CM | POA: Diagnosis not present

## 2017-06-30 DIAGNOSIS — R079 Chest pain, unspecified: Secondary | ICD-10-CM | POA: Diagnosis not present

## 2017-06-30 DIAGNOSIS — Z8249 Family history of ischemic heart disease and other diseases of the circulatory system: Secondary | ICD-10-CM | POA: Diagnosis not present

## 2017-07-01 DIAGNOSIS — E669 Obesity, unspecified: Secondary | ICD-10-CM | POA: Diagnosis not present

## 2017-07-01 DIAGNOSIS — M329 Systemic lupus erythematosus, unspecified: Secondary | ICD-10-CM | POA: Diagnosis not present

## 2017-07-01 DIAGNOSIS — I10 Essential (primary) hypertension: Secondary | ICD-10-CM | POA: Diagnosis not present

## 2017-07-01 DIAGNOSIS — Z8249 Family history of ischemic heart disease and other diseases of the circulatory system: Secondary | ICD-10-CM | POA: Diagnosis not present

## 2017-07-01 DIAGNOSIS — R079 Chest pain, unspecified: Secondary | ICD-10-CM

## 2017-07-17 DIAGNOSIS — Z8739 Personal history of other diseases of the musculoskeletal system and connective tissue: Secondary | ICD-10-CM | POA: Insufficient documentation

## 2017-07-17 DIAGNOSIS — I10 Essential (primary) hypertension: Secondary | ICD-10-CM | POA: Insufficient documentation

## 2017-07-17 DIAGNOSIS — R079 Chest pain, unspecified: Secondary | ICD-10-CM | POA: Insufficient documentation

## 2017-07-28 DIAGNOSIS — L930 Discoid lupus erythematosus: Secondary | ICD-10-CM | POA: Insufficient documentation

## 2017-07-28 DIAGNOSIS — R9431 Abnormal electrocardiogram [ECG] [EKG]: Secondary | ICD-10-CM | POA: Insufficient documentation

## 2017-10-14 NOTE — Progress Notes (Deleted)
Office Visit Note  Patient: Holly Davidson             Date of Birth: [birth year]           MRN: [id number]             PCP: Mikael Spray, NP Referring: Mikael Spray, NP Visit Date: 10/22/2017 Occupation: @    Subjective:  No chief complaint on file.   History of Present Illness: Holly Davidson is a 52 y.o. female ***   Activities of Daily Living:  Patient reports morning stiffness for *** {minute/hour:19697}.   Patient {ACTIONS;DENIES/REPORTS:21021675::"Denies"} nocturnal pain.  Difficulty dressing/grooming: {ACTIONS;DENIES/REPORTS:21021675::"Denies"} Difficulty climbing stairs: {ACTIONS;DENIES/REPORTS:21021675::"Denies"} Difficulty getting out of chair: {ACTIONS;DENIES/REPORTS:21021675::"Denies"} Difficulty using hands for taps, buttons, cutlery, and/or writing: {ACTIONS;DENIES/REPORTS:21021675::"Denies"}   No Rheumatology ROS completed.   PMFS History:  Patient Active Problem List   Diagnosis Date Noted  . Other fatigue 12/18/2016  . Primary insomnia 12/18/2016  . Vitamin D deficiency 12/18/2016  . Former smoker 12/18/2016  . History of cholecystectomy 12/18/2016  . History of gastric bypass 12/18/2016  . Osteoarthritis of both hands 03/28/2016  . 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 MCTD (mixed connective tissue disease) (HCC) + ANA, + Ro, + RNP 03/28/2016  . Osteoarthritis of both knees 03/28/2016  . DDD (degenerative disc disease), lumbar 03/28/2016  . Fibromyalgia 03/28/2016  . Morbid obesity, Initial weight - 282, BMI - 42.7 09/09/2012    Past Medical History:  Diagnosis Date  . Anemia   . Anxiety   . Arthritis   . Back pain    "OFF & ON"  . 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 DDD (degenerative disc disease), lumbar 03/28/2016  . Difficulty sleeping    occasional - takes med prn  . Fibromyalgia   . Herniated lumbar intervertebral disc   . Hypertension   . Lupus    "presents as joint pain, occ. rash  . Lupus  arthritis (HCC)   . MCTD (mixed connective tissue disease) (HCC) 03/28/2016   + ANA, + Ro, + RNP  . Osteoarthritis of both hands 03/28/2016  . Osteoarthritis of both knees 03/28/2016   Moderate     Family History  Problem Relation Age of Onset  . Pulmonary embolism Mother   . Cancer Father        colon  . COPD Other   . Hypertension Other   . Hyperlipidemia Other   . Stroke Other   . Diabetes Other   . Cancer Other   . Heart disease Other   . Rheum arthritis Sister   . Autoimmune disease Daughter   . Aarna Mihalko' disease Son    Past Surgical History:  Procedure Laterality Date  . ABDOMINAL HYSTERECTOMY    . BREATH TEK H PYLORI N/A 09/14/2013   Procedure: BREATH TEK H PYLORI;  Surgeon: Kandis Cocking, MD;  Location: [facility];  Service: General;  Laterality: N/A;  . CESAREAN SECTION    . GASTRIC ROUX-EN-Y N/A 03/14/2014   Procedure: LAPAROSCOPIC ROUX-EN-Y GASTRIC BYPASS WITH UPPER ENDOSCOPY;  Surgeon: Ovidio Kin, MD;  Location: [facility];  Service: General;  Laterality: N/A;  . right foot surgery    . TUBAL LIGATION     Social History   Social History Narrative  . Not on file     Objective: Vital Signs: There were no vitals taken for this visit.   Physical Exam   Musculoskeletal Exam: ***  CDAI Exam: No CDAI exam completed.  Investigation: No additional findings.   Imaging: No results found.  Speciality Comments: No specialty comments available.    Procedures:  No procedures performed Allergies: Patient has no known allergies.   Assessment / Plan:     Visit Diagnoses: No diagnosis found.    Orders: No orders of the defined types were placed in this encounter.  No orders of the defined types were placed in this encounter.   Face-to-face time spent with patient was *** minutes. 50% of time was spent in counseling and coordination of care.  Follow-Up Instructions: No follow-ups on file.   Ellen Henri, CMA  Note - This record has been  created using Animal nutritionist.  Chart creation errors have been sought, but may not always  have been located. Such creation errors do not reflect on  the standard of medical care.

## 2017-10-22 ENCOUNTER — Ambulatory Visit: Payer: BLUE CROSS/BLUE SHIELD | Admitting: Rheumatology

## 2018-01-15 NOTE — Progress Notes (Signed)
Office Visit Note  Patient: Holly Davidson             Date of Birth: 1965-10-17           MRN: [id number]             PCP: Mikael Spray, NP Referring: Mikael Spray, NP Visit Date: 01/19/2018 Occupation: @GUAROCC @  Subjective:  Pain in both knees   History of Present Illness: Holly Davidson is a 52 y.o. female with history of mixed connective tissue disease osteoarthritis, fibromyalgia, DDD.  She states she has been having a lot of discomfort in her bilateral knee joints.  She states she had x-rays done by her PCP and was told that she has osteoarthritis.  She gives a recent episode of rash on her face which has resolved on.  She continues to have dry mouth and dry eyes.  She has aching in her hands.  Lower back pain persist.  She continues to have some discomfort from fibromyalgia.  Insomnia persists.  Activities of Daily Living:  Patient reports morning stiffness for 1-2 hours.   Patient Reports nocturnal pain.  Difficulty dressing/grooming: Reports Difficulty climbing stairs: Reports Difficulty getting out of chair: Reports Difficulty using hands for taps, buttons, cutlery, and/or writing: Denies  Review of Systems  Constitutional: Positive for fatigue. Negative for night sweats, weight gain and weight loss.  HENT: Positive for mouth dryness. Negative for mouth sores, trouble swallowing, trouble swallowing and nose dryness.   Eyes: Positive for dryness. Negative for pain, redness and visual disturbance.  Respiratory: Negative for cough, shortness of breath and difficulty breathing.   Cardiovascular: Negative for chest pain, palpitations, hypertension, irregular heartbeat and swelling in legs/feet.  Gastrointestinal: Negative for blood in stool, constipation and diarrhea.  Endocrine: Negative for increased urination.  Genitourinary: Negative for vaginal dryness.  Musculoskeletal: Positive for arthralgias, joint pain, joint swelling, myalgias, morning stiffness and myalgias.  Negative for muscle weakness and muscle tenderness.  Skin: Positive for rash and hair loss. Negative for color change, skin tightness, ulcers and sensitivity to sunlight.  Allergic/Immunologic: Negative for susceptible to infections.  Neurological: Negative for dizziness, memory loss, night sweats and weakness.  Hematological: Negative for swollen glands.  Psychiatric/Behavioral: Positive for depressed mood and sleep disturbance. The patient is not nervous/anxious.     PMFS History:  Patient Active Problem List   Diagnosis Date Noted  . Other fatigue 12/18/2016  . Primary insomnia 12/18/2016  . Vitamin D deficiency 12/18/2016  . Former smoker 12/18/2016  . History of cholecystectomy 12/18/2016  . History of gastric bypass 12/18/2016  . Osteoarthritis of both hands 03/28/2016  . 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 MCTD (mixed connective tissue disease) (HCC) + ANA, + Ro, + RNP 03/28/2016  . Osteoarthritis of both knees 03/28/2016  . DDD (degenerative disc disease), lumbar 03/28/2016  . Fibromyalgia 03/28/2016  . Morbid obesity, Initial weight - 282, BMI - 42.7 09/09/2012    Past Medical History:  Diagnosis Date  . Anemia   . Anxiety   . Arthritis   . Back pain    "OFF & ON"  . 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 DDD (degenerative disc disease), lumbar 03/28/2016  . Difficulty sleeping    occasional - takes med prn  . Fibromyalgia   . Herniated lumbar intervertebral disc   . Hypertension   . Lupus (HCC)    "presents as joint pain, occ. rash  . Lupus arthritis (HCC)   .  MCTD (mixed connective tissue disease) (HCC) 03/28/2016   + ANA, + Ro, + RNP  . Osteoarthritis of both hands 03/28/2016  . Osteoarthritis of both knees 03/28/2016   Moderate     Family History  Problem Relation Age of Onset  . Pulmonary embolism Mother   . Cancer Father        colon  . COPD Other   . Hypertension Other   . Hyperlipidemia Other   . Stroke Other   .  Diabetes Other   . Cancer Other   . Heart disease Other   . Rheum arthritis Sister   . Autoimmune disease Daughter   . Graves' disease Son    Past Surgical History:  Procedure Laterality Date  . ABDOMINAL HYSTERECTOMY    . BREATH TEK H PYLORI N/A 09/14/2013   Procedure: BREATH TEK H PYLORI;  Surgeon: Kandis Cockingavid H Newman, MD;  [facility];  Service: General;  Laterality: N/A;  . CESAREAN SECTION    . 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 right foot surgery    . TUBAL LIGATION     Social History   Social History Narrative  . Not on file    Objective: Vital Signs: BP 114/74 (BP Location: Left Arm, Patient Position: Sitting, Cuff Size: Large)   Pulse 93   Resp 14   Ht 5\' 7"  (1.702 m)   Wt 249 lb (112.9 kg)   BMI 39.00 kg/m    Physical Exam  Constitutional: She is oriented to person, place, and time. She appears well-developed and well-nourished.  HENT:  Head: Normocephalic and atraumatic.  Eyes: Conjunctivae and EOM are normal.  Neck: Normal range of motion.  Cardiovascular: Normal rate, regular rhythm, normal heart sounds and intact distal pulses.  Pulmonary/Chest: Effort normal and breath sounds normal.  Abdominal: Soft. Bowel sounds are normal.  Lymphadenopathy:    She has no cervical adenopathy.  Neurological: She is alert and oriented to person, place, and time.  Skin: Skin is warm and dry. Capillary refill takes less than 2 seconds.  Psychiatric: She has a normal mood and affect. Her behavior is normal.  Nursing note and vitals reviewed.    Musculoskeletal Exam: C-spine thoracic lumbar spine limited range of motion.  Shoulder joints elbow joints wrist joint MCPs PIPs DIPs were in good range of motion without synovitis.  Hip joints knee joints ankles MTPs PIPs were in good range of motion without synovitis.   She had discomfort range of motion of bilateral knee joints.  CDAI Exam: CDAI Score: Not documented Patient Global Assessment: Not documented; Provider Global Assessment: Not documented Swollen: Not documented; Tender: Not documented Joint Exam   Not documented   There is currently no information documented on the homunculus. [name] and complete the homunculus joint exam.  Investigation: No additional findings.  Imaging: No results found.  Recent Labs: Lab Results  Component Value Date   WBC 6.4 04/23/2017   HGB 11.5 (L) 04/23/2017   PLT 377 04/23/2017   NA 137 04/23/2017   K 4.2 04/23/2017   CL 100 04/23/2017   CO2 29 04/23/2017   GLUCOSE 87 04/23/2017   BUN 10 04/23/2017   CREATININE 0.75 04/23/2017   BILITOT 0.4 04/23/2017   ALKPHOS 53 05/08/2014   AST 17 04/23/2017   ALT 14 04/23/2017   PROT 7.5 04/23/2017  ALBUMIN 3.9 05/08/2014   CALCIUM 9.4 04/23/2017   GFRAA 106 04/23/2017  Number 10/2016 ANA 1: 320 nuclear, dsDNA negative, SSA positive, SSB negative, RNP positive, SCL 70-, Smith negative, C3-C4 normal  Speciality Comments: No specialty comments available.  Procedures:  Large Joint Inj: bilateral knee on 01/19/2018 2:08 PM Indications: pain Details: 27 G 1.5 in needle, medial approach  Arthrogram: No  Medications (Right): 1.5 mL lidocaine 1 %; 40 mg triamcinolone acetonide 40 MG/ML Aspirate (Right): 0 mL Medications (Left): 1.5 mL lidocaine 1 %; 40 mg triamcinolone acetonide 40 MG/ML Aspirate (Left): 0 mL Outcome: tolerated well, no immediate complications Procedure, treatment alternatives, risks and benefits explained, specific risks discussed. Consent was given by the patient. Immediately prior to procedure a time out was called to verify the correct patient, procedure, equipment, support staff and site/side marked as required. Patient was prepped and draped in the usual sterile fashion.     Allergies: Patient has no known  allergies.   Assessment / Plan:     Visit Diagnoses: MCTD (mixed connective tissue disease) (HCC) + ANA, + Ro, + RNP - +ANA, +Ro, +RNP, photo, sicca.  Patient states she has intermittent rash and continues to have sicca symptoms.  She could not tolerate Plaquenil as she felt it was causing ocular toxicity.  I will check her labs again today which are listed as follows.She saw Dr. Gala Romney last year and had echocardiogram and PFTs.  He noted that DLCO was mildly decreased due to history of prior smoking.  There was no evidence of pulmonary hypertension.  I have advised her to schedule a follow-up visit with them.  Primary osteoarthritis of both hands-joint protection muscle strengthening was discussed.  Pain in both knees-she has been having increased pain in her bilateral knee joints and difficulty walking.  Weight loss diet and exercise was discussed.  After different treatment options were discussed bilateral knee joints were injected with cortisone.  Patient states that she had recent x-rays done by PCP.  Have advised her to bring a copy at the next visit.    Primary osteoarthritis of both knees  Chondromalacia of both patellae-chronic pain.  Fibromyalgia - She is currently taking Flexeril, Voltaren 75 mg tablet, Cymbalta, Lyrica, Lexapro, and Trazodone.  She continues to be a lot of discomfort.  DDD (degenerative disc disease), lumbar-chronic pain.  Primary insomnia-good sleep hygiene was discussed.  Other fatigue-related to insomnia.  Vitamin D deficiency-she is on supplement.  Other medical problems are listed as follows:  History of anemia  History of cholecystectomy  History of gastric bypass  History of depression - Patient is on Lexapro 20 mg tablet.    History of anxiety - Patient is on Lexapro 20 mg tablet.    History of hypertension  Former smoker   Orders: Orders Placed This Encounter  Procedures  . Large Joint Inj  . CBC with Differential/Platelet  .  COMPLETE METABOLIC PANEL WITH GFR  . Urinalysis, Routine w reflex microscopic  . ANA  . C3 and C4  . Sedimentation rate  . Anti-Smith antibody  . RNP Antibody  . Sjogrens syndrome-A extractable nuclear antibody  . Anti-DNA antibody, double-stranded   No orders of the defined types were placed in this encounter.   Face-to-face time spent with patient was 30 minutes. Greater than 50% of time was spent in counseling and coordination of care.  Follow-Up Instructions: Return in about 6 months (around 07/20/2018) for MCTD, OA, FMS.   Pollyann Savoy, MD  Note -  This record has been created using [employer].  Chart creation errors have been sought, but may not always  have been located. Such creation errors do not reflect on  the standard of medical care.

## 2018-01-19 ENCOUNTER — Encounter: Payer: Self-pay | Admitting: Rheumatology

## 2018-01-19 ENCOUNTER — Ambulatory Visit (INDEPENDENT_AMBULATORY_CARE_PROVIDER_SITE_OTHER): Payer: BLUE CROSS/BLUE SHIELD | Admitting: Rheumatology

## 2018-01-19 VITALS — BP 114/74 | HR 93 | Resp 14 | Ht 67.0 in | Wt 249.0 lb

## 2018-01-19 DIAGNOSIS — Z9884 Bariatric surgery status: Secondary | ICD-10-CM

## 2018-01-19 DIAGNOSIS — M25561 Pain in right knee: Secondary | ICD-10-CM

## 2018-01-19 DIAGNOSIS — M170 Bilateral primary osteoarthritis of knee: Secondary | ICD-10-CM | POA: Diagnosis not present

## 2018-01-19 DIAGNOSIS — R5383 Other fatigue: Secondary | ICD-10-CM

## 2018-01-19 DIAGNOSIS — M25562 Pain in left knee: Secondary | ICD-10-CM

## 2018-01-19 DIAGNOSIS — G8929 Other chronic pain: Secondary | ICD-10-CM

## 2018-01-19 DIAGNOSIS — F5101 Primary insomnia: Secondary | ICD-10-CM

## 2018-01-19 DIAGNOSIS — Z8679 Personal history of other diseases of the circulatory system: Secondary | ICD-10-CM

## 2018-01-19 DIAGNOSIS — M51369 Other intervertebral disc degeneration, lumbar region without mention of lumbar back pain or lower extremity pain: Secondary | ICD-10-CM

## 2018-01-19 DIAGNOSIS — M19042 Primary osteoarthritis, left hand: Secondary | ICD-10-CM

## 2018-01-19 DIAGNOSIS — M351 Other overlap syndromes: Secondary | ICD-10-CM

## 2018-01-19 DIAGNOSIS — M2241 Chondromalacia patellae, right knee: Secondary | ICD-10-CM

## 2018-01-19 DIAGNOSIS — Z9049 Acquired absence of other specified parts of digestive tract: Secondary | ICD-10-CM

## 2018-01-19 DIAGNOSIS — Z862 Personal history of diseases of the blood and blood-forming organs and certain disorders involving the immune mechanism: Secondary | ICD-10-CM

## 2018-01-19 DIAGNOSIS — M5136 Other intervertebral disc degeneration, lumbar region: Secondary | ICD-10-CM

## 2018-01-19 DIAGNOSIS — M797 Fibromyalgia: Secondary | ICD-10-CM

## 2018-01-19 DIAGNOSIS — M19041 Primary osteoarthritis, right hand: Secondary | ICD-10-CM | POA: Diagnosis not present

## 2018-01-19 DIAGNOSIS — E559 Vitamin D deficiency, unspecified: Secondary | ICD-10-CM

## 2018-01-19 DIAGNOSIS — Z87891 Personal history of nicotine dependence: Secondary | ICD-10-CM

## 2018-01-19 DIAGNOSIS — Z8659 Personal history of other mental and behavioral disorders: Secondary | ICD-10-CM

## 2018-01-19 DIAGNOSIS — M2242 Chondromalacia patellae, left knee: Secondary | ICD-10-CM

## 2018-01-19 MED ORDER — TRIAMCINOLONE ACETONIDE 40 MG/ML IJ SUSP
40.0000 mg | INTRAMUSCULAR | Status: AC | PRN
  Administered 2018-01-19: 40 mg via INTRA_ARTICULAR

## 2018-01-19 MED ORDER — LIDOCAINE HCL 1 % IJ SOLN
1.5000 mL | INTRAMUSCULAR | Status: AC | PRN
  Administered 2018-01-19: 1.5 mL

## 2018-01-20 NOTE — Progress Notes (Signed)
Labs show anemia and elevated creatinine.  She should not take any anti-inflammatories.  I also see that she is on hydrochlorothiazide.  Please advise her to discuss lab results with her PCP due to elevation of her creatinine.  Please forward lab results to her PCP.  Her urine shows moderate bacteria and nitrite.  Which indicates urinary tract infection.  Please advise her to her PCP regarding possible UTI.  Autoimmune labs are still pending.

## 2018-01-20 NOTE — Progress Notes (Signed)
All autoimmune labs are stable.  She did not want to take any immunosuppressive agents.

## 2018-01-24 LAB — CBC WITH DIFFERENTIAL/PLATELET
BASOS PCT: 0.2 %
Basophils Absolute: 19 cells/uL (ref 0–200)
EOS ABS: 76 {cells}/uL (ref 15–500)
Eosinophils Relative: 0.8 %
HEMATOCRIT: 34 % — AB (ref 35.0–45.0)
Hemoglobin: 10.6 g/dL — ABNORMAL LOW (ref 11.7–15.5)
Lymphs Abs: 4427 cells/uL — ABNORMAL HIGH (ref 850–3900)
MCH: 23.2 pg — AB (ref 27.0–33.0)
MCHC: 31.2 g/dL — ABNORMAL LOW (ref 32.0–36.0)
MCV: 74.4 fL — AB (ref 80.0–100.0)
MPV: 9.1 fL (ref 7.5–12.5)
Monocytes Relative: 12.8 %
Neutro Abs: 3762 cells/uL (ref 1500–7800)
Neutrophils Relative %: 39.6 %
PLATELETS: 344 10*3/uL (ref 140–400)
RBC: 4.57 10*6/uL (ref 3.80–5.10)
RDW: 15.6 % — ABNORMAL HIGH (ref 11.0–15.0)
TOTAL LYMPHOCYTE: 46.6 %
WBC: 9.5 10*3/uL (ref 3.8–10.8)
WBCMIX: 1216 {cells}/uL — AB (ref 200–950)

## 2018-01-24 LAB — URINALYSIS, ROUTINE W REFLEX MICROSCOPIC
BILIRUBIN URINE: NEGATIVE
GLUCOSE, UA: NEGATIVE
HYALINE CAST: NONE SEEN /LPF
Hgb urine dipstick: NEGATIVE
Ketones, ur: NEGATIVE
Leukocytes, UA: NEGATIVE
Nitrite: POSITIVE — AB
PROTEIN: NEGATIVE
RBC / HPF: NONE SEEN /HPF (ref 0–2)
SPECIFIC GRAVITY, URINE: 1.009 (ref 1.001–1.03)
Squamous Epithelial / LPF: NONE SEEN /HPF (ref <=5)
WBC, UA: NONE SEEN /HPF (ref 0–5)
pH: <=5 (ref 5.0–8.0)

## 2018-01-24 LAB — SEDIMENTATION RATE: Sed Rate: 28 mm/h (ref 0–30)

## 2018-01-24 LAB — COMPLETE METABOLIC PANEL WITH GFR
AG RATIO: 1.1 (calc) (ref 1.0–2.5)
ALBUMIN MSPROF: 3.8 g/dL (ref 3.6–5.1)
ALT: 15 U/L (ref 6–29)
AST: 18 U/L (ref 10–35)
Alkaline phosphatase (APISO): 81 U/L (ref 33–130)
BILIRUBIN TOTAL: 0.3 mg/dL (ref 0.2–1.2)
BUN / CREAT RATIO: 15 (calc) (ref 6–22)
BUN: 21 mg/dL (ref 7–25)
CHLORIDE: 98 mmol/L (ref 98–110)
CO2: 34 mmol/L — ABNORMAL HIGH (ref 20–32)
Calcium: 8.7 mg/dL (ref 8.6–10.4)
Creat: 1.37 mg/dL — ABNORMAL HIGH (ref 0.50–1.05)
GFR, EST AFRICAN AMERICAN: 51 mL/min/{1.73_m2} — AB (ref >=60)
GFR, Est Non African American: 44 mL/min/{1.73_m2} — ABNORMAL LOW (ref >=60)
GLOBULIN: 3.6 g/dL (ref 1.9–3.7)
Glucose, Bld: 69 mg/dL (ref 65–99)
POTASSIUM: 3.5 mmol/L (ref 3.5–5.3)
SODIUM: 137 mmol/L (ref 135–146)
TOTAL PROTEIN: 7.4 g/dL (ref 6.1–8.1)

## 2018-01-24 LAB — ANTI-NUCLEAR AB-TITER (ANA TITER): ANA Titer 1: 1:160 {titer} — ABNORMAL HIGH

## 2018-01-24 LAB — RNP ANTIBODY: RIBONUCLEIC PROTEIN(ENA) ANTIBODY, IGG: POSITIVE AI — AB

## 2018-01-24 LAB — ANTI-SMITH ANTIBODY: ENA SM AB SER-ACNC: NEGATIVE AI

## 2018-01-24 LAB — ANA: ANA: POSITIVE — AB

## 2018-01-24 LAB — C3 AND C4
C3 COMPLEMENT: 128 mg/dL (ref 83–193)
C4 Complement: 32 mg/dL (ref 15–57)

## 2018-01-24 LAB — SJOGRENS SYNDROME-A EXTRACTABLE NUCLEAR ANTIBODY: SSA (Ro) (ENA) Antibody, IgG: 7.7 AI — AB

## 2018-01-24 LAB — ANTI-DNA ANTIBODY, DOUBLE-STRANDED: ds DNA Ab: <1 IU/mL

## 2018-01-29 ENCOUNTER — Telehealth: Payer: Self-pay | Admitting: Rheumatology

## 2018-01-29 NOTE — Telephone Encounter (Signed)
Patient will be moving ASAP due to 14 stairs she has to go up, and down. Patient is getting where she is unable to go up, and down them. Patient is requesting a note from the doctor stating she needs an appt downstairs due to her diagnosis. Please call to discuss.

## 2018-02-01 NOTE — Telephone Encounter (Signed)
Ok per Dr. Corliss Skainseveshwar.

## 2018-02-02 ENCOUNTER — Encounter: Payer: Self-pay | Admitting: *Deleted

## 2018-02-02 NOTE — Telephone Encounter (Signed)
Advised patient letter is ready for pick up

## 2018-02-02 NOTE — Telephone Encounter (Signed)
Letter written

## 2018-03-03 ENCOUNTER — Other Ambulatory Visit (HOSPITAL_COMMUNITY): Payer: Self-pay | Admitting: *Deleted

## 2018-03-03 ENCOUNTER — Telehealth (HOSPITAL_COMMUNITY): Payer: Self-pay | Admitting: Internal Medicine

## 2018-03-03 DIAGNOSIS — M351 Other overlap syndromes: Secondary | ICD-10-CM

## 2018-03-03 NOTE — Telephone Encounter (Signed)
Called and left message for patient to call back.  Need to give her the appt info for her PFT appt.

## 2018-03-03 NOTE — Progress Notes (Signed)
Pt due for yearly echo, pfts and appt w/Dr Bensimhon, orders placed will schedule

## 2018-03-10 NOTE — Telephone Encounter (Signed)
Called and spoke with patient.  She is aware of appt and time for PFTs prior to Echo and f/u with Dr. Gala Romney on 05/07/18.

## 2018-03-25 ENCOUNTER — Encounter: Payer: Self-pay | Admitting: Physician Assistant

## 2018-04-02 NOTE — Progress Notes (Signed)
Office Visit Note  Patient: Holly Davidson             Date of Birth: [birth year]           MRN: [id number]             PCP: Welford Roche, NP Referring: Welford Roche, NP Visit Date: 04/05/2018 Occupation: @GUAROCC @  Subjective:  Pain in multiple joints   History of Present Illness: Holly Davidson is a 52 y.o. female  with history of mixed connective tissue disease osteoarthritis, fibromyalgia, and DDD.  She reports that she has been having pain in multiple joints including bilateral knees, bilateral ankle joints, bilateral wrist joints, bilateral elbow joints.  She continues to see Dr. daughter for bilateral knee pain.  She has had gel Visco injections in the past and will be following up with Dr. Debbora Dus on 04/12/2018.  She reports that she is been having increased lower back pain and that her daughter plans on ordering an MRI.  After discontinuing diclofenac 75 mg tablets and Loxitane she is been having increased pain in multiple joints.  She states that she is also noticed intermittent facial rashes.  She states she is also noticed increased bruising.  She states that she has recurrent oral ulcerations but denies any sores in her nose.  She denies any symptoms of Raynauds.  She continues to have sicca symptoms.  She has tried using Biotene products as well as eyedrops in the past without any relief.  She continues have generalized muscle aches and muscle tenderness due to fibromyalgia.  She has chronic fatigue but states that she sleeps well at night.     Activities of Daily Living:  Patient reports morning stiffness for 1  hour.   Patient Reports nocturnal pain.  Difficulty dressing/grooming: Denies Difficulty climbing stairs: Reports Difficulty getting out of chair: Reports Difficulty using hands for taps, buttons, cutlery, and/or writing: Denies  Review of Systems  Constitutional: Positive for fatigue. Negative for night sweats, weight gain and weight loss.  HENT: Positive for  mouth sores and mouth dryness. Negative for trouble swallowing, trouble swallowing and nose dryness.   Eyes: Positive for dryness. Negative for pain, redness and visual disturbance.  Respiratory: Negative for cough, hemoptysis, shortness of breath and difficulty breathing.   Cardiovascular: Negative for chest pain, palpitations, hypertension, irregular heartbeat and swelling in legs/feet.  Gastrointestinal: Positive for constipation. Negative for blood in stool and diarrhea.  Endocrine: Negative for increased urination.  Genitourinary: Negative for painful urination and vaginal dryness.  Musculoskeletal: Positive for arthralgias, joint pain, joint swelling, myalgias, morning stiffness, muscle tenderness and myalgias. Negative for muscle weakness.  Skin: Positive for rash. Negative for color change, pallor, hair loss, nodules/bumps, skin tightness, ulcers and sensitivity to sunlight.  Allergic/Immunologic: Negative for susceptible to infections.  Neurological: Negative for dizziness, numbness, headaches, memory loss, night sweats and weakness.  Hematological: Negative for swollen glands.  Psychiatric/Behavioral: Positive for depressed mood. Negative for sleep disturbance. The patient is not nervous/anxious.     PMFS History:  Patient Active Problem List   Diagnosis Date Noted  . Other fatigue 12/18/2016  . Primary insomnia 12/18/2016  . Vitamin D deficiency 12/18/2016  . Former smoker 12/18/2016  . History of cholecystectomy 12/18/2016  . History of gastric bypass 12/18/2016  . Osteoarthritis of both hands 03/28/2016  . 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 MCTD (mixed connective tissue disease) ([place]) + ANA, + Ro, + RNP 03/28/2016  . Osteoarthritis of both knees 03/28/2016  .  DDD (degenerative disc disease), lumbar 03/28/2016  . Fibromyalgia 03/28/2016  . Morbid obesity, Initial weight - 282, BMI - 42.7 09/09/2012    Past Medical History:  Diagnosis Date  . Anemia   .  Anxiety   . Arthritis   . Back pain    "OFF & ON"  . 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 DDD (degenerative disc disease), lumbar 03/28/2016  . Difficulty sleeping    occasional - takes med prn  . Fibromyalgia   . Herniated lumbar intervertebral disc   . Hypertension   . Lupus ([place])    "presents as joint pain, occ. rash  . Lupus arthritis ([place])   . MCTD (mixed connective tissue disease) ([place]) 03/28/2016   + ANA, + Ro, + RNP  . Osteoarthritis of both hands 03/28/2016  . Osteoarthritis of both knees 03/28/2016   Moderate     Family History  Problem Relation Age of Onset  . Pulmonary embolism Mother   . Cancer Father        colon  . COPD Other   . Hypertension Other   . Hyperlipidemia Other   . Stroke Other   . Diabetes Other   . Cancer Other   . Heart disease Other   . Rheum arthritis Sister   . Autoimmune disease Daughter   . Graves' disease Son    Past Surgical History:  Procedure Laterality Date  . ABDOMINAL HYSTERECTOMY    . BREATH TEK H PYLORI N/A 09/14/2013   Procedure: BREATH TEK H PYLORI;  Surgeon: Shann Medal, MD;  Location: [facility];  Service: General;  Laterality: N/A;  . CESAREAN SECTION    . GASTRIC ROUX-EN-Y N/A 03/14/2014   Procedure: LAPAROSCOPIC ROUX-EN-Y GASTRIC BYPASS WITH UPPER ENDOSCOPY;  Surgeon: Alphonsa Overall, MD;  Location: [facility];  Service: General;  Laterality: N/A;  . right foot surgery    . TUBAL LIGATION     Social History   Social History Narrative  . Not on file    Objective: Vital Signs: BP (!) 144/76 (BP Location: Left Arm, Patient Position: Sitting, Cuff Size: Large)   Pulse 77   Resp 15   Ht 5' 6"  (1.676 m)   Wt 254 lb 9.6 oz (115.5 kg)   BMI 41.09 kg/m    Physical Exam  Constitutional: She is oriented to person, place, and time. She appears well-developed and well-nourished.  HENT:  Head: Normocephalic and atraumatic.  No oral or nasal ulcerations.  No parotid swelling.    Eyes: Conjunctivae and  EOM are normal.  Neck: Normal range of motion.  Cardiovascular: Normal rate, regular rhythm, normal heart sounds and intact distal pulses.  Pulmonary/Chest: Effort normal and breath sounds normal.  Abdominal: Soft. Bowel sounds are normal.  Lymphadenopathy:    She has no cervical adenopathy.  Neurological: She is alert and oriented to person, place, and time.  Skin: Skin is warm and dry. Capillary refill takes less than 2 seconds.  No malar rash noted.  No digital ulcerations or signs of gangrene.   Psychiatric: She has a normal mood and affect. Her behavior is normal.  Nursing note and vitals reviewed.    Musculoskeletal Exam: C-spine, thoracic spine, lumbar spine good range of motion.  No midline spinal tenderness.  No SI joint tenderness.  Shoulder joints good range of motion with no discomfort.  Elbow joints wrist joint MCPs PIPs DIPs been good range of motion with no synovitis.  Hip joints knee joints ankles  MTPs PIPs been good range of motion with no synovitis.  CDAI Exam: CDAI Score: Not documented Patient Global Assessment: Not documented; Provider Global Assessment: Not documented Swollen: Not documented; Tender: Not documented Joint Exam   Not documented   There is currently no information documented on the homunculus. [name] and complete the homunculus joint exam.  Investigation: No additional findings.  Imaging: No results found.  Recent Labs: Lab Results  Component Value Date   WBC 9.5 01/19/2018   HGB 10.6 (L) 01/19/2018   PLT 344 01/19/2018   NA 137 01/19/2018   K 3.5 01/19/2018   CL 98 01/19/2018   CO2 34 (H) 01/19/2018   GLUCOSE 69 01/19/2018   BUN 21 01/19/2018   CREATININE 1.37 (H) 01/19/2018   BILITOT 0.3 01/19/2018   ALKPHOS 53 05/08/2014   AST 18 01/19/2018   ALT 15 01/19/2018   PROT 7.4 01/19/2018   ALBUMIN 3.9 05/08/2014   CALCIUM 8.7 01/19/2018   GFRAA 51 (L) 01/19/2018  March 26, 2018 uric acid 6.4, ANA positive  but no titer given.,  RNP positive, Ro positive, (Smith, SSB and anti-DNA negative, ESR 91, RF 10.6  Speciality Comments: No specialty comments available.  Procedures:  No procedures performed Allergies: Patient has no known allergies.   Assessment / Plan:     Visit Diagnoses: MCTD (mixed connective tissue disease) ([place]) + ANA, + Ro, + RNP - +ANA, +Ro, +RNP, photo, sicca.  Patient has been experiencing increased pain and discomfort.  Her sedimentation rate was elevated at 91 recently.  She gives history of frequent swelling in her joints.  She has been also having increased sicca symptoms photosensitivity.  Has been having infrequent oral ulcers.  Detailed discussion regarding different treatment options.  Her creatinine is elevated which she believes is due to NSAID use.  She has discontinued NSAIDs now.  I will check her BMP today.  Different treatment options and their side effects were discussed.  She was given a handout on Imuran.  Side effects were reviewed and informed consent was taken.  The plan is to start her on Imuran 50 mg p.o. daily and increase it to 100 mg p.o. daily if the labs are stable.  We will check labs in 2 weeks, 4 weeks and then every 2 months.  High risk medication use-she could not tolerate Plaquenil in the past.  She states it caused some vision issues.  Primary osteoarthritis of both hands-she has some underlying osteoarthritis in her hands.  No synovitis was noted.  Primary osteoarthritis of both knees-he complains of intermittent swelling in her knee joints.  No warmth swelling or effusion was noted today.  Chondromalacia of both patellae  Fibromyalgia - Flexeril, Cymbalta, Lyrica, Lexapro, and Trazodone.    DDD (degenerative disc disease), lumbar-she has chronic discomfort in her lower back.  Primary insomnia-she has primary insomnia which causes fatigue.  Other fatigue-related to insomnia and fibromyalgia.   Other medical problems are listed as  follows: History of anemia  Vitamin D deficiency  History of cholecystectomy  History of gastric bypass  History of depression  History of anxiety  History of hypertension  Former smoker   Orders: No orders of the defined types were placed in this encounter.  No orders of the defined types were placed in this encounter.   Face-to-face time spent with patient was 30 minutes. Greater than 50% of time was spent in counseling and coordination of care.  Follow-Up Instructions: Return in about 3  months (around 07/06/2018) for MCTD, Osteoarthritis, Fibromyalgia, DDD.   Bo Merino, MD  Note - This record has been created using [employer].  Chart creation errors have been sought, but may not always  have been located. Such creation errors do not reflect on  the standard of medical care.

## 2018-04-05 ENCOUNTER — Encounter: Payer: Self-pay | Admitting: Physician Assistant

## 2018-04-05 ENCOUNTER — Ambulatory Visit (INDEPENDENT_AMBULATORY_CARE_PROVIDER_SITE_OTHER): Payer: BLUE CROSS/BLUE SHIELD | Admitting: Rheumatology

## 2018-04-05 VITALS — BP 144/76 | HR 77 | Resp 15 | Ht 66.0 in | Wt 254.6 lb

## 2018-04-05 DIAGNOSIS — M170 Bilateral primary osteoarthritis of knee: Secondary | ICD-10-CM

## 2018-04-05 DIAGNOSIS — M19041 Primary osteoarthritis, right hand: Secondary | ICD-10-CM | POA: Diagnosis not present

## 2018-04-05 DIAGNOSIS — Z9884 Bariatric surgery status: Secondary | ICD-10-CM

## 2018-04-05 DIAGNOSIS — R5383 Other fatigue: Secondary | ICD-10-CM

## 2018-04-05 DIAGNOSIS — Z79899 Other long term (current) drug therapy: Secondary | ICD-10-CM | POA: Diagnosis not present

## 2018-04-05 DIAGNOSIS — Z8679 Personal history of other diseases of the circulatory system: Secondary | ICD-10-CM

## 2018-04-05 DIAGNOSIS — M2242 Chondromalacia patellae, left knee: Secondary | ICD-10-CM

## 2018-04-05 DIAGNOSIS — E559 Vitamin D deficiency, unspecified: Secondary | ICD-10-CM

## 2018-04-05 DIAGNOSIS — M351 Other overlap syndromes: Secondary | ICD-10-CM

## 2018-04-05 DIAGNOSIS — M5136 Other intervertebral disc degeneration, lumbar region: Secondary | ICD-10-CM

## 2018-04-05 DIAGNOSIS — Z87891 Personal history of nicotine dependence: Secondary | ICD-10-CM

## 2018-04-05 DIAGNOSIS — M797 Fibromyalgia: Secondary | ICD-10-CM

## 2018-04-05 DIAGNOSIS — Z8659 Personal history of other mental and behavioral disorders: Secondary | ICD-10-CM

## 2018-04-05 DIAGNOSIS — Z9049 Acquired absence of other specified parts of digestive tract: Secondary | ICD-10-CM

## 2018-04-05 DIAGNOSIS — M2241 Chondromalacia patellae, right knee: Secondary | ICD-10-CM

## 2018-04-05 DIAGNOSIS — F5101 Primary insomnia: Secondary | ICD-10-CM

## 2018-04-05 DIAGNOSIS — Z862 Personal history of diseases of the blood and blood-forming organs and certain disorders involving the immune mechanism: Secondary | ICD-10-CM

## 2018-04-05 DIAGNOSIS — M19042 Primary osteoarthritis, left hand: Secondary | ICD-10-CM

## 2018-04-05 MED ORDER — AZATHIOPRINE 50 MG PO TABS: ORAL_TABLET | ORAL | 2 refills | Status: DC

## 2018-04-05 NOTE — Patient Instructions (Signed)
Standing Labs We placed an order today for your standing lab work.    Please come back and get your standing labs in 2 weeks, then 4 weeks, then 6 weeks.  We have open lab Monday through Friday from 8:30-11:30 AM and 1:30-4:00 PM  at the office of Dr. Pollyann SavoyShaili Deveshwar.   You may experience shorter wait times on Monday and Friday afternoons. The office is located at [address], [address], [place], [place] [address] No appointment is necessary.   Labs are drawn by [facility].  You may receive a bill from [facility] for your lab work. If you have any questions regarding directions or hours of operation,  please call [phone].   Just as a reminder please drink plenty of water prior to coming for your lab work. Thanks!  Vaccines You are taking a medication(s) that can suppress your immune system.  The following immunizations are recommended: . Flu annually . Pneumonia (Pneumovax 23 and Prevnar 13 after age 53) . Shingrix  Please check with your PCP to make sure you are up to date.  Treating Dry Mouth  Dry Mouth (also called xerostomia) is a condition that occurs when your body doesn't produce enough saliva. Saliva is an essential body fluid for protection and preservation of the oral cavity and oral functions.  Dry mouth can cause complications such as difficulty swallowing, severe and progressive tooth decay, and oral infections (particularly fungal).   Below are products and other management strategies that can help relieve the symptoms and reduce complications of dry mouth.  . Keep your mouth moist by sipping small amounts of water during the day. Excessive sips of can reduce the oral mucus film and increase symptoms. . Avoid frequent intake of acidic and caffeinated beverages. . Increase salivary secretion by chewing gum containing no sugar or sucking sugar free hard candies. . Over-the-counter saliva substitutes such as Biotne( Moisturizing Gel, Oral Rinses and Mouth Spray), Oramoist  (patches), OraCoat (melts and lozenges).  Biotne also makes non-irritating toothpaste.

## 2018-04-06 LAB — BASIC METABOLIC PANEL WITH GFR
BUN: 10 mg/dL (ref 7–25)
CALCIUM: 9.1 mg/dL (ref 8.6–10.4)
CHLORIDE: 98 mmol/L (ref 98–110)
CO2: 35 mmol/L — AB (ref 20–32)
Creat: 0.83 mg/dL (ref 0.50–1.05)
GFR, EST NON AFRICAN AMERICAN: 81 mL/min/{1.73_m2} (ref >=60)
GFR, Est African American: 93 mL/min/{1.73_m2} (ref >=60)
GLUCOSE: 114 mg/dL — AB (ref 65–99)
POTASSIUM: 3.5 mmol/L (ref 3.5–5.3)
Sodium: 140 mmol/L (ref 135–146)

## 2018-04-19 ENCOUNTER — Telehealth: Payer: Self-pay | Admitting: Rheumatology

## 2018-04-19 DIAGNOSIS — Z79899 Other long term (current) drug therapy: Secondary | ICD-10-CM

## 2018-04-19 NOTE — Telephone Encounter (Signed)
Lab orders released.  

## 2018-04-19 NOTE — Progress Notes (Signed)
Office Visit Note  Patient: Holly Davidson             Date of Birth: May 20, 1965           MRN: [id number]             PCP: Welford Roche, NP Referring: Welford Roche, NP Visit Date: 04/29/2018 Occupation: @GUAROCC @  Subjective:  Pain in multiple joints    History of Present Illness: Holly Davidson is a 52 y.o. female with history of mixed connective tissue disease osteoarthritis, fibromyalgia, and DDD.   She was started on Imuran at her last visit on 04/05/18.  She has been tolerating it well without any side effects. She has not noticed any improvement since starting on Imuran.  She denies any recent rashes, sores in mouth and nose, swollen lymph nodes, hair loss, photosensitivity, or Raynaud's.  She continues to have chronic fatigue.  She continues to have sicca symptoms.  She continues to have pain in both hands, wrists, knees, and feet. She has generalized myalgias.  She was previously taking Diclofenac but discontinued due to abnormal renal function.  She was recently started on potassium supplements, which she has been having difficulty swallowing.   Activities of Daily Living:  Patient reports morning stiffness for 1 hour.   Patient Reports nocturnal pain.  Difficulty dressing/grooming: Reports Difficulty climbing stairs: Reports Difficulty getting out of chair: Reports Difficulty using hands for taps, buttons, cutlery, and/or writing: Reports  Review of Systems  Constitutional: Positive for fatigue.  HENT: Positive for mouth dryness. Negative for mouth sores, trouble swallowing and trouble swallowing.   Eyes: Positive for dryness. Negative for pain, redness and itching.  Respiratory: Negative for shortness of breath, wheezing and difficulty breathing.   Cardiovascular: Negative for palpitations and swelling in legs/feet.  Gastrointestinal: Positive for constipation. Negative for abdominal pain, blood in stool, diarrhea, nausea and vomiting.  Endocrine: Negative for  increased urination.  Genitourinary: Negative for painful urination and pelvic pain.  Musculoskeletal: Positive for arthralgias, joint pain, joint swelling and morning stiffness.  Skin: Negative for rash and hair loss.  Allergic/Immunologic: Negative for susceptible to infections.  Neurological: Positive for dizziness, memory loss and weakness. Negative for light-headedness and headaches.  Hematological: Negative for bruising/bleeding tendency.  Psychiatric/Behavioral: Positive for depressed mood and confusion. The patient is nervous/anxious.     PMFS History:  Patient Active Problem List   Diagnosis Date Noted  . Other fatigue 12/18/2016  . Primary insomnia 12/18/2016  . Vitamin D deficiency 12/18/2016  . Former smoker 12/18/2016  . History of cholecystectomy 12/18/2016  . History of gastric bypass 12/18/2016  . Osteoarthritis of both hands 03/28/2016  . 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 MCTD (mixed connective tissue disease) ([place]) + ANA, + Ro, + RNP 03/28/2016  . Osteoarthritis of both knees 03/28/2016  . DDD (degenerative disc disease), lumbar 03/28/2016  . Fibromyalgia 03/28/2016  . Morbid obesity, Initial weight - 282, BMI - 42.7 09/09/2012    Past Medical History:  Diagnosis Date  . Anemia   . Anxiety   . Arthritis   . Back pain    "OFF & ON"  . 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 DDD (degenerative disc disease), lumbar 03/28/2016  . Difficulty sleeping    occasional - takes med prn  . Fibromyalgia   . Herniated lumbar intervertebral disc   . Hypertension   . Lupus ([place])    "presents as joint pain, occ. rash  .  Lupus arthritis ([place])   . MCTD (mixed connective tissue disease) ([place]) 03/28/2016   + ANA, + Ro, + RNP  . Osteoarthritis of both hands 03/28/2016  . Osteoarthritis of both knees 03/28/2016   Moderate     Family History  Problem Relation Age of Onset  . Pulmonary embolism Mother   . Cancer Father        colon  . COPD  Other   . Hypertension Other   . Hyperlipidemia Other   . Stroke Other   . Diabetes Other   . Cancer Other   . Heart disease Other   . Rheum arthritis Sister   . Autoimmune disease Daughter   . Graves' disease Son    Past Surgical History:  Procedure Laterality Date  . ABDOMINAL HYSTERECTOMY    . BREATH TEK H PYLORI N/A 09/14/2013   Procedure: BREATH TEK H PYLORI;  Surgeon: Shann Medal, MD;  Location: [facility];  Service: General;  Laterality: N/A;  . CESAREAN SECTION    . GASTRIC ROUX-EN-Y N/A 03/14/2014   Procedure: LAPAROSCOPIC ROUX-EN-Y GASTRIC BYPASS WITH UPPER ENDOSCOPY;  Surgeon: Alphonsa Overall, MD;  Location: [facility];  Service: General;  Laterality: N/A;  . right foot surgery    . TUBAL LIGATION     Social History   Social History Narrative  . Not on file    Objective: Vital Signs: BP (!) 129/93 (BP Location: Left Wrist, Patient Position: Sitting, Cuff Size: Normal)   Pulse 79   Resp 15   Ht 5' 6"  (1.676 m)   Wt 253 lb (114.8 kg)   BMI 40.84 kg/m    Physical Exam Vitals signs and nursing note reviewed.  Constitutional:      Appearance: She is well-developed.  HENT:     Head: Normocephalic and atraumatic.     Comments: No oral or nasal ulcerations.  No parotid swelling. Eyes:     Conjunctiva/sclera: Conjunctivae normal.  Neck:     Musculoskeletal: Normal range of motion.  Cardiovascular:     Rate and Rhythm: Regular rhythm. Tachycardia present.     Heart sounds: Normal heart sounds.  Pulmonary:     Effort: Pulmonary effort is normal.     Breath sounds: Normal breath sounds.  Abdominal:     General: Bowel sounds are normal.     Palpations: Abdomen is soft.  Lymphadenopathy:     Cervical: No cervical adenopathy.  Skin:    General: Skin is warm and dry.     Capillary Refill: Capillary refill takes less than 2 seconds.     Comments: No malar rash.  No digital ulcerations or signs of gangrene.   Neurological:     Mental Status: She is alert and  oriented to person, place, and time.  Psychiatric:        Behavior: Behavior normal.      Musculoskeletal Exam: C-spine, thoracic, and lumbar spine good ROM.  No midline spinal tenderness.  No SI joint tenderness.  Shoulder joints, elbow joints, wrist joints, MCPs, PIPs, DIPs good range of motion with no synovitis.  Hip joints, knee joints and ankle joints and MTPs, PIPs, DIPs good range of motion with no synovitis.    CDAI Exam: CDAI Score: Not documented Patient Global Assessment: Not documented; Provider Global Assessment: Not documented Swollen: Not documented; Tender: Not documented Joint Exam   Not documented   There is currently no information documented on the homunculus. [name] and complete the homunculus joint exam.  Investigation: No additional findings.  Imaging: No results found.  Recent Labs: Lab Results  Component Value Date   WBC 10.9 (H) 04/21/2018   HGB 11.0 (L) 04/21/2018   PLT 380 04/21/2018   NA 138 04/21/2018   K 3.3 (L) 04/21/2018   CL 103 04/21/2018   CO2 25 04/21/2018   GLUCOSE 107 (H) 04/21/2018   BUN 11 04/21/2018   CREATININE 0.72 04/21/2018   BILITOT 0.2 04/21/2018   ALKPHOS 90 04/21/2018   AST 18 04/21/2018   ALT 11 04/21/2018   PROT 7.0 04/21/2018   ALBUMIN 4.0 04/21/2018   CALCIUM 9.7 04/21/2018   GFRAA 111 04/21/2018    Speciality Comments: No specialty comments available.  Procedures:  No procedures performed Allergies: Patient has no known allergies.   Current regimen includes Imuran 100 mg daily?  Started on 04/05/2018.  Most recent CBC/CMP stable on 04/21/2018.  Next CBC/CMP due in March and then every 3 months.  Standing orders placed. Recommend flu, Pneumovax 23, Prevnar 13, and Shingrix as indicated.  Assessment / Plan:     Visit Diagnoses: MCTD (mixed connective tissue disease) ([place]) + ANA, + Ro, + RNP: She has been having increased arthralgias and myalgias.  She has no synovitis on exam today.   She has noticed increased sicca symptoms.  She has no parotid swelling on exam.  She has not had any recent oral or nasal ulcerations.  She has not had any recent rashes.  No cervical lymphadenopathy was palpable.  She has no symptoms of Raynaud's and no digital ulcerations or signs of gangrene were noted.  She was started on Imuran 50 mg 1 tablet by mouth daily on 04/05/2018.  She has been tolerating Imuran without any side effects.  She has not noticed any improvement in her symptoms so we will increase her dose of Imuran to 100 mg by mouth daily.  She was also given a prescription for pilocarpine 5 mg 3 times daily as needed for sicca symptoms.  She will return for lab work in 2 weeks to monitor for drug toxicity.  She was advised to notify us if she develops new or worsening symptoms.  She will follow up in 3 months.   High risk medication use - Imuran-started on 04/05/18-She will increase to 100 mg po daily., She could not tolerate PLQ due to vision loss.  She had CBC and CMP drawn on 04/21/2018.  She will return for lab work in 2 weeks after increasing her dose of Imuran to 100 mg by mouth daily.- Plan: CBC with Differential/Platelet, CMP14+EGFR  Primary osteoarthritis of both hands: She has mild osteoarthritic changes in bilateral hands but no synovitis was noted.  She has complete laceration bilaterally.  Joint protection and muscle strengthening were discussed.  Primary osteoarthritis of both knees: No warmth or effusion.  Good range of motion.  She is chronic pain in bilateral knee joints.  Chondromalacia of both patellae  Fibromyalgia: She continues to have generalized muscle aches and muscle tenderness due to fibromyalgia.  She has chronic fatigue and insomnia.  She takes trazodone 100 mg by mouth at bedtime PRN.  She is on Lyrica 100 mg by mouth daily.  She takes Flexeril 10 mg as needed for muscle spasms.  DDD (degenerative disc disease), lumbar: No midline spinal tenderness.  Good range of  motion.  Primary insomnia: She takes trazodone 100 mg by mouth at bedtime PRN for insomnia.  Other fatigue: She has chronic fatigue.  Other medical conditions are  listed as follows:  History of anemia  Vitamin D deficiency  History of gastric bypass  History of depression  History of anxiety  History of hypertension  History of cholecystectomy  Former smoker   Orders: Orders Placed This Encounter  Procedures  . CBC with Differential/Platelet  . CMP14+EGFR   No orders of the defined types were placed in this encounter.    Follow-Up Instructions: No follow-ups on file.   Ofilia Neas, PA-C  Note - This record has been created using Dragon software.  Chart creation errors have been sought, but may not always  have been located. Such creation errors do not reflect on  the standard of medical care.

## 2018-04-19 NOTE — Telephone Encounter (Signed)
Patient requested her labwork orders be sent to [facility] in [place].  Patient states she will be going this afternoon.

## 2018-04-22 LAB — CMP14+EGFR
ALBUMIN: 4 g/dL (ref 3.5–5.5)
ALK PHOS: 90 IU/L (ref 39–117)
ALT: 11 IU/L (ref 0–32)
AST: 18 IU/L (ref 0–40)
Albumin/Globulin Ratio: 1.3 (ref 1.2–2.2)
BUN / CREAT RATIO: 15 (ref 9–23)
BUN: 11 mg/dL (ref 6–24)
Bilirubin Total: 0.2 mg/dL (ref 0.0–1.2)
CO2: 25 mmol/L (ref 20–29)
Calcium: 9.7 mg/dL (ref 8.7–10.2)
Chloride: 103 mmol/L (ref 96–106)
Creatinine, Ser: 0.72 mg/dL (ref 0.57–1.00)
GFR calc Af Amer: 111 mL/min/{1.73_m2} (ref >59)
GFR calc non Af Amer: 96 mL/min/{1.73_m2} (ref >59)
Globulin, Total: 3 g/dL (ref 1.5–4.5)
Glucose: 107 mg/dL — ABNORMAL HIGH (ref 65–99)
Potassium: 3.3 mmol/L — ABNORMAL LOW (ref 3.5–5.2)
SODIUM: 138 mmol/L (ref 134–144)
Total Protein: 7 g/dL (ref 6.0–8.5)

## 2018-04-22 LAB — CBC WITH DIFFERENTIAL/PLATELET
Basophils Absolute: 0 10*3/uL (ref 0.0–0.2)
Basos: 0 %
EOS (ABSOLUTE): 0 10*3/uL (ref 0.0–0.4)
Eos: 0 %
HEMATOCRIT: 33.2 % — AB (ref 34.0–46.6)
Hemoglobin: 11 g/dL — ABNORMAL LOW (ref 11.1–15.9)
LYMPHS: 24 %
Lymphocytes Absolute: 2.6 10*3/uL (ref 0.7–3.1)
MCH: 23.7 pg — ABNORMAL LOW (ref 26.6–33.0)
MCHC: 33.1 g/dL (ref 31.5–35.7)
MCV: 72 fL — AB (ref 79–97)
MONOCYTES: 10 %
Monocytes Absolute: 1.1 10*3/uL — ABNORMAL HIGH (ref 0.1–0.9)
Neutrophils Absolute: 7.2 10*3/uL — ABNORMAL HIGH (ref 1.4–7.0)
Neutrophils: 66 %
PLATELETS: 380 10*3/uL (ref 150–450)
RBC: 4.64 x10E6/uL (ref 3.77–5.28)
RDW: 16.4 % — AB (ref 12.3–15.4)
WBC: 10.9 10*3/uL — AB (ref 3.4–10.8)

## 2018-04-22 NOTE — Telephone Encounter (Signed)
Potassium is low.  Please notify patient and fax results to her PCP.  Rest of the labs are stable.

## 2018-04-29 ENCOUNTER — Ambulatory Visit (INDEPENDENT_AMBULATORY_CARE_PROVIDER_SITE_OTHER): Payer: BLUE CROSS/BLUE SHIELD | Admitting: Physician Assistant

## 2018-04-29 ENCOUNTER — Encounter: Payer: Self-pay | Admitting: Physician Assistant

## 2018-04-29 VITALS — BP 129/93 | HR 79 | Resp 15 | Ht 66.0 in | Wt 253.0 lb

## 2018-04-29 DIAGNOSIS — Z87891 Personal history of nicotine dependence: Secondary | ICD-10-CM

## 2018-04-29 DIAGNOSIS — Z8679 Personal history of other diseases of the circulatory system: Secondary | ICD-10-CM

## 2018-04-29 DIAGNOSIS — M19041 Primary osteoarthritis, right hand: Secondary | ICD-10-CM

## 2018-04-29 DIAGNOSIS — M351 Other overlap syndromes: Secondary | ICD-10-CM | POA: Diagnosis not present

## 2018-04-29 DIAGNOSIS — M797 Fibromyalgia: Secondary | ICD-10-CM

## 2018-04-29 DIAGNOSIS — M170 Bilateral primary osteoarthritis of knee: Secondary | ICD-10-CM

## 2018-04-29 DIAGNOSIS — M2242 Chondromalacia patellae, left knee: Secondary | ICD-10-CM

## 2018-04-29 DIAGNOSIS — Z8659 Personal history of other mental and behavioral disorders: Secondary | ICD-10-CM

## 2018-04-29 DIAGNOSIS — M51369 Other intervertebral disc degeneration, lumbar region without mention of lumbar back pain or lower extremity pain: Secondary | ICD-10-CM

## 2018-04-29 DIAGNOSIS — M2241 Chondromalacia patellae, right knee: Secondary | ICD-10-CM

## 2018-04-29 DIAGNOSIS — R5383 Other fatigue: Secondary | ICD-10-CM

## 2018-04-29 DIAGNOSIS — Z79899 Other long term (current) drug therapy: Secondary | ICD-10-CM

## 2018-04-29 DIAGNOSIS — M5136 Other intervertebral disc degeneration, lumbar region: Secondary | ICD-10-CM

## 2018-04-29 DIAGNOSIS — E559 Vitamin D deficiency, unspecified: Secondary | ICD-10-CM

## 2018-04-29 DIAGNOSIS — F5101 Primary insomnia: Secondary | ICD-10-CM

## 2018-04-29 DIAGNOSIS — Z862 Personal history of diseases of the blood and blood-forming organs and certain disorders involving the immune mechanism: Secondary | ICD-10-CM

## 2018-04-29 DIAGNOSIS — Z9049 Acquired absence of other specified parts of digestive tract: Secondary | ICD-10-CM

## 2018-04-29 DIAGNOSIS — Z9884 Bariatric surgery status: Secondary | ICD-10-CM

## 2018-04-29 DIAGNOSIS — M19042 Primary osteoarthritis, left hand: Secondary | ICD-10-CM

## 2018-04-29 MED ORDER — PILOCARPINE HCL 5 MG PO TABS: 5.0000 mg | ORAL_TABLET | Freq: Three times a day (TID) | ORAL | 2 refills | Status: DC | PRN

## 2018-04-29 NOTE — Addendum Note (Signed)
Addended by: Verlin FesterYOPP, AMBER C on: 04/29/2018 04:03 PM   Modules accepted: Orders

## 2018-04-29 NOTE — Patient Instructions (Signed)
Standing Labs We placed an order today for your standing lab work.    Please come back and get your standing labs in 2 weeks then 1 month then every 3 months   We have open lab Monday through Friday from 8:30-11:30 AM and 1:30-4:00 PM  at the office of Dr. Pollyann SavoyShaili Deveshwar.   You may experience shorter wait times on Monday and Friday afternoons. The office is located at [address], [address], [place], [place] [address] No appointment is necessary.   Labs are drawn by [facility].  You may receive a bill from [facility] for your lab work. If you have any questions regarding directions or hours of operation,  please call [phone].   Just as a reminder please drink plenty of water prior to coming for your lab work. Thanks!

## 2018-05-07 ENCOUNTER — Ambulatory Visit (HOSPITAL_COMMUNITY)
Admission: RE | Admit: 2018-05-07 | Discharge: 2018-05-07 | Disposition: A | Discharge status: Home / Self Care | Payer: BLUE CROSS/BLUE SHIELD | Source: Ambulatory Visit | Attending: Internal Medicine | Admitting: Internal Medicine

## 2018-05-07 ENCOUNTER — Encounter (HOSPITAL_COMMUNITY): Payer: Self-pay | Admitting: Internal Medicine

## 2018-05-07 ENCOUNTER — Ambulatory Visit (HOSPITAL_BASED_OUTPATIENT_CLINIC_OR_DEPARTMENT_OTHER)
Admission: RE | Admit: 2018-05-07 | Discharge: 2018-05-07 | Disposition: A | Discharge status: Home / Self Care | Payer: BLUE CROSS/BLUE SHIELD | Source: Ambulatory Visit | Attending: Internal Medicine | Admitting: Internal Medicine

## 2018-05-07 VITALS — BP 132/84 | HR 91 | Wt 253.8 lb

## 2018-05-07 DIAGNOSIS — I10 Essential (primary) hypertension: Secondary | ICD-10-CM | POA: Diagnosis not present

## 2018-05-07 DIAGNOSIS — I071 Rheumatic tricuspid insufficiency: Secondary | ICD-10-CM | POA: Diagnosis not present

## 2018-05-07 DIAGNOSIS — M351 Other overlap syndromes: Secondary | ICD-10-CM

## 2018-05-07 LAB — PULMONARY FUNCTION TEST
DL/VA % pred: 89 %
DL/VA: 4.51 ml/min/mmHg/L
DLCO unc % pred: 65 %
DLCO unc: 17.78 ml/min/mmHg
FEF 25-75 Pre: 3.27 L/sec
FEF2575-%PRED-PRE: 131 %
FEV1-%PRED-PRE: 104 %
FEV1-Pre: 2.54 L
FEV1FVC-%Pred-Pre: 110 %
FEV6-%Pred-Pre: 95 %
FEV6-Pre: 2.84 L
FEV6FVC-%Pred-Pre: 102 %
FVC-%Pred-Pre: 92 %
FVC-Pre: 2.84 L
Pre FEV1/FVC ratio: 90 %
Pre FEV6/FVC Ratio: 100 %
RV % pred: 74 %
RV: 1.45 L
TLC % pred: 75 %
TLC: 4.05 L

## 2018-05-07 NOTE — Progress Notes (Signed)
ADVANCED HF CLINIC NOTE  Referring Physician: Dr. Berenice BoutonS. Devashwar Primary Cardiologist: None  HPI: [name] is a 52 y/o woman with morbid obesity, gastric bypass, HTN, lupus and MCTD referred by Dr. Titus Davidson [facility]. Quit smoking in 2000 ( < 1ppd x 30 years).   Today she returns for 1 year follow up. Recently started on imuran and has had more fatigue since that time. SOB with steps but otherwise does well. Denies PND/Orthopnea. No edema or palpitations. Denies presyncope/syncope. Taking all medications.   ECHO 05/07/2018 (today) EF 65-70% RV normal. Grade IDD Mild TR. No PAH. Personally reviewed   Echo 2018  EF 65-70% RV normal. Normal diastolic parameters. Mild TR no significant PAH  PFTs 05/07/2018  FEV1 2.54 DLCO  65%   PFTs 01/22/17 FEV1 2.6L (105%) FVC 2.94 (95%) DLCO 58%     Past Medical History:  Diagnosis Date  . Anemia   . Anxiety   . Arthritis   . Back pain    "OFF & ON"  . 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 DDD (degenerative disc disease), lumbar 03/28/2016  . Difficulty sleeping    occasional - takes med prn  . Fibromyalgia   . Herniated lumbar intervertebral disc   . Hypertension   . Lupus ([facility])    "presents as joint pain, occ. rash  . Lupus arthritis ([facility])   . MCTD (mixed connective tissue disease) ([facility]) 03/28/2016   + ANA, + Ro, + RNP  . Osteoarthritis of both hands 03/28/2016  . Osteoarthritis of both knees 03/28/2016   Moderate     Current Outpatient Medications  Medication Sig Dispense Refill  . acetaminophen (TYLENOL) 500 MG tablet Take 1,000 mg by mouth every 6 (six) hours as needed for mild pain or moderate pain.    Marland Kitchen. albuterol (PROAIR HFA) 108 (90 Base) MCG/ACT inhaler 2 (two) times daily.     Marland Kitchen. azaTHIOprine (IMURAN) 50 MG tablet Take 1 tablet (50mg ) daily for two weeks then increase to 2 tablets (100mg ) daily. (Patient taking differently: Take 100 mg by mouth daily. Take 1 tablet  (50mg ) daily for two weeks then increase to 2 tablets (100mg ) daily.) 60 tablet 2  . budesonide-formoterol (SYMBICORT) 160-4.5 MCG/ACT inhaler     . Calcium Carbonate-Vit D-Min (CALTRATE 600+D PLUS PO) Take 1 tablet by mouth every morning.     . Cyanocobalamin (VITAMIN B-12 IJ) Inject as directed every 30 (thirty) days.    . cyclobenzaprine (FLEXERIL) 10 MG tablet Take 10 mg by mouth 3 (three) times daily as needed for muscle spasms.    Marland Kitchen. escitalopram (LEXAPRO) 20 MG tablet Take 20 mg by mouth as needed.     . furosemide (LASIX) 20 MG tablet TK 1 T PO BID PRF SWELLING  0  . hydrochlorothiazide (HYDRODIURIL) 25 MG tablet Take 25 mg by mouth daily.    Marland Kitchen. lamoTRIgine (LAMICTAL) 25 MG tablet Take 25 mg by mouth 2 (two) times daily as needed.   2  . lidocaine (XYLOCAINE) 5 % ointment Apply 1 application topically as needed.    . Multiple Vitamin (MULTIVITAMIN WITH MINERALS) TABS tablet Take 1 tablet by mouth every morning.     . nitroGLYCERIN (NITROSTAT) 0.4 MG SL tablet     . OxyCODONE HCl 15 MG TABA Take 15 mg by mouth 4 (four) times daily as needed.     . pilocarpine (SALAGEN) 5 MG tablet Take 1 tablet (5 mg total)  by mouth 3 (three) times daily as needed. 90 tablet 2  . polyvinyl alcohol (LIQUIFILM TEARS) 1.4 % ophthalmic solution Place 2 drops into both eyes daily as needed for dry eyes.    . potassium chloride (K-DUR,KLOR-CON) 10 MEQ tablet 20 mEq.   0  . pregabalin (LYRICA) 100 MG capsule Take 100 mg by mouth 3 (three) times daily.    . traZODone (DESYREL) 100 MG tablet Take 100 mg by mouth at bedtime.     No current facility-administered medications for this encounter.     No Known Allergies    Social History   Socioeconomic History  . Marital status: Married    Spouse name: Not on file  . Number of children: Not on file  . Years of education: Not on file  . Highest education level: Not on file  Occupational History  . Not on file  Social Needs  . Financial resource strain: Not  on file  . Food insecurity:    Worry: Not on file    Inability: Not on file  . Transportation needs:    Medical: Not on file    Non-medical: Not on file  Tobacco Use  . Smoking status: Former Smoker    Last attempt to quit: 09/10/1998    Years since quitting: 19.6  . Smokeless tobacco: Never Used  Substance and Sexual Activity  . Alcohol use: No  . Drug use: No  . Sexual activity: Not on file  Lifestyle  . Physical activity:    Days per week: Not on file    Minutes per session: Not on file  . Stress: Not on file  Relationships  . Social connections:    Talks on phone: Not on file    Gets together: Not on file    Attends religious service: Not on file    Active member of club or organization: Not on file    Attends meetings of clubs or organizations: Not on file    Relationship status: Not on file  . Intimate partner violence:    Fear of current or ex partner: Not on file    Emotionally abused: Not on file    Physically abused: Not on file    Forced sexual activity: Not on file  Other Topics Concern  . Not on file  Social History Narrative  . Not on file      Family History  Problem Relation Age of Onset  . Pulmonary embolism Mother   . Cancer Father        colon  . COPD Other   . Hypertension Other   . Hyperlipidemia Other   . Stroke Other   . Diabetes Other   . Cancer Other   . Heart disease Other   . Rheum arthritis Sister   . Autoimmune disease Daughter   . Graves' disease Son     Vitals:   05/07/18 1216  BP: 132/84  Pulse: 91  SpO2: 99%  Weight: 115.1 kg (253 lb 12.8 oz)   Wt Readings from Last 3 Encounters:  05/07/18 115.1 kg (253 lb 12.8 oz)  04/29/18 114.8 kg (253 lb)  04/05/18 115.5 kg (254 lb 9.6 oz)     PHYSICAL EXAM: General:  Well appearing. No resp difficulty HEENT: normal Neck: supple. no JVD. Carotids 2+ bilat; no bruits. No lymphadenopathy or thryomegaly appreciated. Cor: PMI nondisplaced. Regular rate & rhythm. No rubs,  gallops or murmurs. Lungs: clear Abdomen: obese, soft, nontender, nondistended. No hepatosplenomegaly. No bruits or  masses. Good bowel sounds. Extremities: no cyanosis, clubbing, rash, edema Neuro: alert & orientedx3, cranial nerves grossly intact. moves all 4 extremities w/o difficulty. Affect pleasant  ASSESSMENT & PLAN: 1. Lupus/mixed connective tissue disease - Dicussed incidence of PAH in patients with CTD in detail ECHO reviewed and discussed by Dr Gala RomneyBensimhon today. No evidence of PAH.   2. Abnormal ECG - anterior q-waves suggestive of possible previous anterior MI but echo completely normal. Suspect possibly due to poor precordial lead placement.   Follow up in 1 year with ECHO and PFTs.   Tonye BecketAmy Clegg, NP  12:49 PM   Patient seen and examined with the above-signed Advanced Practice Provider and/or Housestaff. I personally reviewed laboratory data, imaging studies and relevant notes. I independently examined the patient and formulated the important aspects of the plan. I have edited the note to reflect any of my changes or salient points. I have personally discussed the plan with the patient and/or family.  She is doing well. Echo and PFTs reviewed with her personally. No evidence of PAH. Will screen again next year per guidelines. If screening remains normal. Can consider moving to q2 year screens.   Arvilla Meresaniel Loretta Doutt, MD  3:34 PM

## 2018-05-07 NOTE — Patient Instructions (Signed)
Follow up with Dr.Bensimhon in 1 year  

## 2018-05-07 NOTE — Progress Notes (Signed)
  Echocardiogram 2D Echocardiogram has been performed.  Holly SavoyCasey N Shyniece Scripter 05/07/2018, 11:53 AM

## 2018-05-17 ENCOUNTER — Other Ambulatory Visit: Payer: Self-pay | Admitting: *Deleted

## 2018-05-17 DIAGNOSIS — Z79899 Other long term (current) drug therapy: Secondary | ICD-10-CM

## 2018-05-17 LAB — CBC WITH DIFFERENTIAL/PLATELET
ABSOLUTE MONOCYTES: 907 {cells}/uL (ref 200–950)
Basophils Absolute: 41 cells/uL (ref 0–200)
Basophils Relative: 0.5 %
Eosinophils Absolute: 284 cells/uL (ref 15–500)
Eosinophils Relative: 3.5 %
HCT: 35.4 % (ref 35.0–45.0)
Hemoglobin: 11.5 g/dL — ABNORMAL LOW (ref 11.7–15.5)
Lymphs Abs: 2503 cells/uL (ref 850–3900)
MCH: 23.9 pg — ABNORMAL LOW (ref 27.0–33.0)
MCHC: 32.5 g/dL (ref 32.0–36.0)
MCV: 73.4 fL — AB (ref 80.0–100.0)
MPV: 9.3 fL (ref 7.5–12.5)
Monocytes Relative: 11.2 %
Neutro Abs: 4366 cells/uL (ref 1500–7800)
Neutrophils Relative %: 53.9 %
Platelets: 447 10*3/uL — ABNORMAL HIGH (ref 140–400)
RBC: 4.82 10*6/uL (ref 3.80–5.10)
RDW: 15.7 % — ABNORMAL HIGH (ref 11.0–15.0)
Total Lymphocyte: 30.9 %
WBC: 8.1 10*3/uL (ref 3.8–10.8)

## 2018-05-17 LAB — COMPLETE METABOLIC PANEL WITH GFR
AG Ratio: 1.2 (calc) (ref 1.0–2.5)
ALT: 9 U/L (ref 6–29)
AST: 16 U/L (ref 10–35)
Albumin: 3.9 g/dL (ref 3.6–5.1)
Alkaline phosphatase (APISO): 82 U/L (ref 33–130)
BUN: 13 mg/dL (ref 7–25)
CO2: 30 mmol/L (ref 20–32)
Calcium: 9.6 mg/dL (ref 8.6–10.4)
Chloride: 102 mmol/L (ref 98–110)
Creat: 1.01 mg/dL (ref 0.50–1.05)
GFR, Est African American: 74 mL/min/{1.73_m2} (ref >=60)
GFR, Est Non African American: 64 mL/min/{1.73_m2} (ref >=60)
Globulin: 3.3 g/dL (calc) (ref 1.9–3.7)
Glucose, Bld: 48 mg/dL — ABNORMAL LOW (ref 65–99)
Potassium: 3.4 mmol/L — ABNORMAL LOW (ref 3.5–5.3)
Sodium: 141 mmol/L (ref 135–146)
Total Bilirubin: 0.3 mg/dL (ref 0.2–1.2)
Total Protein: 7.2 g/dL (ref 6.1–8.1)

## 2018-06-09 ENCOUNTER — Telehealth: Payer: Self-pay | Admitting: Rheumatology

## 2018-06-09 DIAGNOSIS — Z79899 Other long term (current) drug therapy: Secondary | ICD-10-CM

## 2018-06-09 NOTE — Telephone Encounter (Signed)
Lab orders released.  

## 2018-06-09 NOTE — Telephone Encounter (Signed)
Patient going to [facility] in [place] tomorrow for labs. Please release orders.

## 2018-06-09 NOTE — Addendum Note (Signed)
Addended by: Henriette Combs on: 06/09/2018 03:51 PM   Modules accepted: Orders

## 2018-07-16 NOTE — Progress Notes (Deleted)
Office Visit Note  Patient: Holly Davidson             Date of Birth: 1965/08/10           MRN: [id number]             PCP: Mikael Spray, NP Referring: Mikael Spray, NP Visit Date: 07/29/2018 Occupation: @GUAROCC @  Subjective:  No chief complaint on file.   History of Present Illness: Holly Davidson is a 53 y.o. female ***   Activities of Daily Living:  Patient reports morning stiffness for *** {minute/hour:19697}.   Patient {ACTIONS;DENIES/REPORTS:21021675::"Denies"} nocturnal pain.  Difficulty dressing/grooming: {ACTIONS;DENIES/REPORTS:21021675::"Denies"} Difficulty climbing stairs: {ACTIONS;DENIES/REPORTS:21021675::"Denies"} Difficulty getting out of chair: {ACTIONS;DENIES/REPORTS:21021675::"Denies"} Difficulty using hands for taps, buttons, cutlery, and/or writing: {ACTIONS;DENIES/REPORTS:21021675::"Denies"}  No Rheumatology ROS completed.   PMFS History:  Patient Active Problem List   Diagnosis Date Noted  . Other fatigue 12/18/2016  . Primary insomnia 12/18/2016  . Vitamin D deficiency 12/18/2016  . Former smoker 12/18/2016  . History of cholecystectomy 12/18/2016  . History of gastric bypass 12/18/2016  . Osteoarthritis of both hands 03/28/2016  . 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 MCTD (mixed connective tissue disease) (HCC) + ANA, + Ro, + RNP 03/28/2016  . Osteoarthritis of both knees 03/28/2016  . DDD (degenerative disc disease), lumbar 03/28/2016  . Fibromyalgia 03/28/2016  . Morbid obesity, Initial weight - 282, BMI - 42.7 09/09/2012    Past Medical History:  Diagnosis Date  . Anemia   . Anxiety   . Arthritis   . Back pain    "OFF & ON"  . 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 DDD (degenerative disc disease), lumbar 03/28/2016  . Difficulty sleeping    occasional - takes med prn  . Fibromyalgia   . Herniated lumbar intervertebral disc   . Hypertension   . Lupus (HCC)    "presents as joint pain, occ. rash  . Lupus  arthritis (HCC)   . MCTD (mixed connective tissue disease) (HCC) 03/28/2016   + ANA, + Ro, + RNP  . Osteoarthritis of both hands 03/28/2016  . Osteoarthritis of both knees 03/28/2016   Moderate     Family History  Problem Relation Age of Onset  . Pulmonary embolism Mother   . Cancer Father        colon  . COPD Other   . Hypertension Other   . Hyperlipidemia Other   . Stroke Other   . Diabetes Other   . Cancer Other   . Heart disease Other   . Rheum arthritis Sister   . Autoimmune disease Daughter   . Graves' disease Son    Past Surgical History:  Procedure Laterality Date  . ABDOMINAL HYSTERECTOMY    . BREATH TEK H PYLORI N/A 09/14/2013   Procedure: BREATH TEK H PYLORI;  Surgeon: Kandis Cocking, MD;  Location: [facility];  Service: General;  Laterality: N/A;  . CESAREAN SECTION    . GASTRIC ROUX-EN-Y N/A 03/14/2014   Procedure: LAPAROSCOPIC ROUX-EN-Y GASTRIC BYPASS WITH UPPER ENDOSCOPY;  Surgeon: Ovidio Kin, MD;  Location: [facility];  Service: General;  Laterality: N/A;  . right foot surgery    . TUBAL LIGATION     Social History   Social History Narrative  . Not on file    There is no immunization history on file for this patient.   Objective: Vital Signs: There were no vitals taken for this visit.   Physical Exam   Musculoskeletal Exam: ***  CDAI Exam: CDAI Score: Not documented Patient Global Assessment: Not documented; Provider Global Assessment: Not documented Swollen: Not documented; Tender: Not documented Joint Exam   Not documented   There is currently no information documented on the homunculus. [name] and complete the homunculus joint exam.  Investigation: No additional findings.  Imaging: No results found.  Recent Labs: Lab Results  Component Value Date   WBC 8.1 05/17/2018   HGB 11.5 (L) 05/17/2018   PLT 447 (H) 05/17/2018   NA 141 05/17/2018   K 3.4 (L) 05/17/2018   CL 102 05/17/2018   CO2 30 05/17/2018    GLUCOSE 48 (L) 05/17/2018   BUN 13 05/17/2018   CREATININE 1.01 05/17/2018   BILITOT 0.3 05/17/2018   ALKPHOS 90 04/21/2018   AST 16 05/17/2018   ALT 9 05/17/2018   PROT 7.2 05/17/2018   ALBUMIN 4.0 04/21/2018   CALCIUM 9.6 05/17/2018   GFRAA 74 05/17/2018    Speciality Comments: No specialty comments available.  Procedures:  No procedures performed Allergies: Patient has no known allergies.   Assessment / Plan:     Visit Diagnoses: No diagnosis found.   Orders: No orders of the defined types were placed in this encounter.  No orders of the defined types were placed in this encounter.   Face-to-face time spent with patient was *** minutes. Greater than 50% of time was spent in counseling and coordination of care.  Follow-Up Instructions: No follow-ups on file.   Gearldine Bienenstock, PA-C  Note - This record has been created using Dragon software.  Chart creation errors have been sought, but may not always  have been located. Such creation errors do not reflect on  the standard of medical care.

## 2018-07-19 ENCOUNTER — Other Ambulatory Visit: Payer: Self-pay | Admitting: Physician Assistant

## 2018-07-20 ENCOUNTER — Other Ambulatory Visit: Payer: Self-pay

## 2018-07-20 DIAGNOSIS — Z79899 Other long term (current) drug therapy: Secondary | ICD-10-CM

## 2018-07-20 NOTE — Telephone Encounter (Signed)
Last Visit: 04/29/18 Next Visit: 07/29/18 Labs: 05/17/18 Glucose is low at 48. Potassium is borderline low but improving. Anemia is improving  Okay to refill per Dr. Corliss Skains

## 2018-07-21 LAB — COMPLETE METABOLIC PANEL WITH GFR
AG Ratio: 1.3 (calc) (ref 1.0–2.5)
ALT: 9 U/L (ref 6–29)
AST: 16 U/L (ref 10–35)
Albumin: 4 g/dL (ref 3.6–5.1)
Alkaline phosphatase (APISO): 84 U/L (ref 37–153)
BUN: 11 mg/dL (ref 7–25)
CO2: 31 mmol/L (ref 20–32)
CREATININE: 0.81 mg/dL (ref 0.50–1.05)
Calcium: 9.3 mg/dL (ref 8.6–10.4)
Chloride: 100 mmol/L (ref 98–110)
GFR, Est African American: 96 mL/min/{1.73_m2} (ref >=60)
GFR, Est Non African American: 83 mL/min/{1.73_m2} (ref >=60)
Globulin: 3.2 g/dL (calc) (ref 1.9–3.7)
Glucose, Bld: 65 mg/dL (ref 65–99)
Potassium: 3.4 mmol/L — ABNORMAL LOW (ref 3.5–5.3)
Sodium: 141 mmol/L (ref 135–146)
Total Bilirubin: 0.3 mg/dL (ref 0.2–1.2)
Total Protein: 7.2 g/dL (ref 6.1–8.1)

## 2018-07-21 LAB — CBC WITH DIFFERENTIAL/PLATELET
Absolute Monocytes: 695 cells/uL (ref 200–950)
Basophils Absolute: 40 cells/uL (ref 0–200)
Basophils Relative: 0.7 %
Eosinophils Absolute: 257 cells/uL (ref 15–500)
Eosinophils Relative: 4.5 %
HCT: 36.9 % (ref 35.0–45.0)
Hemoglobin: 11.4 g/dL — ABNORMAL LOW (ref 11.7–15.5)
Lymphs Abs: 1961 cells/uL (ref 850–3900)
MCH: 23.3 pg — AB (ref 27.0–33.0)
MCHC: 30.9 g/dL — ABNORMAL LOW (ref 32.0–36.0)
MCV: 75.3 fL — ABNORMAL LOW (ref 80.0–100.0)
MPV: 9.2 fL (ref 7.5–12.5)
Monocytes Relative: 12.2 %
Neutro Abs: 2747 cells/uL (ref 1500–7800)
Neutrophils Relative %: 48.2 %
Platelets: 445 10*3/uL — ABNORMAL HIGH (ref 140–400)
RBC: 4.9 10*6/uL (ref 3.80–5.10)
RDW: 17 % — ABNORMAL HIGH (ref 11.0–15.0)
Total Lymphocyte: 34.4 %
WBC: 5.7 10*3/uL (ref 3.8–10.8)

## 2018-07-21 NOTE — Progress Notes (Signed)
Potassium is low.  Please notify patient and forward labs to her PCP.

## 2018-07-27 ENCOUNTER — Ambulatory Visit: Payer: Medicaid Other | Admitting: Rheumatology

## 2018-07-27 NOTE — Progress Notes (Deleted)
Office Visit Note  Patient: Holly Davidson             Date of Birth: 05/27/1965           MRN: [id number]             PCP: Mikael Spray, NP Referring: Mikael Spray, NP Visit Date: 07/27/2018 Occupation: @GUAROCC @  Subjective:  No chief complaint on file.   History of Present Illness: Holly Davidson is a 53 y.o. female ***   Activities of Daily Living:  Patient reports morning stiffness for *** {minute/hour:19697}.   Patient {ACTIONS;DENIES/REPORTS:21021675::"Denies"} nocturnal pain.  Difficulty dressing/grooming: {ACTIONS;DENIES/REPORTS:21021675::"Denies"} Difficulty climbing stairs: {ACTIONS;DENIES/REPORTS:21021675::"Denies"} Difficulty getting out of chair: {ACTIONS;DENIES/REPORTS:21021675::"Denies"} Difficulty using hands for taps, buttons, cutlery, and/or writing: {ACTIONS;DENIES/REPORTS:21021675::"Denies"}  No Rheumatology ROS completed.   PMFS History:  Patient Active Problem List   Diagnosis Date Noted  . Other fatigue 12/18/2016  . Primary insomnia 12/18/2016  . Vitamin D deficiency 12/18/2016  . Former smoker 12/18/2016  . History of cholecystectomy 12/18/2016  . History of gastric bypass 12/18/2016  . Osteoarthritis of both hands 03/28/2016  . 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 MCTD (mixed connective tissue disease) (HCC) + ANA, + Ro, + RNP 03/28/2016  . Osteoarthritis of both knees 03/28/2016  . DDD (degenerative disc disease), lumbar 03/28/2016  . Fibromyalgia 03/28/2016  . Morbid obesity, Initial weight - 282, BMI - 42.7 09/09/2012    Past Medical History:  Diagnosis Date  . Anemia   . Anxiety   . Arthritis   . Back pain    "OFF & ON"  . 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 DDD (degenerative disc disease), lumbar 03/28/2016  . Difficulty sleeping    occasional - takes med prn  . Fibromyalgia   . Herniated lumbar intervertebral disc   . Hypertension   . Lupus (HCC)    "presents as joint pain, occ. rash  . Lupus  arthritis (HCC)   . MCTD (mixed connective tissue disease) (HCC) 03/28/2016   + ANA, + Ro, + RNP  . Osteoarthritis of both hands 03/28/2016  . Osteoarthritis of both knees 03/28/2016   Moderate     Family History  Problem Relation Age of Onset  . Pulmonary embolism Mother   . Cancer Father        colon  . COPD Other   . Hypertension Other   . Hyperlipidemia Other   . Stroke Other   . Diabetes Other   . Cancer Other   . Heart disease Other   . Rheum arthritis Sister   . Autoimmune disease Daughter   . Shonn Farruggia' disease Son    Past Surgical History:  Procedure Laterality Date  . ABDOMINAL HYSTERECTOMY    . BREATH TEK H PYLORI N/A 09/14/2013   Procedure: BREATH TEK H PYLORI;  Surgeon: Kandis Cocking, MD;  Location: [facility];  Service: General;  Laterality: N/A;  . CESAREAN SECTION    . GASTRIC ROUX-EN-Y N/A 03/14/2014   Procedure: LAPAROSCOPIC ROUX-EN-Y GASTRIC BYPASS WITH UPPER ENDOSCOPY;  Surgeon: Ovidio Kin, MD;  Location: [facility];  Service: General;  Laterality: N/A;  . right foot surgery    . TUBAL LIGATION     Social History   Social History Narrative  . Not on file    There is no immunization history on file for this patient.   Objective: Vital Signs: There were no vitals taken for this visit.   Physical Exam   Musculoskeletal Exam: ***  CDAI Exam: CDAI Score: Not documented Patient Global Assessment: Not documented; Provider Global Assessment: Not documented Swollen: Not documented; Tender: Not documented Joint Exam   Not documented   There is currently no information documented on the homunculus. [name] and complete the homunculus joint exam.  Investigation: No additional findings.  Imaging: No results found.  Recent Labs: Lab Results  Component Value Date   WBC 5.7 07/20/2018   HGB 11.4 (L) 07/20/2018   PLT 445 (H) 07/20/2018   NA 141 07/20/2018   K 3.4 (L) 07/20/2018   CL 100 07/20/2018   CO2 31 07/20/2018    GLUCOSE 65 07/20/2018   BUN 11 07/20/2018   CREATININE 0.81 07/20/2018   BILITOT 0.3 07/20/2018   ALKPHOS 90 04/21/2018   AST 16 07/20/2018   ALT 9 07/20/2018   PROT 7.2 07/20/2018   ALBUMIN 4.0 04/21/2018   CALCIUM 9.3 07/20/2018   GFRAA 96 07/20/2018    Speciality Comments: No specialty comments available.  Procedures:  No procedures performed Allergies: Patient has no known allergies.   Assessment / Plan:     Visit Diagnoses: No diagnosis found.   Orders: No orders of the defined types were placed in this encounter.  No orders of the defined types were placed in this encounter.   Face-to-face time spent with patient was *** minutes. Greater than 50% of time was spent in counseling and coordination of care.  Follow-Up Instructions: No follow-ups on file.   Ellen Henri, CMA  Note - This record has been created using Animal nutritionist.  Chart creation errors have been sought, but may not always  have been located. Such creation errors do not reflect on  the standard of medical care.

## 2018-07-28 NOTE — Progress Notes (Deleted)
Office Visit Note  Patient: Holly Davidson             Date of Birth: 08-17-1965           MRN: [id number]             PCP: Mikael Spray, NP Referring: Mikael Spray, NP Visit Date: 08/02/2018 Occupation: @GUAROCC @  Subjective:  No chief complaint on file.   History of Present Illness: Holly Davidson is a 53 y.o. female ***   Activities of Daily Living:  Patient reports morning stiffness for *** {minute/hour:19697}.   Patient {ACTIONS;DENIES/REPORTS:21021675::"Denies"} nocturnal pain.  Difficulty dressing/grooming: {ACTIONS;DENIES/REPORTS:21021675::"Denies"} Difficulty climbing stairs: {ACTIONS;DENIES/REPORTS:21021675::"Denies"} Difficulty getting out of chair: {ACTIONS;DENIES/REPORTS:21021675::"Denies"} Difficulty using hands for taps, buttons, cutlery, and/or writing: {ACTIONS;DENIES/REPORTS:21021675::"Denies"}  No Rheumatology ROS completed.   PMFS History:  Patient Active Problem List   Diagnosis Date Noted  . Other fatigue 12/18/2016  . Primary insomnia 12/18/2016  . Vitamin D deficiency 12/18/2016  . Former smoker 12/18/2016  . History of cholecystectomy 12/18/2016  . History of gastric bypass 12/18/2016  . Osteoarthritis of both hands 03/28/2016  . 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 MCTD (mixed connective tissue disease) (HCC) + ANA, + Ro, + RNP 03/28/2016  . Osteoarthritis of both knees 03/28/2016  . DDD (degenerative disc disease), lumbar 03/28/2016  . Fibromyalgia 03/28/2016  . Morbid obesity, Initial weight - 282, BMI - 42.7 09/09/2012    Past Medical History:  Diagnosis Date  . Anemia   . Anxiety   . Arthritis   . Back pain    "OFF & ON"  . 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 DDD (degenerative disc disease), lumbar 03/28/2016  . Difficulty sleeping    occasional - takes med prn  . Fibromyalgia   . Herniated lumbar intervertebral disc   . Hypertension   . Lupus (HCC)    "presents as joint pain, occ. rash  . Lupus  arthritis (HCC)   . MCTD (mixed connective tissue disease) (HCC) 03/28/2016   + ANA, + Ro, + RNP  . Osteoarthritis of both hands 03/28/2016  . Osteoarthritis of both knees 03/28/2016   Moderate     Family History  Problem Relation Age of Onset  . Pulmonary embolism Mother   . Cancer Father        colon  . COPD Other   . Hypertension Other   . Hyperlipidemia Other   . Stroke Other   . Diabetes Other   . Cancer Other   . Heart disease Other   . Rheum arthritis Sister   . Autoimmune disease Daughter   . Graves' disease Son    Past Surgical History:  Procedure Laterality Date  . ABDOMINAL HYSTERECTOMY    . BREATH TEK H PYLORI N/A 09/14/2013   Procedure: BREATH TEK H PYLORI;  Surgeon: Kandis Cocking, MD;  Location: [facility];  Service: General;  Laterality: N/A;  . CESAREAN SECTION    . GASTRIC ROUX-EN-Y N/A 03/14/2014   Procedure: LAPAROSCOPIC ROUX-EN-Y GASTRIC BYPASS WITH UPPER ENDOSCOPY;  Surgeon: Ovidio Kin, MD;  Location: [facility];  Service: General;  Laterality: N/A;  . right foot surgery    . TUBAL LIGATION     Social History   Social History Narrative  . Not on file    There is no immunization history on file for this patient.   Objective: Vital Signs: There were no vitals taken for this visit.   Physical Exam   Musculoskeletal Exam: ***  CDAI Exam: CDAI Score: Not documented Patient Global Assessment: Not documented; Provider Global Assessment: Not documented Swollen: Not documented; Tender: Not documented Joint Exam   Not documented   There is currently no information documented on the homunculus. [name] and complete the homunculus joint exam.  Investigation: No additional findings.  Imaging: No results found.  Recent Labs: Lab Results  Component Value Date   WBC 5.7 07/20/2018   HGB 11.4 (L) 07/20/2018   PLT 445 (H) 07/20/2018   NA 141 07/20/2018   K 3.4 (L) 07/20/2018   CL 100 07/20/2018   CO2 31 07/20/2018    GLUCOSE 65 07/20/2018   BUN 11 07/20/2018   CREATININE 0.81 07/20/2018   BILITOT 0.3 07/20/2018   ALKPHOS 90 04/21/2018   AST 16 07/20/2018   ALT 9 07/20/2018   PROT 7.2 07/20/2018   ALBUMIN 4.0 04/21/2018   CALCIUM 9.3 07/20/2018   GFRAA 96 07/20/2018    Speciality Comments: No specialty comments available.  Procedures:  No procedures performed Allergies: Patient has no known allergies.   Assessment / Plan:     Visit Diagnoses: No diagnosis found.   Orders: No orders of the defined types were placed in this encounter.  No orders of the defined types were placed in this encounter.   Face-to-face time spent with patient was *** minutes. Greater than 50% of time was spent in counseling and coordination of care.  Follow-Up Instructions: No follow-ups on file.   Gearldine Bienenstock, PA-C  Note - This record has been created using Dragon software.  Chart creation errors have been sought, but may not always  have been located. Such creation errors do not reflect on  the standard of medical care.

## 2018-07-29 ENCOUNTER — Ambulatory Visit: Payer: BLUE CROSS/BLUE SHIELD | Admitting: Physician Assistant

## 2018-08-02 ENCOUNTER — Ambulatory Visit: Payer: Medicaid Other | Admitting: Rheumatology

## 2018-10-01 ENCOUNTER — Other Ambulatory Visit: Payer: Self-pay | Admitting: Physician Assistant

## 2018-10-01 NOTE — Telephone Encounter (Signed)
I tried contacting patient in regards to scheduling a rov with Sherron Ales, PAC. Patient did not answer, and voicemail is not set up.

## 2018-10-01 NOTE — Telephone Encounter (Signed)
Pleases schedule patient for a follow up visit. Patient was due March 2020.

## 2018-10-01 NOTE — Telephone Encounter (Signed)
Last Visit: 04/29/18 Next Visit Due March 2020. Message sent to the front to schedule patient.   Okay to refill per Dr. Corliss Skains

## 2018-10-07 ENCOUNTER — Telehealth (HOSPITAL_COMMUNITY): Payer: Self-pay | Admitting: *Deleted

## 2018-10-07 NOTE — Telephone Encounter (Signed)
Records faxed per pt signed med release form to [facility] [address] MD fax [phone]

## 2018-10-18 NOTE — Progress Notes (Signed)
Office Visit Note  Patient: Holly Davidson             Date of Birth: 26-Jan-1966           MRN: [id number]             PCP: Mikael Spray, NP Referring: Mikael Spray, NP Visit Date: 10/25/2018 Occupation: @  Subjective:  Generalized pain   History of Present Illness: Holly Davidson is a 53 y.o. female with history of mixed connective tissue disease, osteoarthritis, and fibromyalgia.  She is taking Imuran 50 mg 2 tablets daily.  She has not missed any doses recently.  She has not had any recent infections.  She has been having more frequent and severe fibromyalgia flares recently.  She continues have difficulty sleeping at night and takes trazodone as prescribed.  She has chronic fatigue as well.  She has generalized muscle aches muscle tenderness due to fibromyalgia.  She discontinued Lyrica and was switched to gabapentin which she feels has helped minimally.  She continues to have intermittent lower back pain and uses Biofreeze on a regular basis.  She is experiencing left trochanter bursitis at this time.  She experiences intermittent discomfort in bilateral hands and bilateral knee joints.  She denies any joint swelling.  She has tried cortisone injections as well as gel injections for both knee joints which were ineffective in the past.  She has discussed a total knee replacement with Dr. Burlene Arnt but at this time she is not a candidate.  She denies any recent rashes but feels as though she is starting to develop a facial rash.  She denies any sun sensitivity and tries to avoid sunlight.  She denies any sores in her mouth or nose.  She denies any symptoms of Raynaud's. Denies hair loss. She continues to have sicca symptoms and takes pilocarpine 5 mg 3 times daily.   Activities of Daily Living:  Patient reports morning stiffness for 1 hour.   Patient Reports nocturnal pain.  Difficulty dressing/grooming: Denies Difficulty climbing stairs: Reports Difficulty getting out of chair:  Reports Difficulty using hands for taps, buttons, cutlery, and/or writing: Denies  Review of Systems  Constitutional: Positive for fatigue.  HENT: Positive for mouth dryness. Negative for mouth sores and nose dryness.   Eyes: Positive for dryness. Negative for pain and visual disturbance.  Respiratory: Negative for cough, hemoptysis, shortness of breath and difficulty breathing.   Cardiovascular: Negative for chest pain, palpitations, hypertension and swelling in legs/feet.  Gastrointestinal: Positive for constipation. Negative for blood in stool and diarrhea.  Endocrine: Negative for increased urination.  Genitourinary: Negative for painful urination.  Musculoskeletal: Positive for arthralgias, joint pain, joint swelling, myalgias, morning stiffness, muscle tenderness and myalgias. Negative for muscle weakness.  Skin: Negative for color change, pallor, rash, hair loss, nodules/bumps, skin tightness, ulcers and sensitivity to sunlight.  Allergic/Immunologic: Negative for susceptible to infections.  Neurological: Negative for dizziness, numbness, headaches and weakness.  Hematological: Negative for swollen glands.  Psychiatric/Behavioral: Positive for sleep disturbance (Trazodone). Negative for depressed mood. The patient is nervous/anxious.     PMFS History:  Patient Active Problem List   Diagnosis Date Noted  . Other fatigue 12/18/2016  . Primary insomnia 12/18/2016  . Vitamin D deficiency 12/18/2016  . Former smoker 12/18/2016  . History of cholecystectomy 12/18/2016  . History of gastric bypass 12/18/2016  . Osteoarthritis of both hands 03/28/2016  . 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 MCTD (mixed connective tissue disease) (HCC) +  ANA, + Ro, + RNP 03/28/2016  . Osteoarthritis of both knees 03/28/2016  . DDD (degenerative disc disease), lumbar 03/28/2016  . Fibromyalgia 03/28/2016  . Morbid obesity, Initial weight - 282, BMI - 42.7 09/09/2012    Past Medical History:   Diagnosis Date  . Anemia   . Anxiety   . Arthritis   . Back pain    "OFF & ON"  . 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 DDD (degenerative disc disease), lumbar 03/28/2016  . Difficulty sleeping    occasional - takes med prn  . Fibromyalgia   . Herniated lumbar intervertebral disc   . Hypertension   . Lupus (HCC)    "presents as joint pain, occ. rash  . Lupus arthritis (HCC)   . MCTD (mixed connective tissue disease) (HCC) 03/28/2016   + ANA, + Ro, + RNP  . Osteoarthritis of both hands 03/28/2016  . Osteoarthritis of both knees 03/28/2016   Moderate     Family History  Problem Relation Age of Onset  . Pulmonary embolism Mother   . Cancer Father        colon  . COPD Other   . Hypertension Other   . Hyperlipidemia Other   . Stroke Other   . Diabetes Other   . Cancer Other   . Heart disease Other   . Rheum arthritis Sister   . Autoimmune disease Daughter   . Graves' disease Son    Past Surgical History:  Procedure Laterality Date  . ABDOMINAL HYSTERECTOMY    . BREATH TEK H PYLORI N/A 09/14/2013   Procedure: BREATH TEK H PYLORI;  Surgeon: Kandis Cocking, MD;  Location: [facility];  Service: General;  Laterality: N/A;  . CESAREAN SECTION    . GASTRIC ROUX-EN-Y N/A 03/14/2014   Procedure: LAPAROSCOPIC ROUX-EN-Y GASTRIC BYPASS WITH UPPER ENDOSCOPY;  Surgeon: Ovidio Kin, MD;  Location: [facility];  Service: General;  Laterality: N/A;  . right foot surgery    . TUBAL LIGATION     Social History   Social History Narrative  . Not on file   Immunization History  Administered Date(s) Administered  . Pneumococcal Polysaccharide-23 04/08/2018  . Zoster Recombinat (Shingrix) 04/07/2018, 07/28/2018     Objective: Vital Signs: BP 109/75 (BP Location: Right Arm, Patient Position: Sitting, Cuff Size: Large)   Pulse 71   Resp 14   Ht 5\' 6"  (1.676 m)   Wt 241 lb 9.6 oz (109.6 kg)   BMI 39.00 kg/m    Physical Exam Vitals signs and nursing note reviewed.   Constitutional:      Appearance: She is well-developed.  HENT:     Head: Normocephalic and atraumatic.  Eyes:     Conjunctiva/sclera: Conjunctivae normal.  Neck:     Musculoskeletal: Normal range of motion.  Cardiovascular:     Rate and Rhythm: Normal rate and regular rhythm.     Heart sounds: Normal heart sounds.  Pulmonary:     Effort: Pulmonary effort is normal.     Breath sounds: Normal breath sounds.  Abdominal:     General: Bowel sounds are normal.     Palpations: Abdomen is soft.  Lymphadenopathy:     Cervical: No cervical adenopathy.  Skin:    General: Skin is warm and dry.     Capillary Refill: Capillary refill takes less than 2 seconds.  Neurological:     Mental Status: She is alert and oriented to person, place, and time.  Psychiatric:  Behavior: Behavior normal.      Musculoskeletal Exam: Generalized hyperalgesia and positive tender points.  C-spine, thoracic spine, lumbar spine good range of motion.  No midline spinal tenderness.  No SI joint tenderness.  Shoulder joints, elbow joints, wrist joints, MCPs, PIPs, DIPs good range of motion no synovitis.  She has PIP and DIP synovial thickening consistent with osteoarthritis of bilateral hands.  She has complete fist formation bilaterally.  Hip joints, knee joints, ankle joints, MTPs, PIPs, DIPs good range of motion with no synovitis.  No warmth or effusion of bilateral knee joints.  No tenderness or swelling of ankle joints.  She has tenderness over the left trochanteric bursa.  CDAI Exam: CDAI Score: Not documented Patient Global Assessment: Not documented; Provider Global Assessment: Not documented Swollen: Not documented; Tender: Not documented Joint Exam   Not documented   There is currently no information documented on the homunculus. [name] and complete the homunculus joint exam.  Investigation: No additional findings.  Imaging: No results found.  Recent Labs: Lab Results   Component Value Date   WBC 5.7 07/20/2018   HGB 11.4 (L) 07/20/2018   PLT 445 (H) 07/20/2018   NA 141 07/20/2018   K 3.4 (L) 07/20/2018   CL 100 07/20/2018   CO2 31 07/20/2018   GLUCOSE 65 07/20/2018   BUN 11 07/20/2018   CREATININE 0.81 07/20/2018   BILITOT 0.3 07/20/2018   ALKPHOS 90 04/21/2018   AST 16 07/20/2018   ALT 9 07/20/2018   PROT 7.2 07/20/2018   ALBUMIN 4.0 04/21/2018   CALCIUM 9.3 07/20/2018   GFRAA 96 07/20/2018    Speciality Comments: No specialty comments available.  Procedures:  No procedures performed Allergies: Patient has no known allergies.   Assessment / Plan:     Visit Diagnoses: MCTD (mixed connective tissue disease) (HCC) + ANA, + Ro, + RNP: She has not had any recent signs or symptoms of a flare.  She is clinically doing well on Imuran 50 mg 2 tablets by mouth daily.  She has not missed any doses recently.  She has no synovitis on exam today.  She has not had any recent rashes, photosensitivity, or symptoms of Raynaud's.  No digital ulcerations or signs of gangrene were noted.  She has not had any recent hair loss and no signs of alopecia were noted.  She has no oral or nasal ulcerations.  She continues to have sicca symptoms and takes pilocarpine 5 mg 3 times daily.  She has not experienced any palpitations or shortness of breath recently.  She continues to have chronic fatigue related to insomnia and fibromyalgia.  We will check autoimmune lab work today.  She is due to update CBC and CMP as well.  We will continue on Imuran 50 mg 2 tablets by mouth daily.  She does not need any refills at this time.  She was advised to notify us if she develops any new or worsening symptoms.  She will follow-up in the office in 5 months.- Plan: CBC with Differential/Platelet, COMPLETE METABOLIC PANEL WITH GFR, Sedimentation rate, C3 and C4, Anti-DNA antibody, double-stranded, RNP Antibody  High risk medication use - She is on Imuran 50 mg 2 tablets daily.  She could not  tolerate PLQ due to vision loss.  Last CBC/CMP within normal limits except for low potassium on 07/20/2018.  Due for CBC/CMP today and will monitor every 3 months.  Standing orders are in place.  She has received Pneumovax 23  and Shingrix vaccines.   - Plan: CBC with Differential/Platelet, COMPLETE METABOLIC PANEL WITH GFR  Primary osteoarthritis of both hands: She has PIP and DIP synovial thickening consistent with osteoarthritis of bilateral hands.  She has no tenderness or synovitis on exam today.  She has complete fist formation bilaterally.  She experiences intermittent discomfort and stiffness in both hands.  We discussed using Voltaren gel topically PRN for pain relief.  Joint protection and muscle strengthening were discussed.  Primary osteoarthritis of both knees: She has chronic pain in bilateral knee joints.  No warmth or effusion was noted.  She has good range of motion with no discomfort.  She has had cortisone injections as well as gel injections in the past which were ineffective.  She has discussed having total knee replacement with Dr. Yisroel Rammingaldorf but she is not a candidate at this time.  She declined a cortisone injection today.  Chondromalacia of both patellae: She has bilateral knee crepitus.   Fibromyalgia -She has generalized hyperalgesia and positive tender points on exam.  She has been having increased generalized muscle aches muscle tenderness due to fibromyalgia.  She has been having more frequent and severe fibromyalgia flares.  She presents today with left trochanter bursitis.  She has tenderness on exam.  We discussed exercises to perform.  She was given a handout of exercises.  She was also given a prescription for Voltaren gel which she can apply topically.  If her symptoms persist she can return for cortisone injection.  She continues to have chronic insomnia and takes trazodone as prescribed.  He experiences daytime drowsiness and fatigue secondary to insomnia.  She was recently  switched from Lyrica to gabapentin and has noticed minimal improvement in her symptoms.  She takes Flexeril 10 mg as needed for muscle spasms.  We discussed the importance of staying active and exercising on a regular basis.  Good sleep hygiene was discussed.  Trochanteric bursitis, left hip: She presents today with left trochanter bursitis.  She has tenderness on exam.  She was given a handout of exercises to perform.  If her symptoms persist or worsen she will return for cortisone injection.  DDD (degenerative disc disease), lumbar: She experiences intermittent lower back pain.  She experiences muscle spasms at times.  She takes Flexeril 10 mg 1 tablet as needed for muscle spasms.  Other fatigue: Chronic and related to insomnia.  We discussed the importance of staying active and exercising on a regular basis.  Primary insomnia -She takes trazodone 100 mg by mouth at bedtime PRN for insomnia.  Good sleep hygiene was discussed.   Other medical conditions are listed as follows:   History of hypertension  History of cholecystectomy  History of gastric bypass  Vitamin D deficiency  History of depression  History of anxiety  History of anemia  Former smoker   Orders: Orders Placed This Encounter  Procedures  . CBC with Differential/Platelet  . COMPLETE METABOLIC PANEL WITH GFR  . Sedimentation rate  . C3 and C4  . Anti-DNA antibody, double-stranded  . RNP Antibody   Meds ordered this encounter  Medications  . diclofenac sodium (VOLTAREN) 1 % GEL    Sig: Apply 2 to 4 g to affected area up to 4 times daily as needed.    Dispense:  4 Tube    Refill:  2    Face-to-face time spent with patient was 30 minutes. Greater than 50% of time was spent in counseling and coordination of care.  Follow-Up Instructions:  Return in about 5 months (around 03/27/2019) for Mixed connective tissue disease.   Gearldine Bienenstock, PA-C  Note - This record has been created using Dragon software.   Chart creation errors have been sought, but may not always  have been located. Such creation errors do not reflect on  the standard of medical care.

## 2018-10-25 ENCOUNTER — Ambulatory Visit: Payer: Medicaid Other | Admitting: Physician Assistant

## 2018-10-25 ENCOUNTER — Encounter: Payer: Self-pay | Admitting: Physician Assistant

## 2018-10-25 ENCOUNTER — Other Ambulatory Visit: Payer: Self-pay

## 2018-10-25 VITALS — BP 109/75 | HR 71 | Resp 14 | Ht 66.0 in | Wt 241.6 lb

## 2018-10-25 DIAGNOSIS — R5383 Other fatigue: Secondary | ICD-10-CM

## 2018-10-25 DIAGNOSIS — M19041 Primary osteoarthritis, right hand: Secondary | ICD-10-CM

## 2018-10-25 DIAGNOSIS — Z87891 Personal history of nicotine dependence: Secondary | ICD-10-CM

## 2018-10-25 DIAGNOSIS — M19042 Primary osteoarthritis, left hand: Secondary | ICD-10-CM

## 2018-10-25 DIAGNOSIS — Z79899 Other long term (current) drug therapy: Secondary | ICD-10-CM | POA: Diagnosis not present

## 2018-10-25 DIAGNOSIS — M170 Bilateral primary osteoarthritis of knee: Secondary | ICD-10-CM

## 2018-10-25 DIAGNOSIS — F5101 Primary insomnia: Secondary | ICD-10-CM

## 2018-10-25 DIAGNOSIS — Z8679 Personal history of other diseases of the circulatory system: Secondary | ICD-10-CM

## 2018-10-25 DIAGNOSIS — M351 Other overlap syndromes: Secondary | ICD-10-CM | POA: Diagnosis not present

## 2018-10-25 DIAGNOSIS — M5136 Other intervertebral disc degeneration, lumbar region: Secondary | ICD-10-CM

## 2018-10-25 DIAGNOSIS — Z9884 Bariatric surgery status: Secondary | ICD-10-CM

## 2018-10-25 DIAGNOSIS — M797 Fibromyalgia: Secondary | ICD-10-CM

## 2018-10-25 DIAGNOSIS — M2241 Chondromalacia patellae, right knee: Secondary | ICD-10-CM

## 2018-10-25 DIAGNOSIS — E559 Vitamin D deficiency, unspecified: Secondary | ICD-10-CM

## 2018-10-25 DIAGNOSIS — Z9049 Acquired absence of other specified parts of digestive tract: Secondary | ICD-10-CM

## 2018-10-25 DIAGNOSIS — Z8659 Personal history of other mental and behavioral disorders: Secondary | ICD-10-CM

## 2018-10-25 DIAGNOSIS — Z862 Personal history of diseases of the blood and blood-forming organs and certain disorders involving the immune mechanism: Secondary | ICD-10-CM

## 2018-10-25 DIAGNOSIS — M7062 Trochanteric bursitis, left hip: Secondary | ICD-10-CM

## 2018-10-25 DIAGNOSIS — M2242 Chondromalacia patellae, left knee: Secondary | ICD-10-CM

## 2018-10-25 MED ORDER — DICLOFENAC SODIUM 1 % TD GEL: TRANSDERMAL | 2 refills | Status: DC

## 2018-10-25 NOTE — Patient Instructions (Signed)

## 2018-10-26 ENCOUNTER — Telehealth: Payer: Self-pay | Admitting: Rheumatology

## 2018-10-26 LAB — COMPLETE METABOLIC PANEL WITH GFR
AG Ratio: 1.3 (calc) (ref 1.0–2.5)
ALT: 9 U/L (ref 6–29)
AST: 15 U/L (ref 10–35)
Albumin: 4 g/dL (ref 3.6–5.1)
Alkaline phosphatase (APISO): 59 U/L (ref 37–153)
BUN: 11 mg/dL (ref 7–25)
CO2: 32 mmol/L (ref 20–32)
Calcium: 9.7 mg/dL (ref 8.6–10.4)
Chloride: 99 mmol/L (ref 98–110)
Creat: 0.76 mg/dL (ref 0.50–1.05)
GFR, Est African American: 104 mL/min/{1.73_m2} (ref >=60)
GFR, Est Non African American: 90 mL/min/{1.73_m2} (ref >=60)
Globulin: 3.1 g/dL (calc) (ref 1.9–3.7)
Glucose, Bld: 104 mg/dL — ABNORMAL HIGH (ref 65–99)
Potassium: 3.8 mmol/L (ref 3.5–5.3)
Sodium: 139 mmol/L (ref 135–146)
Total Bilirubin: 0.5 mg/dL (ref 0.2–1.2)
Total Protein: 7.1 g/dL (ref 6.1–8.1)

## 2018-10-26 LAB — C3 AND C4
C3 Complement: 146 mg/dL (ref 83–193)
C4 Complement: 39 mg/dL (ref 15–57)

## 2018-10-26 LAB — CBC WITH DIFFERENTIAL/PLATELET
Absolute Monocytes: 556 cells/uL (ref 200–950)
Basophils Absolute: 28 cells/uL (ref 0–200)
Basophils Relative: 0.7 %
Eosinophils Absolute: 108 cells/uL (ref 15–500)
Eosinophils Relative: 2.7 %
HCT: 36.9 % (ref 35.0–45.0)
Hemoglobin: 11.5 g/dL — ABNORMAL LOW (ref 11.7–15.5)
Lymphs Abs: 1636 cells/uL (ref 850–3900)
MCH: 23.9 pg — ABNORMAL LOW (ref 27.0–33.0)
MCHC: 31.2 g/dL — ABNORMAL LOW (ref 32.0–36.0)
MCV: 76.7 fL — ABNORMAL LOW (ref 80.0–100.0)
MPV: 9 fL (ref 7.5–12.5)
Monocytes Relative: 13.9 %
Neutro Abs: 1672 cells/uL (ref 1500–7800)
Neutrophils Relative %: 41.8 %
Platelets: 445 10*3/uL — ABNORMAL HIGH (ref 140–400)
RBC: 4.81 10*6/uL (ref 3.80–5.10)
RDW: 17.3 % — ABNORMAL HIGH (ref 11.0–15.0)
Total Lymphocyte: 40.9 %
WBC: 4 10*3/uL (ref 3.8–10.8)

## 2018-10-26 LAB — ANTI-DNA ANTIBODY, DOUBLE-STRANDED: ds DNA Ab: 1 IU/mL

## 2018-10-26 LAB — SEDIMENTATION RATE: Sed Rate: 29 mm/h (ref 0–30)

## 2018-10-26 LAB — RNP ANTIBODY: Ribonucleic Protein(ENA) Antibody, IgG: 1.7 AI — AB

## 2018-10-26 NOTE — Progress Notes (Signed)
DsDNA is 1.  RNP is positive but stable.

## 2018-10-26 NOTE — Telephone Encounter (Signed)
Patient called stating she was returning a call to the office regarding her labwork results.   

## 2018-10-26 NOTE — Telephone Encounter (Signed)
Advised patient of lab results, see lab note for details. Patient verbalized understanding.

## 2018-10-26 NOTE — Progress Notes (Signed)
Anemia is stable.  Plts elevated but stable.  Glucose is 104. Rest of CMP WNL.  Sed rate and complements WNL.

## 2018-10-31 ENCOUNTER — Other Ambulatory Visit: Payer: Self-pay | Admitting: Rheumatology

## 2018-11-01 NOTE — Telephone Encounter (Signed)
Last Visit: 10/25/2018 Next Visit: message sent to the front desk to schedule.  Labs: 10/25/2018 Anemia is stable. Plts elevated but stable.  Glucose is 104. Rest of CMP WNL. Sed rate and complements WNL.   Okay to refill per Dr. Estanislado Pandy.

## 2018-11-04 ENCOUNTER — Telehealth: Payer: Self-pay | Admitting: Rheumatology

## 2018-11-04 MED ORDER — PREDNISONE 5 MG PO TABS: ORAL_TABLET | ORAL | 0 refills | Status: DC

## 2018-11-04 NOTE — Telephone Encounter (Signed)
Patient states she was seen in the office on 10/25/18. Patient states she was advised that she has bursitis in her left hip. Patient states she is having pain and swelling in both knees. Patient states she has tried the Voltaren Gel and is getting relief. Patient states she is taking her Imuran as prescribed. Patient is requesting a prescription for Prednisone. Please advise.

## 2018-11-04 NOTE — Telephone Encounter (Signed)
Patient left a voicemail stating she has been resting her hip, but it is still painful.  Patient is requesting prescription of Prednisone.

## 2018-11-04 NOTE — Telephone Encounter (Signed)
Okay to give prednisone taper starting at 20 mg p.o. daily and taper by 5 mg every 2 days.

## 2018-11-04 NOTE — Telephone Encounter (Signed)
Patient advised prescription for prednisone sent to the pharmacy. Patient advised to contact the office if symptoms do not improve.

## 2019-01-19 ENCOUNTER — Telehealth: Payer: Self-pay | Admitting: Rheumatology

## 2019-01-19 NOTE — Telephone Encounter (Signed)
Patient advised  Dr. Estanislado Pandy recommended that the patient try xylimelts and lozenges to help with mouth dryness.  She is also prescribed pilocarpine 5 mg TID. Patient verbalized understanding.

## 2019-01-19 NOTE — Telephone Encounter (Signed)
Patient called stating she has been using Biotene for her dry mouth, but it isn't working and her mouth is getting worse.  Patient is asking if there is something else that Dr. Estanislado Pandy would recommend.

## 2019-01-19 NOTE — Telephone Encounter (Signed)
I spoke with Dr. Estanislado Pandy, and she recommended that the patient try xylimelts and lozenges to help with mouth dryness.  She is also prescribed pilocarpine 5 mg TID.

## 2019-01-22 ENCOUNTER — Other Ambulatory Visit: Payer: Self-pay | Admitting: Rheumatology

## 2019-01-25 ENCOUNTER — Other Ambulatory Visit: Payer: Self-pay | Admitting: Rheumatology

## 2019-01-25 NOTE — Telephone Encounter (Addendum)
Last Visit: 10/25/2018 Next Visit: 04/04/19 Labs: 10/21/18 Anemia is stable. Plts elevated but stable.  Glucose is 104. Rest of CMP WNL. Sed rate and complements WNL.   Patient advised she is due to update labs. Patient will come to office this week to update.  Okay to refill 30 days supply per Dr. Estanislado Pandy

## 2019-01-28 ENCOUNTER — Other Ambulatory Visit: Payer: Self-pay

## 2019-01-28 DIAGNOSIS — Z79899 Other long term (current) drug therapy: Secondary | ICD-10-CM

## 2019-01-29 LAB — CBC WITH DIFFERENTIAL/PLATELET
Absolute Monocytes: 602 cells/uL (ref 200–950)
Basophils Absolute: 18 cells/uL (ref 0–200)
Basophils Relative: 0.3 %
Eosinophils Absolute: 100 cells/uL (ref 15–500)
Eosinophils Relative: 1.7 %
HCT: 36.9 % (ref 35.0–45.0)
Hemoglobin: 11.8 g/dL (ref 11.7–15.5)
Lymphs Abs: 1652 cells/uL (ref 850–3900)
MCH: 25 pg — ABNORMAL LOW (ref 27.0–33.0)
MCHC: 32 g/dL (ref 32.0–36.0)
MCV: 78.2 fL — ABNORMAL LOW (ref 80.0–100.0)
MPV: 9.3 fL (ref 7.5–12.5)
Monocytes Relative: 10.2 %
Neutro Abs: 3528 cells/uL (ref 1500–7800)
Neutrophils Relative %: 59.8 %
Platelets: 354 10*3/uL (ref 140–400)
RBC: 4.72 10*6/uL (ref 3.80–5.10)
RDW: 16.3 % — ABNORMAL HIGH (ref 11.0–15.0)
Total Lymphocyte: 28 %
WBC: 5.9 10*3/uL (ref 3.8–10.8)

## 2019-01-29 LAB — COMPLETE METABOLIC PANEL WITH GFR
AG Ratio: 1.4 (calc) (ref 1.0–2.5)
ALT: 11 U/L (ref 6–29)
AST: 16 U/L (ref 10–35)
Albumin: 4 g/dL (ref 3.6–5.1)
Alkaline phosphatase (APISO): 75 U/L (ref 37–153)
BUN: 17 mg/dL (ref 7–25)
CO2: 27 mmol/L (ref 20–32)
Calcium: 9.3 mg/dL (ref 8.6–10.4)
Chloride: 103 mmol/L (ref 98–110)
Creat: 0.96 mg/dL (ref 0.50–1.05)
GFR, Est African American: 78 mL/min/{1.73_m2} (ref >=60)
GFR, Est Non African American: 67 mL/min/{1.73_m2} (ref >=60)
Globulin: 2.9 g/dL (calc) (ref 1.9–3.7)
Glucose, Bld: 101 mg/dL — ABNORMAL HIGH (ref 65–99)
Potassium: 3.5 mmol/L (ref 3.5–5.3)
Sodium: 139 mmol/L (ref 135–146)
Total Bilirubin: 0.4 mg/dL (ref 0.2–1.2)
Total Protein: 6.9 g/dL (ref 6.1–8.1)

## 2019-02-14 ENCOUNTER — Telehealth (HOSPITAL_COMMUNITY): Payer: Self-pay | Admitting: *Deleted

## 2019-02-14 NOTE — Telephone Encounter (Signed)
Received fax from [facility], pt is sch for dental work and they need to know if pt has any limitations.  Per Dr Haroldine Laws:  "there are no limitations"  Note faxed back to them at [phone]

## 2019-03-21 NOTE — Progress Notes (Signed)
Office Visit Note  Patient: Holly Davidson             Date of Birth: 09-Nov-1965           MRN: [id number]             PCP: Welford Roche, NP Referring: Welford Roche, NP Visit Date: 04/04/2019 Occupation: @GUAROCC @  Subjective:  Dizziness    History of Present Illness: Holly Davidson is a 53 y.o. female  With history of mixed connective tissue disease, osteoarthritis and fibromyalgia.  She is taking Imuran 50 mg 2 tablets daily.  She continues to take pilocarpine as prescribed for sicca symptoms. She is having increased pain in both hands, both knee joints, and both ankle joints.  She has swelling in both knee joints.  She has been getting steroid injections in her lower back performed by Dr. Mina Marble at [facility].  She states she recently has been experiencing intermittent symptoms of dizziness and memory loss.  She has not been evaluated by a neurologist in the past.   She has also been having increased shortness of breath.  She has not scheduled her yearly appointment with Dr. Haroldine Laws yet. She was evaluated by Dr. Haroldine Laws on 04/2018.  She had an echo with Dr. Leonel Ramsay on 05/07/18.   Activities of Daily Living:  Patient reports morning stiffness for 1-1.5 hours.   Patient Reports nocturnal pain.  Difficulty dressing/grooming: Denies Difficulty climbing stairs: Reports Difficulty getting out of chair: Reports Difficulty using hands for taps, buttons, cutlery, and/or writing: Denies  Review of Systems  Constitutional: Positive for fatigue.  HENT: Positive for mouth dryness. Negative for mouth sores and nose dryness.   Eyes: Positive for dryness. Negative for pain and visual disturbance.  Respiratory: Positive for shortness of breath. Negative for cough, hemoptysis and difficulty breathing.   Cardiovascular: Negative for chest pain, palpitations, hypertension and swelling in legs/feet.  Gastrointestinal: Positive for constipation. Negative for blood in stool and  diarrhea.  Endocrine: Negative for increased urination.  Genitourinary: Negative for difficulty urinating and painful urination.  Musculoskeletal: Positive for arthralgias, joint pain, joint swelling and morning stiffness. Negative for myalgias, muscle weakness, muscle tenderness and myalgias.  Skin: Negative for color change, pallor, rash, hair loss, nodules/bumps, skin tightness, ulcers and sensitivity to sunlight.  Allergic/Immunologic: Negative for susceptible to infections.  Neurological: Positive for dizziness and memory loss. Negative for weakness.  Hematological: Negative for swollen glands.  Psychiatric/Behavioral: Positive for depressed mood, confusion and sleep disturbance. The patient is nervous/anxious.     PMFS History:  Patient Active Problem List   Diagnosis Date Noted   Other fatigue 12/18/2016   Primary insomnia 12/18/2016   Vitamin D deficiency 12/18/2016   Former smoker 12/18/2016   History of cholecystectomy 12/18/2016   History of gastric bypass 12/18/2016   Osteoarthritis of both hands 03/28/2016   Chondromalacia of both patellae 03/28/2016   MCTD (mixed connective tissue disease) ([place]) + ANA, + Ro, + RNP 03/28/2016   Osteoarthritis of both knees 03/28/2016   DDD (degenerative disc disease), lumbar 03/28/2016   Fibromyalgia 03/28/2016   Morbid obesity, Initial weight - 282, BMI - 42.7 09/09/2012    Past Medical History:  Diagnosis Date   Anemia    Anxiety    Arthritis    Back pain    "OFF & ON"   Chondromalacia of both patellae 03/28/2016   Mild   DDD (degenerative disc disease), lumbar 03/28/2016   Difficulty sleeping    occasional -  takes med prn   Fibromyalgia    Herniated lumbar intervertebral disc    Hypertension    Lupus ([facility] [place])    "presents as joint pain, occ. rash   Lupus arthritis ([facility])    MCTD (mixed connective tissue disease) ([place]) 03/28/2016   + ANA, + Ro, + RNP   Osteoarthritis of both hands 03/28/2016    Osteoarthritis of both knees 03/28/2016   Moderate     Family History  Problem Relation Age of Onset   Pulmonary embolism Mother    Cancer Father        colon   COPD Other    Hypertension Other    Hyperlipidemia Other    Stroke Other    Diabetes Other    Cancer Other    Heart disease Other    Rheum arthritis Sister    Autoimmune disease Daughter    Berenice Primas' disease Son    Past Surgical History:  Procedure Laterality Date   ABDOMINAL HYSTERECTOMY     BREATH TEK H PYLORI N/A 09/14/2013   Procedure: BREATH TEK H PYLORI;  Surgeon: Shann Medal, MD;  Location: [facility];  Service: General;  Laterality: N/A;   CESAREAN SECTION     GASTRIC ROUX-EN-Y N/A 03/14/2014   Procedure: LAPAROSCOPIC ROUX-EN-Y GASTRIC BYPASS WITH UPPER ENDOSCOPY;  Surgeon: Alphonsa Overall, MD;  Location: [facility];  Service: General;  Laterality: N/A;   right foot surgery     TUBAL LIGATION     Social History   Social History Narrative   Not on file   Immunization History  Administered Date(s) Administered   Pneumococcal Polysaccharide-23 04/08/2018   Zoster Recombinat (Shingrix) 04/07/2018, 07/28/2018     Objective: Vital Signs: BP 120/87 (BP Location: Left Arm, Patient Position: Sitting, Cuff Size: Large)    Pulse 82    Resp 15    Ht 5' 6"  (1.676 m)    Wt 245 lb 9.6 oz (111.4 kg)    BMI 39.64 kg/m    Physical Exam Vitals signs and nursing note reviewed.  Constitutional:      Appearance: She is well-developed.  HENT:     Head: Normocephalic and atraumatic.  Eyes:     Conjunctiva/sclera: Conjunctivae normal.  Neck:     Musculoskeletal: Normal range of motion.  Cardiovascular:     Rate and Rhythm: Normal rate and regular rhythm.     Heart sounds: Normal heart sounds.  Pulmonary:     Effort: Pulmonary effort is normal.     Breath sounds: Normal breath sounds.  Abdominal:     General: Bowel sounds are normal.     Palpations: Abdomen is soft.  Lymphadenopathy:      Cervical: No cervical adenopathy.  Skin:    General: Skin is warm and dry.     Capillary Refill: Capillary refill takes less than 2 seconds.  Neurological:     Mental Status: She is alert and oriented to person, place, and time.  Psychiatric:        Behavior: Behavior normal.      Musculoskeletal Exam: C-spine, thoracic spine, and lumbar spine good ROM.  No midline spinal tenderness.  Shoulder joints, elbow joints, wrist joints, MCPs, PIPs, and DIPs good ROM with no synovitis. Mild DIP synovial thickening.  Hip joints, knee joints, ankle joints, MTPs, PIPs, and DIPs good ROM with no synovitis.  No warmth or effusion of knee joints.  No tenderness or swelling of ankle joints.    CDAI Exam: CDAI Score: -- Patient Global: --;  Provider Global: -- Swollen: --; Tender: -- Joint Exam   No joint exam has been documented for this visit   There is currently no information documented on the homunculus. [name] and complete the homunculus joint exam.  Investigation: No additional findings.  Imaging: No results found.  Recent Labs: Lab Results  Component Value Date   WBC 5.9 01/28/2019   HGB 11.8 01/28/2019   PLT 354 01/28/2019   NA 139 01/28/2019   K 3.5 01/28/2019   CL 103 01/28/2019   CO2 27 01/28/2019   GLUCOSE 101 (H) 01/28/2019   BUN 17 01/28/2019   CREATININE 0.96 01/28/2019   BILITOT 0.4 01/28/2019   ALKPHOS 90 04/21/2018   AST 16 01/28/2019   ALT 11 01/28/2019   PROT 6.9 01/28/2019   ALBUMIN 4.0 04/21/2018   CALCIUM 9.3 01/28/2019   GFRAA 78 01/28/2019    Speciality Comments: No specialty comments available.  Procedures:  No procedures performed Allergies: Patient has no known allergies.   Assessment / Plan:     Visit Diagnoses: MCTD (mixed connective tissue disease) ([place]) + ANA, + Ro, + RNP -She has not had any signs or symptoms of a flare recently.  She is clinically doing well on Imuran 50 mg 2 tablets daily.  She presents today with  pain in both hands, both knee joints, and both ankle joints.  No tenderness or synovitis was noted on exam. She has not had any recent rashes, photosensitivity, or symptoms of Raynaud's.  She has ongoing sicca symptoms and takes pilocarpine as prescribed.  She was last seen by Dr. Ocie Doyne 05/07/18.  ECHO 05/07/2018: EF 65-70% RV normal. Grade IDD Mild TR. No PAH. She has been having increased SOB recently.  She was advised to schedule a follow up appointment with Dr. Haroldine Laws. She was advised to notify us if she develops any signs or symptoms of a flare.  We will obtain the following lab work today.   She will follow up in 5 months. Plan: CMP, CBC, UA w reflex microscopic, ANA, dsDNA, ESR, C3 and C4, RNP Antibody, Ro, Serum protein electrophoresis with reflex  High risk medication use - Imuran 50 mg 2 tablets daily.  CBC and CMP were drawn today to monitor for drug toxicity. Standing orders are in place.- Plan: CMP, CBC  Primary osteoarthritis of both hands:  She has PIP and DIP synovial thickening consistent with osteoarthritis of both hands. No synovitis noted.  She has complete fist formation bilaterally.  Joint protection and muscle strengthening were discussed.   Primary osteoarthritis of both knees: Chronic pain. She has good ROM of bilateral knee joints.  No warmth or effusion of knee joints noted.    Chondromalacia of both patellae  Fibromyalgia: She has ongoing generalized muscle aches and muscle tenderness due to fibromyalgia.  She has chronic fatigue related to insomnia.  Other fatigue: Chronic and related to insomnia.   Primary insomnia: She has ongoing interrupted sleep at night. Good sleep hygiene discussed.   Trochanteric bursitis, left hip: Resolved   DDD (degenerative disc disease), lumbar: Chronic pain.  She receives steroid injections at [facility] performed by Dr. Mina Marble.   Dizziness -She has been experiencing frequent episodes of dizziness.  A referral to  neurology will be placed today for further evaluation. Plan: Ambulatory referral to Neurology  Memory loss - She has been experiencing worsening symptoms of memory loss and confusion.  A referral to neurology was placed today. Plan: Ambulatory referral to Neurology  Other medical conditions are listed as follows:   History of hypertension  History of gastric bypass  History of cholecystectomy  Vitamin D deficiency  History of depression  History of anxiety  History of anemia  Former smoker   Orders: Orders Placed This Encounter  Procedures   CMP   CBC   UA w reflex microscopic   ANA   dsDNA   ESR   C3 and C4   RNP Antibody   Ro   Serum protein electrophoresis with reflex   Ambulatory referral to Neurology   Meds ordered this encounter  Medications   azaTHIOprine (IMURAN) 50 MG tablet    Sig: Take 2 tablets (100 mg total) by mouth daily.    Dispense:  180 tablet    Refill:  0   pilocarpine (SALAGEN) 5 MG tablet    Sig: TAKE 1 TABLET(5 MG) BY MOUTH THREE TIMES DAILY AS NEEDED    Dispense:  90 tablet    Refill:  2    Face-to-face time spent with patient was 30 minutes. Greater than 50% of time was spent in counseling and coordination of care.  Follow-Up Instructions: Return in about 5 months (around 09/02/2019) for Mixed connective tissue disorder, Osteoarthritis, Fibromyalgia.   Holly Neas, PA-C   I examined and evaluated the patient with Hazel Sams PA.  Patient is clinically stable without any synovitis.  I will obtain labs today to monitor the disease activity.  She has been experiencing dizziness and memory loss.  Will refer her to neurology.  The plan of care was discussed as noted above.  Bo Merino, MD  Note - This record has been created using Editor, commissioning.  Chart creation errors have been sought, but may not always  have been located. Such creation errors do not reflect on  the standard of medical care.

## 2019-04-04 ENCOUNTER — Encounter: Payer: Self-pay | Admitting: Physician Assistant

## 2019-04-04 ENCOUNTER — Ambulatory Visit: Payer: Medicaid Other | Admitting: Rheumatology

## 2019-04-04 ENCOUNTER — Other Ambulatory Visit: Payer: Self-pay

## 2019-04-04 VITALS — BP 120/87 | HR 82 | Resp 15 | Ht 66.0 in | Wt 245.6 lb

## 2019-04-04 DIAGNOSIS — M2242 Chondromalacia patellae, left knee: Secondary | ICD-10-CM

## 2019-04-04 DIAGNOSIS — M797 Fibromyalgia: Secondary | ICD-10-CM

## 2019-04-04 DIAGNOSIS — M19042 Primary osteoarthritis, left hand: Secondary | ICD-10-CM

## 2019-04-04 DIAGNOSIS — Z862 Personal history of diseases of the blood and blood-forming organs and certain disorders involving the immune mechanism: Secondary | ICD-10-CM

## 2019-04-04 DIAGNOSIS — Z79899 Other long term (current) drug therapy: Secondary | ICD-10-CM

## 2019-04-04 DIAGNOSIS — M351 Other overlap syndromes: Secondary | ICD-10-CM

## 2019-04-04 DIAGNOSIS — Z8679 Personal history of other diseases of the circulatory system: Secondary | ICD-10-CM

## 2019-04-04 DIAGNOSIS — M170 Bilateral primary osteoarthritis of knee: Secondary | ICD-10-CM | POA: Diagnosis not present

## 2019-04-04 DIAGNOSIS — Z87891 Personal history of nicotine dependence: Secondary | ICD-10-CM

## 2019-04-04 DIAGNOSIS — M2241 Chondromalacia patellae, right knee: Secondary | ICD-10-CM

## 2019-04-04 DIAGNOSIS — M19041 Primary osteoarthritis, right hand: Secondary | ICD-10-CM

## 2019-04-04 DIAGNOSIS — Z8659 Personal history of other mental and behavioral disorders: Secondary | ICD-10-CM

## 2019-04-04 DIAGNOSIS — R413 Other amnesia: Secondary | ICD-10-CM

## 2019-04-04 DIAGNOSIS — M5136 Other intervertebral disc degeneration, lumbar region: Secondary | ICD-10-CM

## 2019-04-04 DIAGNOSIS — M7062 Trochanteric bursitis, left hip: Secondary | ICD-10-CM

## 2019-04-04 DIAGNOSIS — Z9049 Acquired absence of other specified parts of digestive tract: Secondary | ICD-10-CM

## 2019-04-04 DIAGNOSIS — Z9884 Bariatric surgery status: Secondary | ICD-10-CM

## 2019-04-04 DIAGNOSIS — R42 Dizziness and giddiness: Secondary | ICD-10-CM

## 2019-04-04 DIAGNOSIS — R5383 Other fatigue: Secondary | ICD-10-CM

## 2019-04-04 DIAGNOSIS — E559 Vitamin D deficiency, unspecified: Secondary | ICD-10-CM

## 2019-04-04 DIAGNOSIS — F5101 Primary insomnia: Secondary | ICD-10-CM

## 2019-04-04 MED ORDER — PILOCARPINE HCL 5 MG PO TABS: ORAL_TABLET | ORAL | 2 refills | Status: DC

## 2019-04-04 MED ORDER — AZATHIOPRINE 50 MG PO TABS: 100.0000 mg | ORAL_TABLET | Freq: Every day | ORAL | 0 refills | Status: DC

## 2019-04-04 NOTE — Patient Instructions (Signed)
Standing Labs We placed an order today for your standing lab work.    Please come back and get your standing labs in February and every 3 months  We have open lab daily Monday through Thursday from 8:30-12:30 PM and 1:30-4:30 PM and Friday from 8:30-12:30 PM and 1:30-4:00 PM at the office of Dr. Shaili Deveshwar.   You may experience shorter wait times on Monday and Friday afternoons. The office is located at [address], [address], [place], [place] [address] No appointment is necessary.   Labs are drawn by [facility].  You may receive a bill from [facility] for your lab work.  If you wish to have your labs drawn at another location, please call the office 24 hours in advance to send orders.  If you have any questions regarding directions or hours of operation,  please call [phone].   Just as a reminder please drink plenty of water prior to coming for your lab work. Thanks!  

## 2019-04-07 LAB — COMPLETE METABOLIC PANEL WITH GFR
AG Ratio: 1.3 (calc) (ref 1.0–2.5)
ALT: 12 U/L (ref 6–29)
AST: 16 U/L (ref 10–35)
Albumin: 4.2 g/dL (ref 3.6–5.1)
Alkaline phosphatase (APISO): 77 U/L (ref 37–153)
BUN: 18 mg/dL (ref 7–25)
CO2: 34 mmol/L — ABNORMAL HIGH (ref 20–32)
Calcium: 9.9 mg/dL (ref 8.6–10.4)
Chloride: 98 mmol/L (ref 98–110)
Creat: 0.86 mg/dL (ref 0.50–1.05)
GFR, Est African American: 89 mL/min/{1.73_m2} (ref >=60)
GFR, Est Non African American: 77 mL/min/{1.73_m2} (ref >=60)
Globulin: 3.3 g/dL (calc) (ref 1.9–3.7)
Glucose, Bld: 92 mg/dL (ref 65–99)
Potassium: 3.6 mmol/L (ref 3.5–5.3)
Sodium: 140 mmol/L (ref 135–146)
Total Bilirubin: 0.5 mg/dL (ref 0.2–1.2)
Total Protein: 7.5 g/dL (ref 6.1–8.1)

## 2019-04-07 LAB — URINALYSIS, ROUTINE W REFLEX MICROSCOPIC
Bilirubin Urine: NEGATIVE
Glucose, UA: NEGATIVE
Hgb urine dipstick: NEGATIVE
Ketones, ur: NEGATIVE
Leukocytes,Ua: NEGATIVE
Nitrite: NEGATIVE
Protein, ur: NEGATIVE
Specific Gravity, Urine: 1.014 (ref 1.001–1.03)
pH: 5.5 (ref 5.0–8.0)

## 2019-04-07 LAB — SJOGRENS SYNDROME-A EXTRACTABLE NUCLEAR ANTIBODY: SSA (Ro) (ENA) Antibody, IgG: 5.1 AI — AB

## 2019-04-07 LAB — PROTEIN ELECTROPHORESIS, SERUM, WITH REFLEX
Albumin ELP: 3.9 g/dL (ref 3.8–4.8)
Alpha 1: 0.3 g/dL (ref 0.2–0.3)
Alpha 2: 0.9 g/dL (ref 0.5–0.9)
Beta 2: 0.5 g/dL (ref 0.2–0.5)
Beta Globulin: 0.5 g/dL (ref 0.4–0.6)
Gamma Globulin: 1.4 g/dL (ref 0.8–1.7)
Total Protein: 7.5 g/dL (ref 6.1–8.1)

## 2019-04-07 LAB — CBC WITH DIFFERENTIAL/PLATELET
Absolute Monocytes: 515 cells/uL (ref 200–950)
Basophils Absolute: 29 cells/uL (ref 0–200)
Basophils Relative: 0.6 %
Eosinophils Absolute: 250 cells/uL (ref 15–500)
Eosinophils Relative: 5.1 %
HCT: 40.4 % (ref 35.0–45.0)
Hemoglobin: 12.9 g/dL (ref 11.7–15.5)
Lymphs Abs: 2122 cells/uL (ref 850–3900)
MCH: 24.5 pg — ABNORMAL LOW (ref 27.0–33.0)
MCHC: 31.9 g/dL — ABNORMAL LOW (ref 32.0–36.0)
MCV: 76.8 fL — ABNORMAL LOW (ref 80.0–100.0)
MPV: 9.9 fL (ref 7.5–12.5)
Monocytes Relative: 10.5 %
Neutro Abs: 1985 cells/uL (ref 1500–7800)
Neutrophils Relative %: 40.5 %
Platelets: 395 10*3/uL (ref 140–400)
RBC: 5.26 10*6/uL — ABNORMAL HIGH (ref 3.80–5.10)
RDW: 15.7 % — ABNORMAL HIGH (ref 11.0–15.0)
Total Lymphocyte: 43.3 %
WBC: 4.9 10*3/uL (ref 3.8–10.8)

## 2019-04-07 LAB — ANTI-NUCLEAR AB-TITER (ANA TITER)
ANA TITER: 1:40 {titer} — ABNORMAL HIGH
ANA Titer 1: 1:320 {titer} — ABNORMAL HIGH

## 2019-04-07 LAB — IFE INTERPRETATION: Immunofix Electr Int: NOT DETECTED

## 2019-04-07 LAB — C3 AND C4
C3 Complement: 148 mg/dL (ref 83–193)
C4 Complement: 37 mg/dL (ref 15–57)

## 2019-04-07 LAB — SEDIMENTATION RATE: Sed Rate: 33 mm/h — ABNORMAL HIGH (ref 0–30)

## 2019-04-07 LAB — ANTI-DNA ANTIBODY, DOUBLE-STRANDED: ds DNA Ab: 2 IU/mL

## 2019-04-07 LAB — ANA: Anti Nuclear Antibody (ANA): POSITIVE — AB

## 2019-04-07 LAB — RNP ANTIBODY: Ribonucleic Protein(ENA) Antibody, IgG: 1.9 AI — AB

## 2019-04-07 NOTE — Progress Notes (Signed)
Labs are stable.  IFE did not reveal any monoclonal proteins.   Complements WNL.  DsDNA negative. Ro ANA, and RNP remain positive but stable. UA normal.

## 2019-04-08 ENCOUNTER — Encounter: Payer: Self-pay | Admitting: Neurology

## 2019-04-08 ENCOUNTER — Ambulatory Visit (INDEPENDENT_AMBULATORY_CARE_PROVIDER_SITE_OTHER): Payer: Medicaid Other | Admitting: Neurology

## 2019-04-08 ENCOUNTER — Other Ambulatory Visit: Payer: Self-pay

## 2019-04-08 VITALS — Ht 66.0 in | Wt 242.0 lb

## 2019-04-08 DIAGNOSIS — R42 Dizziness and giddiness: Secondary | ICD-10-CM | POA: Diagnosis not present

## 2019-04-08 DIAGNOSIS — R413 Other amnesia: Secondary | ICD-10-CM | POA: Diagnosis not present

## 2019-04-08 NOTE — Progress Notes (Signed)
Virtual Visit via Video Note The purpose of this virtual visit is to provide medical care while limiting exposure to the novel coronavirus.    Consent was obtained for video visit:  Yes.   Answered questions that patient had about telehealth interaction:  Yes.   I discussed the limitations, risks, security and privacy concerns of performing an evaluation and management service by telemedicine. I also discussed with the patient that there may be a patient responsible charge related to this service. The patient expressed understanding and agreed to proceed.  Pt location: Home Physician Location: office Name of referring provider:  Gearldine BienenstockDale, Taylor M, PA-C I connected with Holly Davidson at patients initiation/request on 04/08/2019 at 10:30 AM EST by video enabled telemedicine application and verified that I am speaking with the correct person using two identifiers. Pt MRN:  [id number] Pt DOB:  12/08/1965 Video Participants:  Holly Davidson   History of Present Illness:  This is a 53 year old right-handed woman with a history of mixed connective tissue disease, osteoarthritis, fibromyalgia, presenting for evaluation of dizziness and memory changes.   1. Dizziness. Dizziness started over a month ago. She describes a lightheaded sensation sometimes associated with feeling sick to her stomach, usually occurring when standing, but may also occur when supine. They last 1-2 minutes then gradually fade away, but can occur 2-3 times a day. She has an associated headache, and vision would be blurred. Headache is frontal, throbbing, 4/10 in intensity. She usually takes a Tylenol or uses a heat pad. There is no dysarthria/dysphagia, focal numbness/tingling/weakness. No vomiting. She has chronic neck and back pain from fibromyalgia. She usually lies down, and denies any falls or passing out from the dizziness. She lives with her son who has not mentioned any staring/unresponsive episodes. She denies any gaps in  time, olfactory/gustatory hallucination, myoclonic jerks.   2. Memory changes. She started noticing memory changes a couple of months ago. She would forget what she was talking about. Sometimes she does not remember if she took her medication, she uses a pillbox and would still see the medication in the slot. She forgets her bills sometimes but states there is so much going on at times. She denies getting lost driving. She has not been told she repeats herself. Her maternal grandmother had Alzheimer's disease. She denies any history of significant head injuries or alcohol use. She does not sleep well, usually with 4 hours of sleep due to pain. She feels drowsy during the day. For the past few months, she reports occasional hallucinations where she thinks something passed by the door or she hears someone coming up her steps. She has thought of changing the locks on her doors. Sometimes something would be out of place and she wonders if she had done it. She has a history of B12 deficiency and was previously on B12 injections until she had a new PCP, she states her last injection was a few months ago.      Current Outpatient Medications on File Prior to Visit  Medication Sig Dispense Refill   acetaminophen (TYLENOL) 500 MG tablet Take 1,000 mg by mouth every 6 (six) hours as needed for mild pain or moderate pain.     azaTHIOprine (IMURAN) 50 MG tablet Take 2 tablets (100 mg total) by mouth daily. 180 tablet 0   Calcium Carbonate-Vit D-Min (CALTRATE 600+D PLUS PO) Take 1 tablet by mouth every morning.      cyclobenzaprine (FLEXERIL) 10 MG tablet Take 10 mg  by mouth 3 (three) times daily as needed for muscle spasms.     diclofenac sodium (VOLTAREN) 1 % GEL Apply 2 to 4 g to affected area up to 4 times daily as needed. 4 Tube 2   escitalopram (LEXAPRO) 20 MG tablet Take 20 mg by mouth as needed.      furosemide (LASIX) 20 MG tablet TK 1 T PO BID PRF SWELLING  0   hydrochlorothiazide (HYDRODIURIL) 25  MG tablet Take 25 mg by mouth daily.     hydrOXYzine (ATARAX/VISTARIL) 50 MG tablet Take 50 mg by mouth 3 (three) times daily as needed.     lamoTRIgine (LAMICTAL) 25 MG tablet Take 25 mg by mouth 2 (two) times daily as needed.   2   Multiple Vitamin (MULTIVITAMIN WITH MINERALS) TABS tablet Take 1 tablet by mouth every morning.      nitroGLYCERIN (NITROSTAT) 0.4 MG SL tablet      OxyCODONE HCl 15 MG TABA Take 15 mg by mouth 4 (four) times daily as needed.      pilocarpine (SALAGEN) 5 MG tablet TAKE 1 TABLET(5 MG) BY MOUTH THREE TIMES DAILY AS NEEDED 90 tablet 2   polyvinyl alcohol (LIQUIFILM TEARS) 1.4 % ophthalmic solution Place 2 drops into both eyes daily as needed for dry eyes.     potassium chloride (K-DUR,KLOR-CON) 10 MEQ tablet 20 mEq.   0   pregabalin (LYRICA) 100 MG capsule Take 100 mg by mouth 3 (three) times daily.     traZODone (DESYREL) 100 MG tablet Take 100 mg by mouth as needed.      No current facility-administered medications on file prior to visit.      Observations/Objective:   Vitals:   04/08/19 0916  Weight: 242 lb (109.8 kg)  Height: 5\' 6"  (1.676 m)   GEN:  The patient appears stated age and is in NAD.  Neurological examination: Patient is awake, alert, oriented x 3. No aphasia or dysarthria. Intact fluency and comprehension. Remote and recent memory intact. SLUMS 27/30. [place] Mental Exam 04/08/2019  Weekday Correct 1  Current year 1  What state are we in? 1  Amount spent 1  Amount left 2  # of Animals 3  5 objects recall 4  Number series 2  Hour markers 2  Time correct 2  Placed X in triangle correctly 1  Largest Figure 1  Name of female 2  Date back to work 2  Type of work 2  State she lived in 0  Total score 27   Cranial nerves: Extraocular movements intact with no nystagmus. No facial asymmetry. Motor: moves all extremities symmetrically, at least anti-gravity x 4. No incoordination on finger to nose testing. Gait:  narrow-based and steady, able to tandem walk adequately. Negative Romberg test.  Assessment and Plan: This is a 53 year old right-handed woman with a history of mixed connective tissue disease, osteoarthritis, fibromyalgia, presenting for evaluation of dizziness and memory changes. Her neurological exam (although limited on video) is non-focal, SLUMS score today normal 27/30. Etiology of dizziness unclear, she describes a headache and vision changes, there may be a positional component. MRI brain with and without contrast will be ordered to assess for underlying structural abnormality. We discussed different causes of memory changes, check TSH and B12. MRI brain as above. We discussed how pain, sleep, and mood issues can also contribute to cognitive difficulties. Continue working with her physicians on these issues. Neuropsychological testing will be ordered to further evaluate cognitive symptoms.  We discussed the importance of control of vascular risk factors, physical exercise, and brain stimulation exercises for brain health. Follow-up in 6 months, she knows to call for any changes.   Follow Up Instructions:   -I discussed the assessment and treatment plan with the patient. The patient was provided an opportunity to ask questions and all were answered. The patient agreed with the plan and demonstrated an understanding of the instructions.   The patient was advised to call back or seek an in-person evaluation if the symptoms worsen or if the condition fails to improve as anticipated.    Van Clines, MD

## 2019-04-11 ENCOUNTER — Encounter: Payer: Self-pay | Admitting: Neurology

## 2019-05-07 ENCOUNTER — Ambulatory Visit
Admission: RE | Admit: 2019-05-07 | Discharge: 2019-05-07 | Disposition: A | Discharge status: Home / Self Care | Payer: Medicaid Other | Source: Ambulatory Visit | Attending: Neurology | Admitting: Neurology

## 2019-05-07 DIAGNOSIS — R42 Dizziness and giddiness: Secondary | ICD-10-CM

## 2019-05-07 DIAGNOSIS — R413 Other amnesia: Secondary | ICD-10-CM

## 2019-05-07 MED ORDER — GADOBENATE DIMEGLUMINE 529 MG/ML IV SOLN
20.0000 mL | Freq: Once | INTRAVENOUS | Status: AC | PRN
  Administered 2019-05-07: 20 mL via INTRAVENOUS

## 2019-05-09 ENCOUNTER — Telehealth: Payer: Self-pay

## 2019-05-09 NOTE — Telephone Encounter (Signed)
Tried calling pt. No answer and vmail is full. ?

## 2019-05-09 NOTE — Telephone Encounter (Signed)
Pt returned called and informed of results. No concerns at this time.

## 2019-05-09 NOTE — Telephone Encounter (Signed)
-----   Message from Cameron Sprang, MD sent at 05/08/2019 10:06 PM EST ----- Pls let patient know I reviewed MRI brain, no evidence of tumor, stroke, or bleed. It shows age-related changes. Thanks

## 2019-06-14 ENCOUNTER — Ambulatory Visit: Payer: Medicaid Other | Admitting: Neurology

## 2019-06-23 ENCOUNTER — Telehealth: Payer: Self-pay

## 2019-06-23 MED ORDER — AZATHIOPRINE 50 MG PO TABS: 100.0000 mg | ORAL_TABLET | Freq: Every day | ORAL | 0 refills | Status: DC

## 2019-06-23 NOTE — Telephone Encounter (Signed)
Refill request received via fax from [facility] on [address] In [place] for imuran.   Last Visit: 04/04/2019  Next Visit: message sent to the front desk to schedule.  Labs: 04/04/2019 Labs are stable.  IFE did not reveal any monoclonal proteins.  Complements WNL. DsDNA negative. Ro ANA, and RNP remain positive but stable. UA normal.   Okay to refill per Dr. Corliss Skains.

## 2019-06-29 ENCOUNTER — Encounter (HOSPITAL_COMMUNITY): Payer: Self-pay | Admitting: Internal Medicine

## 2019-06-29 ENCOUNTER — Other Ambulatory Visit: Payer: Self-pay

## 2019-06-29 ENCOUNTER — Ambulatory Visit (HOSPITAL_COMMUNITY)
Admission: RE | Admit: 2019-06-29 | Discharge: 2019-06-29 | Disposition: A | Discharge status: Home / Self Care | Payer: Medicaid Other | Source: Ambulatory Visit | Attending: Internal Medicine | Admitting: Internal Medicine

## 2019-06-29 VITALS — BP 112/71 | HR 72 | Wt 243.4 lb

## 2019-06-29 DIAGNOSIS — R079 Chest pain, unspecified: Secondary | ICD-10-CM

## 2019-06-29 DIAGNOSIS — M351 Other overlap syndromes: Secondary | ICD-10-CM | POA: Diagnosis not present

## 2019-06-29 DIAGNOSIS — Z6839 Body mass index (BMI) 39.0-39.9, adult: Secondary | ICD-10-CM | POA: Insufficient documentation

## 2019-06-29 DIAGNOSIS — Z9884 Bariatric surgery status: Secondary | ICD-10-CM | POA: Diagnosis not present

## 2019-06-29 DIAGNOSIS — M797 Fibromyalgia: Secondary | ICD-10-CM | POA: Insufficient documentation

## 2019-06-29 DIAGNOSIS — Z79899 Other long term (current) drug therapy: Secondary | ICD-10-CM | POA: Insufficient documentation

## 2019-06-29 DIAGNOSIS — M1990 Unspecified osteoarthritis, unspecified site: Secondary | ICD-10-CM | POA: Diagnosis not present

## 2019-06-29 DIAGNOSIS — I10 Essential (primary) hypertension: Secondary | ICD-10-CM | POA: Diagnosis not present

## 2019-06-29 DIAGNOSIS — Z800 Family history of malignant neoplasm of digestive organs: Secondary | ICD-10-CM | POA: Insufficient documentation

## 2019-06-29 DIAGNOSIS — F419 Anxiety disorder, unspecified: Secondary | ICD-10-CM | POA: Insufficient documentation

## 2019-06-29 DIAGNOSIS — Z87891 Personal history of nicotine dependence: Secondary | ICD-10-CM | POA: Diagnosis not present

## 2019-06-29 DIAGNOSIS — R0789 Other chest pain: Secondary | ICD-10-CM | POA: Insufficient documentation

## 2019-06-29 DIAGNOSIS — Z8261 Family history of arthritis: Secondary | ICD-10-CM | POA: Insufficient documentation

## 2019-06-29 DIAGNOSIS — Z8349 Family history of other endocrine, nutritional and metabolic diseases: Secondary | ICD-10-CM | POA: Insufficient documentation

## 2019-06-29 LAB — CBC
HCT: 40.7 % (ref 36.0–46.0)
Hemoglobin: 13 g/dL (ref 12.0–15.0)
MCH: 24.4 pg — ABNORMAL LOW (ref 26.0–34.0)
MCHC: 31.9 g/dL (ref 30.0–36.0)
MCV: 76.4 fL — ABNORMAL LOW (ref 80.0–100.0)
Platelets: 409 10*3/uL — ABNORMAL HIGH (ref 150–400)
RBC: 5.33 MIL/uL — ABNORMAL HIGH (ref 3.87–5.11)
RDW: 16.5 % — ABNORMAL HIGH (ref 11.5–15.5)
WBC: 8.1 10*3/uL (ref 4.0–10.5)
nRBC: 0 % (ref 0.0–0.2)

## 2019-06-29 LAB — BASIC METABOLIC PANEL
Anion gap: 12 (ref 5–15)
BUN: 17 mg/dL (ref 6–20)
CO2: 29 mmol/L (ref 22–32)
Calcium: 9.1 mg/dL (ref 8.9–10.3)
Chloride: 98 mmol/L (ref 98–111)
Creatinine, Ser: 0.84 mg/dL (ref 0.44–1.00)
GFR calc Af Amer: >60 mL/min (ref >60)
GFR calc non Af Amer: >60 mL/min (ref >60)
Glucose, Bld: 106 mg/dL — ABNORMAL HIGH (ref 70–99)
Potassium: 3.3 mmol/L — ABNORMAL LOW (ref 3.5–5.1)
Sodium: 139 mmol/L (ref 135–145)

## 2019-06-29 LAB — PROTIME-INR
INR: 1 (ref 0.8–1.2)
Prothrombin Time: 13 seconds (ref 11.4–15.2)

## 2019-06-29 MED ORDER — SODIUM CHLORIDE 0.9% FLUSH: 3.0000 mL | Freq: Two times a day (BID) | INTRAVENOUS | Status: DC

## 2019-06-29 NOTE — H&P (View-Only) (Signed)
ADVANCED HF CLINIC NOTE  Referring Physician: Dr. Kirke Corin Primary Cardiologist: None  HPI: Ms Rieger is a 54 y/o woman with morbid obesity, gastric bypass, HTN, lupus and MCTD referred by Dr. Patrecia Pour for screening for [facility] in setting of her CTD. Quit smoking in 2000 ( < 1ppd x 30 years).   Today she returns for 1 year follow up. Remains on Imuran. Overall feeling ok but says she has been having more frequent central CP recently. 1-2 episodes per day last 5-15 mins and resolves with NTG. Can come at any time. Can radiate to neck and shoulder. Not related to exertion. No palpitations. No N/V. Gets SOb with mild exertion.   ECHO 05/07/2018  EF 65-70% RV normal. Grade IDD Mild TR. No PAH. Personally reviewed   Echo 2018  EF 65-70% RV normal. Normal diastolic parameters. Mild TR no significant PAH  PFTs 05/07/2018  FEV1 2.54 DLCO  65%   PFTs 01/22/17 FEV1 2.6L (105%) FVC 2.94 (95%) DLCO 58%     Past Medical History:  Diagnosis Date  . Anemia   . Anxiety   . Arthritis   . Back pain    "OFF & ON"  . 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 DDD (degenerative disc disease), lumbar 03/28/2016  . Difficulty sleeping    occasional - takes med prn  . Fibromyalgia   . Herniated lumbar intervertebral disc   . Hypertension   . Lupus ([place])    "presents as joint pain, occ. rash  . Lupus arthritis ([place])   . MCTD (mixed connective tissue disease) ([place]) 03/28/2016   + ANA, + Ro, + RNP  . Osteoarthritis of both hands 03/28/2016  . Osteoarthritis of both knees 03/28/2016   Moderate     Current Outpatient Medications  Medication Sig Dispense Refill  . acetaminophen (TYLENOL) 500 MG tablet Take 1,000 mg by mouth every 6 (six) hours as needed for mild pain or moderate pain.    Marland Kitchen azaTHIOprine (IMURAN) 50 MG tablet Take 2 tablets (100 mg total) by mouth daily. 180 tablet 0  . cyclobenzaprine (FLEXERIL) 10 MG tablet Take 10 mg by mouth 3 (three) times daily as needed for  muscle spasms.    . diclofenac sodium (VOLTAREN) 1 % GEL Apply 2 to 4 g to affected area up to 4 times daily as needed. 4 Tube 2  . furosemide (LASIX) 20 MG tablet TK 1 T PO BID PRF SWELLING  0  . hydrochlorothiazide (HYDRODIURIL) 25 MG tablet Take 25 mg by mouth daily.    . hydrOXYzine (ATARAX/VISTARIL) 50 MG tablet Take 50 mg by mouth 3 (three) times daily as needed.    . lamoTRIgine (LAMICTAL) 25 MG tablet Take 25 mg by mouth 2 (two) times daily as needed.   2  . nitroGLYCERIN (NITROSTAT) 0.4 MG SL tablet Place 0.4 mg under the tongue every 5 (five) minutes as needed for chest pain.     . OxyCODONE HCl 15 MG TABA Take 15 mg by mouth 4 (four) times daily as needed.     . pilocarpine (SALAGEN) 5 MG tablet TAKE 1 TABLET(5 MG) BY MOUTH THREE TIMES DAILY AS NEEDED 90 tablet 2  . polyvinyl alcohol (LIQUIFILM TEARS) 1.4 % ophthalmic solution Place 2 drops into both eyes daily as needed for dry eyes.    . potassium chloride SA (KLOR-CON) 20 MEQ tablet 20 mEq 2 (two) times daily.   0  . pregabalin (LYRICA) 150 MG capsule Take 150  mg by mouth 3 (three) times daily.     . traZODone (DESYREL) 150 MG tablet Take 150 mg by mouth as needed.      No current facility-administered medications for this encounter.    No Known Allergies    Social History   Socioeconomic History  . Marital status: Married    Spouse name: Not on file  . Number of children: Not on file  . Years of education: Not on file  . Highest education level: Not on file  Occupational History  . Not on file  Tobacco Use  . Smoking status: Former Smoker    Quit date: 09/10/1998    Years since quitting: 20.8  . Smokeless tobacco: Never Used  Substance and Sexual Activity  . Alcohol use: No  . Drug use: No  . Sexual activity: Not on file  Other Topics Concern  . Not on file  Social History Narrative   Right handed      Highest level of edu- Assoc      Has a 52 year old son. Lives in 2 story home   Social Determinants of  Health   Financial Resource Strain:   . Difficulty of Paying Living Expenses: Not on file  Food Insecurity:   . Worried About Programme researcher, broadcasting/film/video in the Last Year: Not on file  . Ran Out of Food in the Last Year: Not on file  Transportation Needs:   . Lack of Transportation (Medical): Not on file  . Lack of Transportation (Non-Medical): Not on file  Physical Activity:   . Days of Exercise per Week: Not on file  . Minutes of Exercise per Session: Not on file  Stress:   . Feeling of Stress : Not on file  Social Connections:   . Frequency of Communication with Friends and Family: Not on file  . Frequency of Social Gatherings with Friends and Family: Not on file  . Attends Religious Services: Not on file  . Active Member of Clubs or Organizations: Not on file  . Attends Banker Meetings: Not on file  . Marital Status: Not on file  Intimate Partner Violence:   . Fear of Current or Ex-Partner: Not on file  . Emotionally Abused: Not on file  . Physically Abused: Not on file  . Sexually Abused: Not on file      Family History  Problem Relation Age of Onset  . Pulmonary embolism Mother   . Cancer Father        colon  . COPD Other   . Hypertension Other   . Hyperlipidemia Other   . Stroke Other   . Diabetes Other   . Cancer Other   . Heart disease Other   . Rheum arthritis Sister   . Autoimmune disease Daughter   . Graves' disease Son     Vitals:   06/29/19 1404  BP: 112/71  Pulse: 72  SpO2: 99%  Weight: 110.4 kg (243 lb 6.4 oz)   Wt Readings from Last 3 Encounters:  06/29/19 110.4 kg (243 lb 6.4 oz)  04/08/19 109.8 kg (242 lb)  04/04/19 111.4 kg (245 lb 9.6 oz)     PHYSICAL EXAM: General:  Well appearing. No resp difficulty HEENT: normal Neck: supple. no JVD. Carotids 2+ bilat; no bruits. No lymphadenopathy or thryomegaly appreciated. Cor: PMI nondisplaced. Regular rate & rhythm. No rubs, gallops or murmurs. Lungs: clear Abdomen: obese soft,  nontender, nondistended. No hepatosplenomegaly. No bruits or masses. Good bowel  sounds. Extremities: no cyanosis, clubbing, rash, edema Neuro: alert & orientedx3, cranial nerves grossly intact. moves all 4 extremities w/o difficulty. Affect pleasant  ECG: NSR 62 anterior qs No ST-T wave abnormalities. Personally reviewed    ASSESSMENT & PLAN:  1. Chest pressure - typical and atypical features but clearly progressive and NTG responsive.  - multiple CRFs - will arrange L/R heart cath - given rapid off/on may be arrhythmia as well but denies palpitations. She will follow HR on her FitBit  2. Lupus/mixed connective tissue disease - Dicussed incidence of PAH in patients with CTD in detail - Echos have not been suggestive of PAH - Will do RHC at time of coronary angio  completely normal. Suspect possibly due to poor precordial lead placement.    Karema Tocci, MD  2:25 PM    

## 2019-06-29 NOTE — Addendum Note (Signed)
Encounter addended by: Nicole Cella, RN on: 06/29/2019 2:47 PM  Actions taken: Visit diagnoses modified, Order list changed, Diagnosis association updated, Charge Capture section accepted, Clinical Note Signed

## 2019-06-29 NOTE — Patient Instructions (Addendum)
    [facility] [id number] [place] [address] Dept: [phone] Loc: [phone]  Cherry STEVEN VEAZIE  06/29/2019  You are scheduled for a Cardiac Catheterization on Friday, February 12 with Dr. Arvilla Meres.  1. Please arrive at the [facility] (Main Entrance A) at [facility]: [address] [place], [place] [address] at 11:00 AM (This time is two hours before your procedure to ensure your preparation). Free valet parking service is available.   Special note: Every effort is made to have your procedure done on time. Please understand that emergencies sometimes delay scheduled procedures.  2. Diet: Do not eat solid foods after midnight.  The patient may have clear liquids until 5am upon the day of the procedure.  3. You will need to have a covid 19 test done Thursday February 11th at 9:20am. This will be done at [facility] [place], [place] [address]. This will be a drive by test. Please socially isolate until your procedure after having this test.  4. Medication instructions in preparation for your procedure:   Contrast Allergy: No  DO NOT TAKE Furosemide and Hydrochlorothiazide the MORNING OF YOUR PROCEDURE. You may take it after your procedure unless otherwise directed by your provider.    On the morning of your procedure, take your morning medicines NOT listed above.  You may use sips of water.  5. Plan for one night stay--bring personal belongings. 6. Bring a current list of your medications and current insurance cards. 7. You MUST have a responsible person to drive you home. 8. Someone MUST be with you the first 24 hours after you arrive home or your discharge will be delayed. 9. Please wear clothes that are easy to get on and off and wear slip-on shoes.  Thank you for allowing [place] to care for you!   -- [facility]

## 2019-06-29 NOTE — Progress Notes (Signed)
ADVANCED HF CLINIC NOTE  Referring Physician: Dr. Kirke Corin Primary Cardiologist: None  HPI: Ms Rieger is a 54 y/o woman with morbid obesity, gastric bypass, HTN, lupus and MCTD referred by Dr. Patrecia Pour for screening for [facility] in setting of her CTD. Quit smoking in 2000 ( < 1ppd x 30 years).   Today she returns for 1 year follow up. Remains on Imuran. Overall feeling ok but says she has been having more frequent central CP recently. 1-2 episodes per day last 5-15 mins and resolves with NTG. Can come at any time. Can radiate to neck and shoulder. Not related to exertion. No palpitations. No N/V. Gets SOb with mild exertion.   ECHO 05/07/2018  EF 65-70% RV normal. Grade IDD Mild TR. No PAH. Personally reviewed   Echo 2018  EF 65-70% RV normal. Normal diastolic parameters. Mild TR no significant PAH  PFTs 05/07/2018  FEV1 2.54 DLCO  65%   PFTs 01/22/17 FEV1 2.6L (105%) FVC 2.94 (95%) DLCO 58%     Past Medical History:  Diagnosis Date  . Anemia   . Anxiety   . Arthritis   . Back pain    "OFF & ON"  . 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 DDD (degenerative disc disease), lumbar 03/28/2016  . Difficulty sleeping    occasional - takes med prn  . Fibromyalgia   . Herniated lumbar intervertebral disc   . Hypertension   . Lupus ([place])    "presents as joint pain, occ. rash  . Lupus arthritis ([place])   . MCTD (mixed connective tissue disease) ([place]) 03/28/2016   + ANA, + Ro, + RNP  . Osteoarthritis of both hands 03/28/2016  . Osteoarthritis of both knees 03/28/2016   Moderate     Current Outpatient Medications  Medication Sig Dispense Refill  . acetaminophen (TYLENOL) 500 MG tablet Take 1,000 mg by mouth every 6 (six) hours as needed for mild pain or moderate pain.    Marland Kitchen azaTHIOprine (IMURAN) 50 MG tablet Take 2 tablets (100 mg total) by mouth daily. 180 tablet 0  . cyclobenzaprine (FLEXERIL) 10 MG tablet Take 10 mg by mouth 3 (three) times daily as needed for  muscle spasms.    . diclofenac sodium (VOLTAREN) 1 % GEL Apply 2 to 4 g to affected area up to 4 times daily as needed. 4 Tube 2  . furosemide (LASIX) 20 MG tablet TK 1 T PO BID PRF SWELLING  0  . hydrochlorothiazide (HYDRODIURIL) 25 MG tablet Take 25 mg by mouth daily.    . hydrOXYzine (ATARAX/VISTARIL) 50 MG tablet Take 50 mg by mouth 3 (three) times daily as needed.    . lamoTRIgine (LAMICTAL) 25 MG tablet Take 25 mg by mouth 2 (two) times daily as needed.   2  . nitroGLYCERIN (NITROSTAT) 0.4 MG SL tablet Place 0.4 mg under the tongue every 5 (five) minutes as needed for chest pain.     . OxyCODONE HCl 15 MG TABA Take 15 mg by mouth 4 (four) times daily as needed.     . pilocarpine (SALAGEN) 5 MG tablet TAKE 1 TABLET(5 MG) BY MOUTH THREE TIMES DAILY AS NEEDED 90 tablet 2  . polyvinyl alcohol (LIQUIFILM TEARS) 1.4 % ophthalmic solution Place 2 drops into both eyes daily as needed for dry eyes.    . potassium chloride SA (KLOR-CON) 20 MEQ tablet 20 mEq 2 (two) times daily.   0  . pregabalin (LYRICA) 150 MG capsule Take 150  mg by mouth 3 (three) times daily.     . traZODone (DESYREL) 150 MG tablet Take 150 mg by mouth as needed.      No current facility-administered medications for this encounter.    No Known Allergies    Social History   Socioeconomic History  . Marital status: Married    Spouse name: Not on file  . Number of children: Not on file  . Years of education: Not on file  . Highest education level: Not on file  Occupational History  . Not on file  Tobacco Use  . Smoking status: Former Smoker    Quit date: 09/10/1998    Years since quitting: 20.8  . Smokeless tobacco: Never Used  Substance and Sexual Activity  . Alcohol use: No  . Drug use: No  . Sexual activity: Not on file  Other Topics Concern  . Not on file  Social History Narrative   Right handed      Highest level of edu- Assoc      Has a 52 year old son. Lives in 2 story home   Social Determinants of  Health   Financial Resource Strain:   . Difficulty of Paying Living Expenses: Not on file  Food Insecurity:   . Worried About Programme researcher, broadcasting/film/video in the Last Year: Not on file  . Ran Out of Food in the Last Year: Not on file  Transportation Needs:   . Lack of Transportation (Medical): Not on file  . Lack of Transportation (Non-Medical): Not on file  Physical Activity:   . Days of Exercise per Week: Not on file  . Minutes of Exercise per Session: Not on file  Stress:   . Feeling of Stress : Not on file  Social Connections:   . Frequency of Communication with Friends and Family: Not on file  . Frequency of Social Gatherings with Friends and Family: Not on file  . Attends Religious Services: Not on file  . Active Member of Clubs or Organizations: Not on file  . Attends Banker Meetings: Not on file  . Marital Status: Not on file  Intimate Partner Violence:   . Fear of Current or Ex-Partner: Not on file  . Emotionally Abused: Not on file  . Physically Abused: Not on file  . Sexually Abused: Not on file      Family History  Problem Relation Age of Onset  . Pulmonary embolism Mother   . Cancer Father        colon  . COPD Other   . Hypertension Other   . Hyperlipidemia Other   . Stroke Other   . Diabetes Other   . Cancer Other   . Heart disease Other   . Rheum arthritis Sister   . Autoimmune disease Daughter   . Graves' disease Son     Vitals:   06/29/19 1404  BP: 112/71  Pulse: 72  SpO2: 99%  Weight: 110.4 kg (243 lb 6.4 oz)   Wt Readings from Last 3 Encounters:  06/29/19 110.4 kg (243 lb 6.4 oz)  04/08/19 109.8 kg (242 lb)  04/04/19 111.4 kg (245 lb 9.6 oz)     PHYSICAL EXAM: General:  Well appearing. No resp difficulty HEENT: normal Neck: supple. no JVD. Carotids 2+ bilat; no bruits. No lymphadenopathy or thryomegaly appreciated. Cor: PMI nondisplaced. Regular rate & rhythm. No rubs, gallops or murmurs. Lungs: clear Abdomen: obese soft,  nontender, nondistended. No hepatosplenomegaly. No bruits or masses. Good bowel  sounds. Extremities: no cyanosis, clubbing, rash, edema Neuro: alert & orientedx3, cranial nerves grossly intact. moves all 4 extremities w/o difficulty. Affect pleasant  ECG: NSR 62 anterior qs No ST-T wave abnormalities. Personally reviewed    ASSESSMENT & PLAN:  1. Chest pressure - typical and atypical features but clearly progressive and NTG responsive.  - multiple CRFs - will arrange L/R heart cath - given rapid off/on may be arrhythmia as well but denies palpitations. She will follow HR on her FitBit  2. Lupus/mixed connective tissue disease - Dicussed incidence of PAH in patients with CTD in detail - Echos have not been suggestive of PAH - Will do RHC at time of coronary angio  completely normal. Suspect possibly due to poor precordial lead placement.    Arvilla Meres, MD  2:25 PM

## 2019-06-29 NOTE — Addendum Note (Signed)
Encounter addended by: Nicole Cella, RN on: 06/29/2019 4:46 PM  Actions taken: Order list changed, Diagnosis association updated

## 2019-06-29 NOTE — Addendum Note (Signed)
Encounter addended by: Nicole Cella, RN on: 06/29/2019 3:46 PM  Actions taken: Order list changed, Diagnosis association updated

## 2019-06-29 NOTE — Addendum Note (Signed)
Encounter addended by: Nicole Cella, RN on: 06/29/2019 2:52 PM  Actions taken: Clinical Note Signed

## 2019-06-30 ENCOUNTER — Other Ambulatory Visit (HOSPITAL_COMMUNITY)
Admission: RE | Admit: 2019-06-30 | Discharge: 2019-06-30 | Disposition: A | Discharge status: Home / Self Care | Payer: Medicaid Other | Source: Ambulatory Visit | Attending: Internal Medicine | Admitting: Internal Medicine

## 2019-06-30 DIAGNOSIS — Z01812 Encounter for preprocedural laboratory examination: Secondary | ICD-10-CM | POA: Insufficient documentation

## 2019-06-30 DIAGNOSIS — Z20822 Contact with and (suspected) exposure to covid-19: Secondary | ICD-10-CM | POA: Diagnosis not present

## 2019-06-30 LAB — SARS CORONAVIRUS 2 (TAT 6-24 HRS): SARS Coronavirus 2: NEGATIVE

## 2019-07-01 ENCOUNTER — Ambulatory Visit (HOSPITAL_COMMUNITY)
Admission: RE | Disposition: A | Discharge status: Home / Self Care | Payer: Self-pay | Source: Home / Self Care | Attending: Internal Medicine

## 2019-07-01 ENCOUNTER — Other Ambulatory Visit: Payer: Self-pay

## 2019-07-01 ENCOUNTER — Ambulatory Visit (HOSPITAL_COMMUNITY)
Admission: RE | Admit: 2019-07-01 | Discharge: 2019-07-01 | Disposition: A | Discharge status: Home / Self Care | Payer: Medicaid Other | Attending: Internal Medicine | Admitting: Internal Medicine

## 2019-07-01 DIAGNOSIS — Z9884 Bariatric surgery status: Secondary | ICD-10-CM | POA: Diagnosis not present

## 2019-07-01 DIAGNOSIS — I10 Essential (primary) hypertension: Secondary | ICD-10-CM | POA: Diagnosis not present

## 2019-07-01 DIAGNOSIS — R0602 Shortness of breath: Secondary | ICD-10-CM | POA: Diagnosis not present

## 2019-07-01 DIAGNOSIS — Z79899 Other long term (current) drug therapy: Secondary | ICD-10-CM | POA: Diagnosis not present

## 2019-07-01 DIAGNOSIS — M351 Other overlap syndromes: Secondary | ICD-10-CM

## 2019-07-01 DIAGNOSIS — M797 Fibromyalgia: Secondary | ICD-10-CM | POA: Diagnosis not present

## 2019-07-01 DIAGNOSIS — R0789 Other chest pain: Secondary | ICD-10-CM | POA: Insufficient documentation

## 2019-07-01 DIAGNOSIS — Z6838 Body mass index (BMI) 38.0-38.9, adult: Secondary | ICD-10-CM | POA: Insufficient documentation

## 2019-07-01 DIAGNOSIS — Z87891 Personal history of nicotine dependence: Secondary | ICD-10-CM | POA: Diagnosis not present

## 2019-07-01 DIAGNOSIS — M329 Systemic lupus erythematosus, unspecified: Secondary | ICD-10-CM | POA: Insufficient documentation

## 2019-07-01 DIAGNOSIS — F419 Anxiety disorder, unspecified: Secondary | ICD-10-CM | POA: Insufficient documentation

## 2019-07-01 HISTORY — PX: RIGHT/LEFT HEART CATH AND CORONARY ANGIOGRAPHY: CATH118266

## 2019-07-01 LAB — POCT I-STAT, CHEM 8
BUN: 11 mg/dL (ref 6–20)
Calcium, Ion: 1.12 mmol/L — ABNORMAL LOW (ref 1.15–1.40)
Chloride: 98 mmol/L (ref 98–111)
Creatinine, Ser: 0.5 mg/dL (ref 0.44–1.00)
Glucose, Bld: 93 mg/dL (ref 70–99)
HCT: 35 % — ABNORMAL LOW (ref 36.0–46.0)
Hemoglobin: 11.9 g/dL — ABNORMAL LOW (ref 12.0–15.0)
Potassium: 2.7 mmol/L — CL (ref 3.5–5.1)
Sodium: 139 mmol/L (ref 135–145)
TCO2: 33 mmol/L — ABNORMAL HIGH (ref 22–32)

## 2019-07-01 LAB — POCT I-STAT EG7
Acid-Base Excess: 6 mmol/L — ABNORMAL HIGH (ref 0.0–2.0)
Acid-Base Excess: 6 mmol/L — ABNORMAL HIGH (ref 0.0–2.0)
Bicarbonate: 31.6 mmol/L — ABNORMAL HIGH (ref 20.0–28.0)
Bicarbonate: 31.5 mmol/L — ABNORMAL HIGH (ref 20.0–28.0)
Calcium, Ion: 1.17 mmol/L (ref 1.15–1.40)
Calcium, Ion: 1.16 mmol/L (ref 1.15–1.40)
HCT: 37 % (ref 36.0–46.0)
HCT: 36 % (ref 36.0–46.0)
Hemoglobin: 12.6 g/dL (ref 12.0–15.0)
Hemoglobin: 12.2 g/dL (ref 12.0–15.0)
O2 Saturation: 78 %
O2 Saturation: 78 %
Potassium: 2.7 mmol/L — CL (ref 3.5–5.1)
Potassium: 2.7 mmol/L — CL (ref 3.5–5.1)
Sodium: 139 mmol/L (ref 135–145)
Sodium: 139 mmol/L (ref 135–145)
TCO2: 33 mmol/L — ABNORMAL HIGH (ref 22–32)
TCO2: 33 mmol/L — ABNORMAL HIGH (ref 22–32)
pCO2, Ven: 49 mmHg (ref 44.0–60.0)
pCO2, Ven: 48.6 mmHg (ref 44.0–60.0)
pH, Ven: 7.418 (ref 7.250–7.430)
pH, Ven: 7.42 (ref 7.250–7.430)
pO2, Ven: 42 mmHg (ref 32.0–45.0)
pO2, Ven: 42 mmHg (ref 32.0–45.0)

## 2019-07-01 LAB — POCT I-STAT 7, (LYTES, BLD GAS, ICA,H+H)
Acid-Base Excess: 6 mmol/L — ABNORMAL HIGH (ref 0.0–2.0)
Bicarbonate: 30.9 mmol/L — ABNORMAL HIGH (ref 20.0–28.0)
Calcium, Ion: 1.11 mmol/L — ABNORMAL LOW (ref 1.15–1.40)
HCT: 35 % — ABNORMAL LOW (ref 36.0–46.0)
Hemoglobin: 11.9 g/dL — ABNORMAL LOW (ref 12.0–15.0)
O2 Saturation: 99 %
Potassium: 2.8 mmol/L — ABNORMAL LOW (ref 3.5–5.1)
Sodium: 140 mmol/L (ref 135–145)
TCO2: 32 mmol/L (ref 22–32)
pCO2 arterial: 43.9 mmHg (ref 32.0–48.0)
pH, Arterial: 7.455 — ABNORMAL HIGH (ref 7.350–7.450)
pO2, Arterial: 156 mmHg — ABNORMAL HIGH (ref 83.0–108.0)

## 2019-07-01 LAB — POTASSIUM: Potassium: 2.8 mmol/L — ABNORMAL LOW (ref 3.5–5.1)

## 2019-07-01 SURGERY — RIGHT/LEFT HEART CATH AND CORONARY ANGIOGRAPHY
Anesthesia: LOCAL

## 2019-07-01 MED ORDER — MIDAZOLAM HCL 2 MG/2ML IJ SOLN
INTRAMUSCULAR | Status: DC | PRN
  Administered 2019-07-01: 1 mg via INTRAVENOUS
  Administered 2019-07-01: 2 mg via INTRAVENOUS

## 2019-07-01 MED ORDER — POTASSIUM CHLORIDE CRYS ER 20 MEQ PO TBCR
80.0000 meq | EXTENDED_RELEASE_TABLET | Freq: Once | ORAL | Status: AC
  Administered 2019-07-01: 80 meq via ORAL
  Filled 2019-07-01: qty 4

## 2019-07-01 MED ORDER — SODIUM CHLORIDE 0.9 % IV SOLN: 250.0000 mL | INTRAVENOUS | Status: DC | PRN

## 2019-07-01 MED ORDER — POTASSIUM CHLORIDE CRYS ER 20 MEQ PO TBCR
EXTENDED_RELEASE_TABLET | ORAL | Status: AC
  Filled 2019-07-01: qty 4

## 2019-07-01 MED ORDER — LIDOCAINE HCL (PF) 1 % IJ SOLN
INTRAMUSCULAR | Status: DC | PRN
  Administered 2019-07-01 (×2): 2 mL
  Administered 2019-07-01: 20 mL
  Administered 2019-07-01: 15 mL

## 2019-07-01 MED ORDER — ASPIRIN 81 MG PO CHEW
81.0000 mg | CHEWABLE_TABLET | ORAL | Status: AC
  Administered 2019-07-01: 12:00:00 81 mg via ORAL
  Filled 2019-07-01: qty 1

## 2019-07-01 MED ORDER — SODIUM CHLORIDE 0.9% FLUSH: 3.0000 mL | INTRAVENOUS | Status: DC | PRN

## 2019-07-01 MED ORDER — LIDOCAINE HCL (PF) 1 % IJ SOLN
INTRAMUSCULAR | Status: AC
  Filled 2019-07-01: qty 30

## 2019-07-01 MED ORDER — VERAPAMIL HCL 2.5 MG/ML IV SOLN
INTRAVENOUS | Status: DC | PRN
  Administered 2019-07-01: 10 mL via INTRA_ARTERIAL

## 2019-07-01 MED ORDER — FENTANYL CITRATE (PF) 100 MCG/2ML IJ SOLN
INTRAMUSCULAR | Status: DC | PRN
  Administered 2019-07-01 (×2): 25 ug via INTRAVENOUS

## 2019-07-01 MED ORDER — ONDANSETRON HCL 4 MG/2ML IJ SOLN: 4.0000 mg | Freq: Four times a day (QID) | INTRAMUSCULAR | Status: DC | PRN

## 2019-07-01 MED ORDER — SODIUM CHLORIDE 0.9% FLUSH: 3.0000 mL | Freq: Two times a day (BID) | INTRAVENOUS | Status: DC

## 2019-07-01 MED ORDER — SODIUM CHLORIDE 0.9 % IV SOLN: INTRAVENOUS | Status: DC

## 2019-07-01 MED ORDER — HYDRALAZINE HCL 20 MG/ML IJ SOLN: 10.0000 mg | INTRAMUSCULAR | Status: DC | PRN

## 2019-07-01 MED ORDER — IOHEXOL 350 MG/ML SOLN
INTRAVENOUS | Status: DC | PRN
  Administered 2019-07-01: 65 mL via INTRA_ARTERIAL

## 2019-07-01 MED ORDER — HEPARIN (PORCINE) IN NACL 1000-0.9 UT/500ML-% IV SOLN
INTRAVENOUS | Status: AC
  Filled 2019-07-01: qty 1500

## 2019-07-01 MED ORDER — FENTANYL CITRATE (PF) 100 MCG/2ML IJ SOLN
INTRAMUSCULAR | Status: AC
  Filled 2019-07-01: qty 2

## 2019-07-01 MED ORDER — VERAPAMIL HCL 2.5 MG/ML IV SOLN
INTRAVENOUS | Status: AC
  Filled 2019-07-01: qty 2

## 2019-07-01 MED ORDER — SODIUM CHLORIDE 0.9 % IV SOLN: INTRAVENOUS | Status: AC

## 2019-07-01 MED ORDER — MIDAZOLAM HCL 2 MG/2ML IJ SOLN
INTRAMUSCULAR | Status: AC
  Filled 2019-07-01: qty 2

## 2019-07-01 MED ORDER — LABETALOL HCL 5 MG/ML IV SOLN: 10.0000 mg | INTRAVENOUS | Status: DC | PRN

## 2019-07-01 MED ORDER — HEPARIN (PORCINE) IN NACL 1000-0.9 UT/500ML-% IV SOLN
INTRAVENOUS | Status: DC | PRN
  Administered 2019-07-01 (×3): 500 mL

## 2019-07-01 MED ORDER — ACETAMINOPHEN 325 MG PO TABS: 650.0000 mg | ORAL_TABLET | ORAL | Status: DC | PRN

## 2019-07-01 MED ORDER — HEPARIN SODIUM (PORCINE) 1000 UNIT/ML IJ SOLN
INTRAMUSCULAR | Status: AC
  Filled 2019-07-01: qty 1

## 2019-07-01 SURGICAL SUPPLY — 15 items
CATH 5FR JL3.5 JR4 ANG PIG MP (CATHETERS) ×2 IMPLANT
CATH BALLN WEDGE 5F 110CM (CATHETERS) ×2 IMPLANT
CATH SWAN GANZ 7F STRAIGHT (CATHETERS) ×2 IMPLANT
DEVICE RAD COMP TR BAND LRG (VASCULAR PRODUCTS) ×2 IMPLANT
GLIDESHEATH SLEND SS 6F .021 (SHEATH) ×2 IMPLANT
GUIDEWIRE .025 260CM (WIRE) ×2 IMPLANT
GUIDEWIRE INQWIRE 1.5J.035X260 (WIRE) ×1 IMPLANT
INQWIRE 1.5J .035X260CM (WIRE) ×2
KIT HEART LEFT (KITS) ×2 IMPLANT
KIT MICROPUNCTURE NIT STIFF (SHEATH) ×2 IMPLANT
PACK CARDIAC CATHETERIZATION (CUSTOM PROCEDURE TRAY) ×2 IMPLANT
SHEATH GLIDE SLENDER 4/5FR (SHEATH) ×2 IMPLANT
SHEATH PINNACLE 7F 10CM (SHEATH) ×2 IMPLANT
SHEATH PROBE COVER 6X72 (BAG) ×2 IMPLANT
TRANSDUCER W/STOPCOCK (MISCELLANEOUS) ×2 IMPLANT

## 2019-07-01 NOTE — Progress Notes (Signed)
Site area: right femoral vein 26fr Site Prior to Removal:  level 0 Pressure Applied For:  Manual: yes Patient Status During Pull:  stable  Post Pull Site:  level 0 Post Pull Instructions Given:  yes Post Pull Pulses Present: DP +2 Dressing Applied: gauze and tegaderm Bedrest begins @ 1450 Comments: n/a

## 2019-07-01 NOTE — Interval H&P Note (Signed)
History and Physical Interval Note:  07/01/2019 12:05 PM  Holly Davidson  has presented today for surgery, with the diagnosis of Heart failure.  The various methods of treatment have been discussed with the patient and family. After consideration of risks, benefits and other options for treatment, the patient has consented to  Procedure(s): RIGHT/LEFT HEART CATH AND CORONARY ANGIOGRAPHY (N/A) and possible coronary angioplasty as a surgical intervention.  The patient's history has been reviewed, patient examined, no change in status, stable for surgery.  I have reviewed the patient's chart and labs.  Questions were answered to the patient's satisfaction.     Jahred Tatar

## 2019-07-01 NOTE — Discharge Instructions (Signed)
Radial Site Care  This sheet gives you information about how to care for yourself after your procedure. Your health care provider may also give you more specific instructions. If you have problems or questions, contact your health care provider. What can I expect after the procedure? After the procedure, it is common to have:  Bruising and tenderness at the catheter insertion area. Follow these instructions at home: Medicines  Take over-the-counter and prescription medicines only as told by your health care provider. Insertion site care  Follow instructions from your health care provider about how to take care of your insertion site. Make sure you: ? Wash your hands with soap and water before you change your bandage (dressing). If soap and water are not available, use hand sanitizer. ? Remove your dressing as told by your health care provider. In 24-48 hours  Check your insertion site every day for signs of infection. Check for: ? Redness, swelling, or pain. ? Fluid or blood. ? Pus or a bad smell. ? Warmth.  Do not take baths, swim, or use a hot tub until your health care provider approves.  You may shower 24-48 hours after the procedure, or as directed by your health care provider. ? Remove the dressing and gently wash the site with plain soap and water. ? Pat the area dry with a clean towel. ? Do not rub the site. That could cause bleeding.  Do not apply powder or lotion to the site. Activity   For 24 hours after the procedure, or as directed by your health care provider: ? Do not flex or bend the affected arm. ? Do not push or pull heavy objects with the affected arm. ? Do not drive yourself home from the hospital or clinic. You may drive 24 hours after the procedure unless your health care provider tells you not to. ? Do not operate machinery or power tools.  Do not lift anything that is heavier than 10 lb (4.5 kg), or the limit that you are told, until your health care  provider says that it is safe. For 4 days  Ask your health care provider when it is okay to: ? Return to work or school. ? Resume usual physical activities or sports. ? Resume sexual activity. General instructions  If the catheter site starts to bleed, raise your arm and put firm pressure on the site. If the bleeding does not stop, get help right away. This is a medical emergency.  If you went home on the same day as your procedure, a responsible adult should be with you for the first 24 hours after you arrive home.  Keep all follow-up visits as told by your health care provider. This is important. Contact a health care provider if:  You have a fever.  You have redness, swelling, or yellow drainage around your insertion site. Get help right away if:  You have unusual pain at the radial site.  The catheter insertion area swells very fast.  The insertion area is bleeding, and the bleeding does not stop when you hold steady pressure on the area.  Your arm or hand becomes pale, cool, tingly, or numb. These symptoms may represent a serious problem that is an emergency. Do not wait to see if the symptoms will go away. Get medical help right away. Call your local emergency services (911 in the [place] Do not drive yourself to the hospital. Summary  After the procedure, it is common to have bruising and tenderness at the  site.  Follow instructions from your health care provider about how to take care of your radial site wound. Check the wound every day for signs of infection.  Do not lift anything that is heavier than 10 lb (4.5 kg), or the limit that you are told, until your health care provider says that it is safe. This information is not intended to replace advice given to you by your health care provider. Make sure you discuss any questions you have with your health care provider. Document Revised: 06/10/2017 Document Reviewed: 06/10/2017 Elsevier Patient Education  2020 [place].  Femoral Site Care This sheet gives you information about how to care for yourself after your procedure. Your health care provider may also give you more specific instructions. If you have problems or questions, contact your health care provider. What can I expect after the procedure? After the procedure, it is common to have:  Bruising that usually fades within 1-2 weeks.  Tenderness at the site. Follow these instructions at home: Wound care  Follow instructions from your health care provider about how to take care of your insertion site. Make sure you: ? Wash your hands with soap and water before you change your bandage (dressing). If soap and water are not available, use hand sanitizer. ? Remove your dressing as told by your health care provider. In 24 hours  Do not take baths, swim, or use a hot tub until your health care provider approves.  You may shower 24-48 hours after the procedure or as told by your health care provider. ? Gently wash the site with plain soap and water. ? Pat the area dry with a clean towel. ? Do not rub the site. This may cause bleeding.  Do not apply powder or lotion to the site. Keep the site clean and dry.  Check your femoral site every day for signs of infection. Check for: ? Redness, swelling, or pain. ? Fluid or blood. ? Warmth. ? Pus or a bad smell. Activity  For the first 2-3 days after your procedure, or as long as directed: ? Avoid climbing stairs as much as possible. ? Do not squat.  Do not lift anything that is heavier than 10 lb (4.5 kg), or the limit that you are told, until your health care provider says that it is safe.  Rest as directed. ? Avoid sitting for a long time without moving. Get up to take short walks every 1-2 hours.  Do not drive for 24 hours if you were given a medicine to help you relax (sedative). General instructions  Take over-the-counter and prescription medicines only as told by your health care  provider.  Keep all follow-up visits as told by your health care provider. This is important. Contact a health care provider if you have:  A fever or chills.  You have redness, swelling, or pain around your insertion site. Get help right away if:  The catheter insertion area swells very fast.  You pass out.  You suddenly start to sweat or your skin gets clammy.  The catheter insertion area is bleeding, and the bleeding does not stop when you hold steady pressure on the area.  The area near or just beyond the catheter insertion site becomes pale, cool, tingly, or numb. These symptoms may represent a serious problem that is an emergency. Do not wait to see if the symptoms will go away. Get medical help right away. Call your local emergency services (911 in the [place] Do not  drive yourself to the hospital. Summary  After the procedure, it is common to have bruising that usually fades within 1-2 weeks.  Check your femoral site every day for signs of infection.  Do not lift anything that is heavier than 10 lb (4.5 kg), or the limit that you are told, until your health care provider says that it is safe. This information is not intended to replace advice given to you by your health care provider. Make sure you discuss any questions you have with your health care provider. Document Revised: 05/18/2017 Document Reviewed: 05/18/2017 Elsevier Patient Education  2020 [employer].

## 2019-07-04 MED FILL — Heparin Sodium (Porcine) Inj 1000 Unit/ML: INTRAMUSCULAR | Qty: 10 | Status: AC

## 2019-07-13 ENCOUNTER — Telehealth (HOSPITAL_COMMUNITY): Payer: Self-pay | Admitting: Cardiology

## 2019-07-13 DIAGNOSIS — I240 Acute coronary thrombosis not resulting in myocardial infarction: Secondary | ICD-10-CM

## 2019-07-13 NOTE — Telephone Encounter (Signed)
Patient left message on triage line checking the status of CT that was ordered after her cath  Per Dr Gala Romney  Medical therapy. CP does not appear cardiac. The RCA was cannulated with both the JR4 and JL3.5 but seem to get in from different cusps (? forked ostia). Will get coronary CT to evaluate.   Holly Meres, MD  2:22 PM   Message sent to Holly Davidson will require pre cert. Once pre cert obtained will reach out to patient to schedule. Attempted to contact patient to inform her of the process. No answer and mail box full.

## 2019-07-18 ENCOUNTER — Telehealth (HOSPITAL_COMMUNITY): Payer: Self-pay | Admitting: *Deleted

## 2019-07-18 ENCOUNTER — Other Ambulatory Visit (HOSPITAL_COMMUNITY): Payer: Self-pay | Admitting: *Deleted

## 2019-07-18 MED ORDER — POTASSIUM CHLORIDE CRYS ER 20 MEQ PO TBCR: 40.0000 meq | EXTENDED_RELEASE_TABLET | Freq: Two times a day (BID) | ORAL | 3 refills | Status: DC

## 2019-07-18 NOTE — Telephone Encounter (Signed)
Called pt to schedule lab visit and advise. Pt did not answer/ vm full. Will try patient again later.

## 2019-07-18 NOTE — Telephone Encounter (Signed)
Pt called stating her potassium was increased on 2/12 when she had her cath and she is out medication and needs a refill. I asked patient if she was taking twice daily she said Dr.Bensimhon increased it to bid. I do not see any documentation of the change I told patient I would follow up with Dr.Bensimhon and send the correct dose to her pharmacy.  Routed to Dr.Bensimhon for advice

## 2019-07-18 NOTE — Telephone Encounter (Signed)
What dose was she taking before? Whatever it was let's double that. Recheck on Friday.

## 2019-07-20 NOTE — Telephone Encounter (Signed)
Ct scheduled for 07/29/19

## 2019-08-05 ENCOUNTER — Telehealth (HOSPITAL_COMMUNITY): Payer: Self-pay | Admitting: Emergency Medicine

## 2019-08-05 DIAGNOSIS — R0789 Other chest pain: Secondary | ICD-10-CM

## 2019-08-05 MED ORDER — METOPROLOL TARTRATE 100 MG PO TABS: ORAL_TABLET | ORAL | 0 refills | Status: DC

## 2019-08-05 NOTE — Telephone Encounter (Signed)
Thank you :)

## 2019-08-05 NOTE — Telephone Encounter (Signed)
Reaching out to patient to offer assistance regarding upcoming cardiac imaging study; pt verbalizes understanding of appt date/time, parking situation and where to check in, pre-test NPO status and medications ordered, and verified current allergies; name and call back number provided for further questions should they arise Rockwell Alexandria RN Navigator Cardiac Imaging [facility] [phone] office [phone] cell   Pt states she was not given any medication for her HR for cardiac CT.  I confirmed her pharm and will send in 100mg  metoprolol tartrate to take 2 hr prior to scan. Pt verbalized understanding. Denies further questions.  

## 2019-08-08 ENCOUNTER — Encounter (HOSPITAL_COMMUNITY): Payer: Self-pay

## 2019-08-08 ENCOUNTER — Ambulatory Visit (HOSPITAL_COMMUNITY)
Admission: RE | Admit: 2019-08-08 | Discharge: 2019-08-08 | Disposition: A | Discharge status: Home / Self Care | Payer: Medicaid Other | Source: Ambulatory Visit | Attending: Internal Medicine | Admitting: Internal Medicine

## 2019-08-08 ENCOUNTER — Other Ambulatory Visit: Payer: Self-pay

## 2019-08-08 ENCOUNTER — Ambulatory Visit (HOSPITAL_COMMUNITY): Payer: Medicaid Other

## 2019-08-08 DIAGNOSIS — I240 Acute coronary thrombosis not resulting in myocardial infarction: Secondary | ICD-10-CM

## 2019-08-08 MED ORDER — IOHEXOL 350 MG/ML SOLN
90.0000 mL | Freq: Once | INTRAVENOUS | Status: AC | PRN
  Administered 2019-08-08: 90 mL via INTRAVENOUS

## 2019-08-08 MED ORDER — NITROGLYCERIN 0.4 MG SL SUBL: 0.8000 mg | SUBLINGUAL_TABLET | Freq: Once | SUBLINGUAL | Status: AC

## 2019-08-08 MED ORDER — NITROGLYCERIN 0.4 MG SL SUBL
SUBLINGUAL_TABLET | SUBLINGUAL | Status: AC
  Administered 2019-08-08: 0.8 mg via SUBLINGUAL
  Filled 2019-08-08: qty 2

## 2019-08-25 ENCOUNTER — Encounter: Payer: Medicaid Other | Admitting: Psychology

## 2019-08-25 NOTE — Progress Notes (Deleted)
Office Visit Note  Patient: Holly Davidson             Date of Birth: Sep 28, 1965           MRN: [id number]             PCP: Tamsen Snider, NP Referring: Tamsen Snider, NP Visit Date: 08/30/2019 Occupation: @GUAROCC @  Subjective:  No chief complaint on file.   History of Present Illness: Holly Davidson is a 54 y.o. female ***   Activities of Daily Living:  Patient reports morning stiffness for *** {minute/hour:19697}.   Patient {ACTIONS;DENIES/REPORTS:21021675::"Denies"} nocturnal pain.  Difficulty dressing/grooming: {ACTIONS;DENIES/REPORTS:21021675::"Denies"} Difficulty climbing stairs: {ACTIONS;DENIES/REPORTS:21021675::"Denies"} Difficulty getting out of chair: {ACTIONS;DENIES/REPORTS:21021675::"Denies"} Difficulty using hands for taps, buttons, cutlery, and/or writing: {ACTIONS;DENIES/REPORTS:21021675::"Denies"}  No Rheumatology ROS completed.   PMFS History:  Patient Active Problem List   Diagnosis Date Noted  . Other fatigue 12/18/2016  . Primary insomnia 12/18/2016  . Vitamin D deficiency 12/18/2016  . Former smoker 12/18/2016  . History of cholecystectomy 12/18/2016  . History of gastric bypass 12/18/2016  . Osteoarthritis of both hands 03/28/2016  . 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 MCTD (mixed connective tissue disease) (HCC) + ANA, + Ro, + RNP 03/28/2016  . Osteoarthritis of both knees 03/28/2016  . DDD (degenerative disc disease), lumbar 03/28/2016  . Fibromyalgia 03/28/2016  . Morbid obesity, Initial weight - 282, BMI - 42.7 09/09/2012    Past Medical History:  Diagnosis Date  . Anemia   . Anxiety   . Arthritis   . Back pain    "OFF & ON"  . 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 DDD (degenerative disc disease), lumbar 03/28/2016  . Difficulty sleeping    occasional - takes med prn  . Fibromyalgia   . Herniated lumbar intervertebral disc   . Hypertension   . Lupus (HCC)    "presents as joint pain, occ. rash   . Lupus arthritis (HCC)   . MCTD (mixed connective tissue disease) (HCC) 03/28/2016   + ANA, + Ro, + RNP  . Osteoarthritis of both hands 03/28/2016  . Osteoarthritis of both knees 03/28/2016   Moderate     Family History  Problem Relation Age of Onset  . Pulmonary embolism Mother   . Cancer Father        colon  . COPD Other   . Hypertension Other   . Hyperlipidemia Other   . Stroke Other   . Diabetes Other   . Cancer Other   . Heart disease Other   . Rheum arthritis Sister   . Autoimmune disease Daughter   . Graves' disease Son    Past Surgical History:  Procedure Laterality Date  . ABDOMINAL HYSTERECTOMY    . BREATH TEK H PYLORI N/A 09/14/2013   Procedure: BREATH TEK H PYLORI;  Surgeon: 09/16/2013, MD;  Location: [facility];  Service: General;  Laterality: N/A;  . CESAREAN SECTION    . GASTRIC ROUX-EN-Y N/A 03/14/2014   Procedure: LAPAROSCOPIC ROUX-EN-Y GASTRIC BYPASS WITH UPPER ENDOSCOPY;  Surgeon: 03/16/2014, MD;  Location: [facility];  Service: General;  Laterality: N/A;  . right foot surgery    . RIGHT/LEFT HEART CATH AND CORONARY ANGIOGRAPHY N/A 07/01/2019   Procedure: RIGHT/LEFT HEART CATH AND CORONARY ANGIOGRAPHY;  Surgeon: 08/29/2019, MD;  Location: [facility];  Service: Cardiovascular;  Laterality: N/A;  . TUBAL LIGATION     Social History   Social History Narrative  Right handed      Highest level of edu- Assoc      Has a 18 year old son. Lives in 2 story home   Immunization History  Administered Date(s) Administered  . Pneumococcal Polysaccharide-23 04/08/2018  . Zoster Recombinat (Shingrix) 04/07/2018, 07/28/2018     Objective: Vital Signs: There were no vitals taken for this visit.   Physical Exam   Musculoskeletal Exam: ***  CDAI Exam: CDAI Score: -- Patient Global: --; Provider Global: -- Swollen: --; Tender: -- Joint Exam 08/30/2019   No joint exam has been documented for this visit   There is currently no  information documented on the homunculus. [name] and complete the homunculus joint exam.  Investigation: No additional findings.  Imaging: CT CORONARY MORPH W/CTA COR W/SCORE W/CA W/CM &/OR WO/CM  Addendum Date: 08/08/2019   ADDENDUM REPORT: 08/08/2019 17:48 CLINICAL DATA:  Chest pain EXAM: Cardiac CTA MEDICATIONS: Sub lingual nitro. 4mg  x 2 TECHNIQUE: The patient was scanned on a Siemens 284 slice scanner. Gantry rotation speed was 250 msecs. Collimation was 0.6 mm. A 100 kV prospective scan was triggered in the ascending thoracic aorta at 35-75% of the R-R interval. Average HR during the scan was 60 bpm. The 3D data set was interpreted on a dedicated work station using MPR, MIP and VRT modes. A total of 80cc of contrast was used. FINDINGS: Non-cardiac: See separate report from [facility]. Pulmonary veins drain normally to the left atrium. Calcium Score: 2.75 Agatston units. Coronary Arteries: Right dominant with no anomalies LM: No plaque or stenosis. LAD system: Mixed plaque proximal LAD with minimal stenosis. Circumflex system: Small vessel, no plaque or stenosis. RCA system: Distal RCA affected by motion artifact, but I do not see any plaque or stenosis in the RCA. IMPRESSION: 1. Coronary artery calcium score 2.75 Agatston units. This places the patient in the 79th percentile for age and gender, suggesting intermediate to high risk for future cardiac events. 2.  No obstructive coronary disease. Dalton Mclean Electronically Signed   By: Loralie Champagne M.D.   On: 08/08/2019 17:48   Result Date: 08/08/2019 EXAM: OVER-READ INTERPRETATION  CT CHEST The following report is an over-read performed by radiologist Dr. Aletta Edouard of [facility], [place] on 08/08/2019. This over-read does not include interpretation of cardiac or coronary anatomy or pathology. The coronary CTA interpretation by the cardiologist is attached. COMPARISON:  CTA of the chest on 05/21/2014 at  [place]: Vascular: No incidental findings. Mediastinum/Nodes: Visualized mediastinum and hilar regions demonstrate no evidence of lymphadenopathy or masses. Lungs/Pleura: Visualized lungs show no evidence of pulmonary edema, consolidation, pneumothorax, nodule or pleural fluid. Upper Abdomen: No acute abnormality. Musculoskeletal: No chest wall mass or suspicious bone lesions identified. IMPRESSION: No significant incidental findings. Electronically Signed: By: Aletta Edouard M.D. On: 08/08/2019 14:55    Recent Labs: Lab Results  Component Value Date   WBC 8.1 06/29/2019   HGB 12.6 07/01/2019   HGB 12.2 07/01/2019   PLT 409 (H) 06/29/2019   NA 139 07/01/2019   NA 139 07/01/2019   K 2.7 (LL) 07/01/2019   K 2.7 (LL) 07/01/2019   CL 98 07/01/2019   CO2 29 06/29/2019   GLUCOSE 93 07/01/2019   BUN 11 07/01/2019   CREATININE 0.50 07/01/2019   BILITOT 0.5 04/04/2019   ALKPHOS 90 04/21/2018   AST 16 04/04/2019   ALT 12 04/04/2019   PROT 7.5 04/04/2019   PROT 7.5 04/04/2019   ALBUMIN 4.0  04/21/2018   CALCIUM 9.1 06/29/2019   GFRAA >60 06/29/2019    Speciality Comments: No specialty comments available.  Procedures:  No procedures performed Allergies: Patient has no known allergies.   Assessment / Plan:     Visit Diagnoses: No diagnosis found.  Orders: No orders of the defined types were placed in this encounter.  No orders of the defined types were placed in this encounter.   Face-to-face time spent with patient was *** minutes. Greater than 50% of time was spent in counseling and coordination of care.  Follow-Up Instructions: No follow-ups on file.   Gearldine Bienenstock, PA-C  Note - This record has been created using Dragon software.  Chart creation errors have been sought, but may not always  have been located. Such creation errors do not reflect on  the standard of medical care.

## 2019-08-29 ENCOUNTER — Telehealth: Payer: Self-pay | Admitting: Rheumatology

## 2019-08-29 NOTE — Telephone Encounter (Signed)
Patient called to reschedule her appointment that was scheduled for Tuesday, 08/30/19 to a different day.  While on the phone, patient stated she has been experiencing what she thinks is a "strange stomach bug."  Patient states for the past week she has had dizziness, diarrhea, and upset stomach.  Patient states the only thing she is able to keep down is a little soup.  Patient states she "has no appetite at all."  Patient states she had a COVID test on 07/01/19 which was negative.  Patient states she is planning to call her PCP this morning.  Please advise.

## 2019-08-29 NOTE — Telephone Encounter (Signed)
Patient advised to follow up with primary care physician for evaluation.

## 2019-08-30 ENCOUNTER — Telehealth: Payer: Self-pay | Admitting: *Deleted

## 2019-08-30 ENCOUNTER — Ambulatory Visit: Payer: Medicaid Other | Admitting: Rheumatology

## 2019-08-30 MED ORDER — PILOCARPINE HCL 5 MG PO TABS: ORAL_TABLET | ORAL | 2 refills | Status: DC

## 2019-08-30 NOTE — Telephone Encounter (Signed)
Please schedule patient for a follow up visit. Patient due April 2021. Thanks!  

## 2019-08-30 NOTE — Telephone Encounter (Signed)
Patient was scheduled for follow-up appointment on 08/30/19 which she cancelled yesterday due to having a "stomach bug."   Patient states she will call back to reschedule once she is feeling better.

## 2019-08-30 NOTE — Telephone Encounter (Signed)
Refill request received via fax  Last Visit: 04/04/19 Next Visit: due April 2021. Message sent to the front to schedule.   Okay to refill per Dr. Corliss Skains

## 2019-09-01 ENCOUNTER — Encounter: Payer: Medicaid Other | Admitting: Psychology

## 2019-11-01 ENCOUNTER — Other Ambulatory Visit: Payer: Self-pay | Admitting: *Deleted

## 2019-11-01 MED ORDER — AZATHIOPRINE 50 MG PO TABS: 100.0000 mg | ORAL_TABLET | Freq: Every day | ORAL | 0 refills | Status: DC

## 2019-11-01 NOTE — Telephone Encounter (Signed)
Ok to refill 30 day supply of imuran until she has updated lab work.

## 2019-11-01 NOTE — Telephone Encounter (Addendum)
Refill request received via fax  Last Visit: 04/04/19 Next Visit: 11/08/2019.  Labs: 06/29/2019 BMP- Glucose 106, Potassium 3.3, RBC 5.33, MCV 76.4, MCH 24.4, RDW 16.5 Platelets 409   Current Dose per office note on 04/04/2019: Imuran 50 mg 2 tablets daily  Patient to update labs at upcoming appointment on 11/08/2019  Okay to refill Imuran?

## 2019-11-04 ENCOUNTER — Ambulatory Visit: Payer: Medicaid Other | Admitting: Neurology

## 2019-11-08 ENCOUNTER — Ambulatory Visit: Payer: Medicaid Other | Admitting: Physician Assistant

## 2019-11-09 NOTE — Progress Notes (Deleted)
Office Visit Note  Patient: Holly Davidson             Date of Birth: 1965/10/05           MRN: [id number]             PCP: Clancy Gourd, NP Referring: Clancy Gourd, NP Visit Date: 11/10/2019 Occupation: @GUAROCC @  Subjective:  No chief complaint on file.   History of Present Illness: Holly Davidson is a 54 y.o. female ***   Activities of Daily Living:  Patient reports morning stiffness for *** {minute/hour:19697}.   Patient {ACTIONS;DENIES/REPORTS:21021675::"Denies"} nocturnal pain.  Difficulty dressing/grooming: {ACTIONS;DENIES/REPORTS:21021675::"Denies"} Difficulty climbing stairs: {ACTIONS;DENIES/REPORTS:21021675::"Denies"} Difficulty getting out of chair: {ACTIONS;DENIES/REPORTS:21021675::"Denies"} Difficulty using hands for taps, buttons, cutlery, and/or writing: {ACTIONS;DENIES/REPORTS:21021675::"Denies"}  No Rheumatology ROS completed.   PMFS History:  Patient Active Problem List   Diagnosis Date Noted  . Other fatigue 12/18/2016  . Primary insomnia 12/18/2016  . Vitamin D deficiency 12/18/2016  . Former smoker 12/18/2016  . History of cholecystectomy 12/18/2016  . History of gastric bypass 12/18/2016  . Osteoarthritis of both hands 03/28/2016  . 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 MCTD (mixed connective tissue disease) ([place]) + ANA, + Ro, + RNP 03/28/2016  . Osteoarthritis of both knees 03/28/2016  . DDD (degenerative disc disease), lumbar 03/28/2016  . Fibromyalgia 03/28/2016  . Morbid obesity, Initial weight - 282, BMI - 42.7 09/09/2012    Past Medical History:  Diagnosis Date  . Anemia   . Anxiety   . Arthritis   . Back pain    "OFF & ON"  . 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 DDD (degenerative disc disease), lumbar 03/28/2016  . Difficulty sleeping    occasional - takes med prn  . Fibromyalgia   . Herniated lumbar intervertebral disc   . Hypertension   . Lupus ([place])    "presents as joint pain, occ. rash   . Lupus arthritis ([place])   . MCTD (mixed connective tissue disease) ([place]) 03/28/2016   + ANA, + Ro, + RNP  . Osteoarthritis of both hands 03/28/2016  . Osteoarthritis of both knees 03/28/2016   Moderate     Family History  Problem Relation Age of Onset  . Pulmonary embolism Mother   . Cancer Father        colon  . COPD Other   . Hypertension Other   . Hyperlipidemia Other   . Stroke Other   . Diabetes Other   . Cancer Other   . Heart disease Other   . Rheum arthritis Sister   . Autoimmune disease Daughter   . Josephmichael Lisenbee' disease Son    Past Surgical History:  Procedure Laterality Date  . ABDOMINAL HYSTERECTOMY    . BREATH TEK H PYLORI N/A 09/14/2013   Procedure: BREATH TEK H PYLORI;  Surgeon: Shann Medal, MD;  Location: [facility];  Service: General;  Laterality: N/A;  . CESAREAN SECTION    . GASTRIC ROUX-EN-Y N/A 03/14/2014   Procedure: LAPAROSCOPIC ROUX-EN-Y GASTRIC BYPASS WITH UPPER ENDOSCOPY;  Surgeon: Alphonsa Overall, MD;  Location: [facility];  Service: General;  Laterality: N/A;  . right foot surgery    . RIGHT/LEFT HEART CATH AND CORONARY ANGIOGRAPHY N/A 07/01/2019   Procedure: RIGHT/LEFT HEART CATH AND CORONARY ANGIOGRAPHY;  Surgeon: Jolaine Artist, MD;  Location: [facility];  Service: Cardiovascular;  Laterality: N/A;  . TUBAL LIGATION     Social History   Social History Narrative  Right handed      Highest level of edu- Assoc      Has a 48 year old son. Lives in 2 story home   Immunization History  Administered Date(s) Administered  . Pneumococcal Polysaccharide-23 04/08/2018  . Zoster Recombinat (Shingrix) 04/07/2018, 07/28/2018     Objective: Vital Signs: There were no vitals taken for this visit.   Physical Exam   Musculoskeletal Exam: ***  CDAI Exam: CDAI Score: -- Patient Global: --; Provider Global: -- Swollen: --; Tender: -- Joint Exam 11/10/2019   No joint exam has been documented for this visit   There is currently no  information documented on the homunculus. [name] and complete the homunculus joint exam.  Investigation: No additional findings.  Imaging: No results found.  Recent Labs: Lab Results  Component Value Date   WBC 8.1 06/29/2019   HGB 12.6 07/01/2019   HGB 12.2 07/01/2019   PLT 409 (H) 06/29/2019   NA 139 07/01/2019   NA 139 07/01/2019   K 2.7 (LL) 07/01/2019   K 2.7 (LL) 07/01/2019   CL 98 07/01/2019   CO2 29 06/29/2019   GLUCOSE 93 07/01/2019   BUN 11 07/01/2019   CREATININE 0.50 07/01/2019   BILITOT 0.5 04/04/2019   ALKPHOS 90 04/21/2018   AST 16 04/04/2019   ALT 12 04/04/2019   PROT 7.5 04/04/2019   PROT 7.5 04/04/2019   ALBUMIN 4.0 04/21/2018   CALCIUM 9.1 06/29/2019   GFRAA >60 06/29/2019    Speciality Comments: No specialty comments available.  Procedures:  No procedures performed Allergies: Patient has no known allergies.   Assessment / Plan:     Visit Diagnoses: No diagnosis found.  Orders: No orders of the defined types were placed in this encounter.  No orders of the defined types were placed in this encounter.   Face-to-face time spent with patient was *** minutes. Greater than 50% of time was spent in counseling and coordination of care.  Follow-Up Instructions: No follow-ups on file.   Ellen Henri, CMA  Note - This record has been created using Animal nutritionist.  Chart creation errors have been sought, but may not always  have been located. Such creation errors do not reflect on  the standard of medical care.

## 2019-11-10 ENCOUNTER — Ambulatory Visit: Payer: Medicaid Other | Admitting: Rheumatology

## 2019-11-11 ENCOUNTER — Ambulatory Visit: Payer: Medicaid Other | Admitting: Physician Assistant

## 2019-11-14 ENCOUNTER — Other Ambulatory Visit (HOSPITAL_COMMUNITY): Payer: Self-pay

## 2019-11-14 MED ORDER — POTASSIUM CHLORIDE CRYS ER 20 MEQ PO TBCR: 40.0000 meq | EXTENDED_RELEASE_TABLET | Freq: Two times a day (BID) | ORAL | 3 refills | Status: DC

## 2019-12-07 NOTE — Progress Notes (Signed)
Office Visit Note  Patient: Holly Davidson             Date of Birth: May 31, 1965           MRN: [id number]             PCP: Tamsen Snider, NP Referring: Tamsen Snider, NP Visit Date: 12/13/2019 Occupation: @GUAROCC @  Subjective:  Left trochanteric bursitis   History of Present Illness: Holly Davidson is a 54 y.o. female with history of mixed connective tissue disease, fibromyalgia, and osteoarthritis.  She is taking Imuran 50 mg 2 tablets daily.  She has ongoing sicca symptoms. She takes pilocarpine 5 mg TID and uses OTC products for symptomatic relief. She has intermittent oral ulcerations but no nasal ulcerations.  She has not had any symptoms of raynaud's recently.  She denies any recent rashes.   She is followed closely by Dr. 57.  She continues to have chronic chest wall pain.  She had a coronary CT, echo, and cardiac cath several months ago.    She reports increased pain in both feet but denies any joint swelling. She has discomfort due to trochanteric bursitis of the left hip.  She has intermittent myalgias due to fibromyalgia.  She has an upcoming GI appointment on 12/22/19.  She has been experiencing nausea and loss of appetite intermittently over the past several months. She has also been experiencing dizziness intermittently.  She has received both covid-19 vaccines in May 2021.    Activities of Daily Living:  Patient reports morning stiffness for 1  hour.   Patient Reports nocturnal pain.  Difficulty dressing/grooming: Denies Difficulty climbing stairs: Reports Difficulty getting out of chair: Reports Difficulty using hands for taps, buttons, cutlery, and/or writing: Reports  Review of Systems  Constitutional: Positive for fatigue.  HENT: Positive for mouth dryness and nose dryness. Negative for mouth sores.   Eyes: Positive for dryness. Negative for pain, itching and visual disturbance.  Respiratory: Negative for cough, hemoptysis, shortness of breath  and difficulty breathing.   Cardiovascular: Negative for chest pain, palpitations, hypertension and swelling in legs/feet.  Gastrointestinal: Positive for constipation and nausea. Negative for blood in stool, diarrhea and vomiting.  Endocrine: Negative for increased urination.  Genitourinary: Negative for difficulty urinating and painful urination.  Musculoskeletal: Positive for arthralgias, joint pain, joint swelling, myalgias, muscle weakness, morning stiffness, muscle tenderness and myalgias.  Skin: Positive for rash. Negative for color change, pallor, hair loss, nodules/bumps, skin tightness, ulcers and sensitivity to sunlight.  Allergic/Immunologic: Negative for susceptible to infections.  Neurological: Positive for dizziness and headaches.  Hematological: Positive for bruising/bleeding tendency. Negative for swollen glands.  Psychiatric/Behavioral: Negative for depressed mood and sleep disturbance. The patient is not nervous/anxious.     PMFS History:  Patient Active Problem List   Diagnosis Date Noted  . Other fatigue 12/18/2016  . Primary insomnia 12/18/2016  . Vitamin D deficiency 12/18/2016  . Former smoker 12/18/2016  . History of cholecystectomy 12/18/2016  . History of gastric bypass 12/18/2016  . Osteoarthritis of both hands 03/28/2016  . 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 MCTD (mixed connective tissue disease) ([facility]) + ANA, + Ro, + RNP 03/28/2016  . Osteoarthritis of both knees 03/28/2016  . DDD (degenerative disc disease), lumbar 03/28/2016  . Fibromyalgia 03/28/2016  . Morbid obesity, Initial weight - 282, BMI - 42.7 09/09/2012    Past Medical History:  Diagnosis Date  . Anemia   . Anxiety   . Arthritis   .  Back pain    "OFF & ON"  . 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 DDD (degenerative disc disease), lumbar 03/28/2016  . Difficulty sleeping    occasional - takes med prn  . Dizziness    per patient   . Fibromyalgia   . Herniated  lumbar intervertebral disc   . Hypertension   . Lupus ([facility])    "presents as joint pain, occ. rash  . Lupus arthritis ([facility])   . MCTD (mixed connective tissue disease) ([facility]) 03/28/2016   + ANA, + Ro, + RNP  . Osteoarthritis of both hands 03/28/2016  . Osteoarthritis of both knees 03/28/2016   Moderate     Family History  Problem Relation Age of Onset  . Pulmonary embolism Mother   . Cancer Father        colon  . COPD Other   . Hypertension Other   . Hyperlipidemia Other   . Stroke Other   . Diabetes Other   . Cancer Other   . Heart disease Other   . Rheum arthritis Sister   . Autoimmune disease Daughter   . Graves' disease Son    Past Surgical History:  Procedure Laterality Date  . ABDOMINAL HYSTERECTOMY    . BREATH TEK H PYLORI N/A 09/14/2013   Procedure: BREATH TEK H PYLORI;  Surgeon: Kandis Cocking, MD;  Location: [facility];  Service: General;  Laterality: N/A;  . CESAREAN SECTION    . GASTRIC ROUX-EN-Y N/A 03/14/2014   Procedure: LAPAROSCOPIC ROUX-EN-Y GASTRIC BYPASS WITH UPPER ENDOSCOPY;  Surgeon: Ovidio Kin, MD;  Location: [facility];  Service: General;  Laterality: N/A;  . right foot surgery    . RIGHT/LEFT HEART CATH AND CORONARY ANGIOGRAPHY N/A 07/01/2019   Procedure: RIGHT/LEFT HEART CATH AND CORONARY ANGIOGRAPHY;  Surgeon: Dolores Patty, MD;  Location: [facility];  Service: Cardiovascular;  Laterality: N/A;  . TUBAL LIGATION     Social History   Social History Narrative   Right handed      Highest level of edu- Assoc      Has a 62 year old son. Lives in 2 story home   Immunization History  Administered Date(s) Administered  . Pneumococcal Polysaccharide-23 04/08/2018  . Zoster Recombinat (Shingrix) 04/07/2018, 07/28/2018     Objective: Vital Signs: BP (!) 130/88 (BP Location: Left Arm, Patient Position: Sitting, Cuff Size: Normal)   Pulse 72   Resp 17   Ht 5\' 6"  (1.676 m)   Wt (!) 244 lb 3.2 oz (110.8 kg)   BMI 39.41 kg/m     Physical Exam Vitals and nursing note reviewed.  Constitutional:      Appearance: She is well-developed.  HENT:     Head: Normocephalic and atraumatic.  Eyes:     Conjunctiva/sclera: Conjunctivae normal.  Pulmonary:     Effort: Pulmonary effort is normal.  Abdominal:     General: Bowel sounds are normal.     Palpations: Abdomen is soft.  Musculoskeletal:     Cervical back: Normal range of motion.  Lymphadenopathy:     Cervical: No cervical adenopathy.  Skin:    General: Skin is warm and dry.     Capillary Refill: Capillary refill takes less than 2 seconds.  Neurological:     Mental Status: She is alert and oriented to person, place, and time.  Psychiatric:        Behavior: Behavior normal.      Musculoskeletal Exam: C-spine, thoracic spine, and  lumbar spine good ROM.  Shoulder joints, elbow joints, wrist joints, MCPs, PIPs, and DIPs good ROM with no synovitis.  PIP and DIP thickening consistent with osteoarthritis of both hands. Complete fist formation bilaterally.  Hip joints, knee joints, ankle joints, MTPs, PIPs, and DIPs good ROM with no synovitis.  No warmth or effusion of knee joints.  No tenderness or swelling of ankle joints.   CDAI Exam: CDAI Score: -- Patient Global: --; Provider Global: -- Swollen: --; Tender: -- Joint Exam 12/13/2019   No joint exam has been documented for this visit   There is currently no information documented on the homunculus. [name] and complete the homunculus joint exam.  Investigation: No additional findings.  Imaging: No results found.  Recent Labs: Lab Results  Component Value Date   WBC 8.1 06/29/2019   HGB 12.6 07/01/2019   HGB 12.2 07/01/2019   PLT 409 (H) 06/29/2019   NA 139 07/01/2019   NA 139 07/01/2019   K 2.7 (LL) 07/01/2019   K 2.7 (LL) 07/01/2019   CL 98 07/01/2019   CO2 29 06/29/2019   GLUCOSE 93 07/01/2019   BUN 11 07/01/2019   CREATININE 0.50 07/01/2019   BILITOT 0.5  04/04/2019   ALKPHOS 90 04/21/2018   AST 16 04/04/2019   ALT 12 04/04/2019   PROT 7.5 04/04/2019   PROT 7.5 04/04/2019   ALBUMIN 4.0 04/21/2018   CALCIUM 9.1 06/29/2019   GFRAA >60 06/29/2019    Speciality Comments: No specialty comments available.  Procedures:  No procedures performed Allergies: Patient has no known allergies.   Assessment / Plan:     Visit Diagnoses: MCTD (mixed connective tissue disease) ([facility]) + ANA, + Ro, + RNP - She has not had any signs or symptoms of a flare recently.  She is clinically doing well on Imuran 50 mg 2 tablets daily.  Lab work from 04/04/2019 was reviewed today in the office.  We will recheck autoimmune labs today.  She continues to eventually myalgias and and arthralgias due to underlying fibromyalgia and osteoarthritis.  She has no synovitis on examination today.  She has not had any recent rashes.  No malar rash was noted on exam.  She was encouraged to wear sunscreen SPF greater than 50 and avoid direct sun exposure.  She has not had any symptoms of Raynaud's and no digital ulcerations or signs of gangrene were noted.  She continues to have chronic sicca symptoms.  She takes pilocarpine 5 mg 3 times daily as needed and uses over-the-counter products for symptomatic relief.  She has not had any recent oral or nasal ulcerations.  She has intermittent shortness of breath and continues to have chronic chest wall pain.  She was referred to Dr. Gala RomneyBensimhon in the past and has had a coronary CT, echocardiogram, and cardiac catheterization.  She was advised to schedule an appointment Dr. Gala RomneyBensimhon to further discuss the symptoms she is experiencing.  She will continue taking Imuran as prescribed.  A refill was sent to the pharmacy today.  We will update CBC and CMP while she is here.  She was advised to notify us if she develops any new or worsening symptoms.  She will follow-up in the office in 5 months plan: CBC with Differential/Platelet, COMPLETE METABOLIC PANEL  WITH GFR, Anti-DNA antibody, double-stranded, C3 and C4, Sedimentation rate, ANA, RNP Antibody, Sjogrens syndrome-A extractable nuclear antibody, Urinalysis, Routine w reflex microscopic  High risk medication use - Imuran 50 mg 2 tablets  daily.  CBC and CMP were drawn on 04/04/2019.  She is due to update lab work today.  Order for CBC and CMP were released.  She was advised to hold Imuran if develop signs or symptoms of an infection and to resume once the infection is cleared.  She has received both COVID-19 vaccinations in May 2021.  She was encouraged to continue to wear a mask and follow standard precautions.  She was advised to notify us prior to receiving the COVID-19 booster for further recommendations at that time.- Plan: CBC with Differential/Platelet, COMPLETE METABOLIC PANEL WITH GFR  Primary osteoarthritis of both hands: She has PIP and DIP thickening consistent with osteoarthritis of both hands.  No tenderness or synovitis was noted.  She has complete fist formation bilaterally.  Joint protection and muscle strengthening were discussed.  Primary osteoarthritis of both knees: She has good range of motion of both knee joints on exam.  No warmth or effusion was noted.  She uses a cane to assist with ambulation.  Chondromalacia of both patellae  Fibromyalgia: She experiences intermittent myalgias and muscle tenderness due to underlying fibromyalgia.  She has chronic fatigue secondary to insomnia.  She presents today with trochanter bursitis of the left hip.  She was given a handout of exercises to perform.  She takes Lyrica 200 mg 3 times daily.  She is no longer taking Flexeril.  We discussed the importance of regular exercise and good sleep hygiene.  Other fatigue: She has chronic fatigue secondary to insomnia.  Primary insomnia: Good sleep hygiene was discussed.  Trochanteric bursitis, left hip: She has intermittent discomfort due to trochanteric bursitis of the left hip.  She has  tenderness to palpation on exam today.  No groin pain and good ROM of both hip joints noted on exam. She has been using a cane to assist with ambulation. She was given a handout of exercises to perform.   DDD (degenerative disc disease), lumbar: She is not having any lower back pain at this time.  She has no symptoms of radiculopathy.   Nausea: She has been experiencing nausea intermittently as well as loss of appetite.  She has an upcoming appointment with GI on 12/22/2019 for further evaluation and treatment.  Pain in both feet: She has osteoarthritic changes in both feet.  She has noticed overcrowding of her toes and is having increased discomfort. We discussed the importance of wearing proper fitting shoes and using toe spacers. She requested a referral to podiatry for further evaluation and management.   Other medical conditions are listed as follows:   History of hypertension  History of gastric bypass  History of cholecystectomy  Vitamin D deficiency  History of depression  History of anxiety  History of anemia  Former smoker  Orders: Orders Placed This Encounter  Procedures  . CBC with Differential/Platelet  . COMPLETE METABOLIC PANEL WITH GFR  . Anti-DNA antibody, double-stranded  . C3 and C4  . Sedimentation rate  . ANA  . RNP Antibody  . Sjogrens syndrome-A extractable nuclear antibody  . Urinalysis, Routine w reflex microscopic  . Ambulatory referral to Podiatry   Meds ordered this encounter  Medications  . pilocarpine (SALAGEN) 5 MG tablet    Sig: TAKE 1 TABLET(5 MG) BY MOUTH THREE TIMES DAILY AS NEEDED    Dispense:  90 tablet    Refill:  2  . azaTHIOprine (IMURAN) 50 MG tablet    Sig: Take 2 tablets (100 mg total) by mouth daily.  Dispense:  180 tablet    Refill:  0     Follow-Up Instructions: Return in about 5 months (around 05/14/2020) for MCTD.   Pollyann Savoy, MD   Scribed by-  Sherron Ales, PA-C  Note - This record has been created  using Dragon software.  Chart creation errors have been sought, but may not always  have been located. Such creation errors do not reflect on  the standard of medical care.

## 2019-12-13 ENCOUNTER — Telehealth (HOSPITAL_COMMUNITY): Payer: Self-pay | Admitting: *Deleted

## 2019-12-13 ENCOUNTER — Encounter: Payer: Self-pay | Admitting: Rheumatology

## 2019-12-13 ENCOUNTER — Other Ambulatory Visit: Payer: Self-pay

## 2019-12-13 ENCOUNTER — Ambulatory Visit: Payer: Medicaid Other | Admitting: Rheumatology

## 2019-12-13 VITALS — BP 130/88 | HR 72 | Resp 17 | Ht 66.0 in | Wt 244.2 lb

## 2019-12-13 DIAGNOSIS — M797 Fibromyalgia: Secondary | ICD-10-CM

## 2019-12-13 DIAGNOSIS — Z79899 Other long term (current) drug therapy: Secondary | ICD-10-CM | POA: Diagnosis not present

## 2019-12-13 DIAGNOSIS — M2241 Chondromalacia patellae, right knee: Secondary | ICD-10-CM

## 2019-12-13 DIAGNOSIS — M19042 Primary osteoarthritis, left hand: Secondary | ICD-10-CM

## 2019-12-13 DIAGNOSIS — M351 Other overlap syndromes: Secondary | ICD-10-CM

## 2019-12-13 DIAGNOSIS — M5136 Other intervertebral disc degeneration, lumbar region: Secondary | ICD-10-CM

## 2019-12-13 DIAGNOSIS — Z862 Personal history of diseases of the blood and blood-forming organs and certain disorders involving the immune mechanism: Secondary | ICD-10-CM

## 2019-12-13 DIAGNOSIS — M51369 Other intervertebral disc degeneration, lumbar region without mention of lumbar back pain or lower extremity pain: Secondary | ICD-10-CM

## 2019-12-13 DIAGNOSIS — R110 Nausea: Secondary | ICD-10-CM

## 2019-12-13 DIAGNOSIS — E559 Vitamin D deficiency, unspecified: Secondary | ICD-10-CM

## 2019-12-13 DIAGNOSIS — F5101 Primary insomnia: Secondary | ICD-10-CM

## 2019-12-13 DIAGNOSIS — M170 Bilateral primary osteoarthritis of knee: Secondary | ICD-10-CM

## 2019-12-13 DIAGNOSIS — M79672 Pain in left foot: Secondary | ICD-10-CM

## 2019-12-13 DIAGNOSIS — M2242 Chondromalacia patellae, left knee: Secondary | ICD-10-CM

## 2019-12-13 DIAGNOSIS — Z87891 Personal history of nicotine dependence: Secondary | ICD-10-CM

## 2019-12-13 DIAGNOSIS — Z9884 Bariatric surgery status: Secondary | ICD-10-CM

## 2019-12-13 DIAGNOSIS — Z8679 Personal history of other diseases of the circulatory system: Secondary | ICD-10-CM

## 2019-12-13 DIAGNOSIS — M79671 Pain in right foot: Secondary | ICD-10-CM

## 2019-12-13 DIAGNOSIS — M19041 Primary osteoarthritis, right hand: Secondary | ICD-10-CM

## 2019-12-13 DIAGNOSIS — R5383 Other fatigue: Secondary | ICD-10-CM

## 2019-12-13 DIAGNOSIS — Z8659 Personal history of other mental and behavioral disorders: Secondary | ICD-10-CM

## 2019-12-13 DIAGNOSIS — Z9049 Acquired absence of other specified parts of digestive tract: Secondary | ICD-10-CM

## 2019-12-13 DIAGNOSIS — M7062 Trochanteric bursitis, left hip: Secondary | ICD-10-CM

## 2019-12-13 MED ORDER — PILOCARPINE HCL 5 MG PO TABS: ORAL_TABLET | ORAL | 2 refills | Status: DC

## 2019-12-13 MED ORDER — AZATHIOPRINE 50 MG PO TABS: 100.0000 mg | ORAL_TABLET | Freq: Every day | ORAL | 0 refills | Status: DC

## 2019-12-13 NOTE — Telephone Encounter (Signed)
Cath and CT very reassuring. Suspect this is musculoskeletal.

## 2019-12-13 NOTE — Patient Instructions (Signed)
Hip Bursitis Rehab Ask your health care provider which exercises are safe for you. Do exercises exactly as told by your health care provider and adjust them as directed. It is normal to feel mild stretching, pulling, tightness, or discomfort as you do these exercises. Stop right away if you feel sudden pain or your pain gets worse. Do not begin these exercises until told by your health care provider. Stretching exercise This exercise warms up your muscles and joints and improves the movement and flexibility of your hip. This exercise also helps to relieve pain and stiffness. Iliotibial band stretch An iliotibial band is a strong band of muscle tissue that runs from the outer side of your hip to the outer side of your thigh and knee. 1. Lie on your side with your left / right leg in the top position. 2. Bend your left / right knee and grab your ankle. Stretch out your bottom arm to help you balance. 3. Slowly bring your knee back so your thigh is behind your body. 4. Slowly lower your knee toward the floor until you feel a gentle stretch on the outside of your left / right thigh. If you do not feel a stretch and your knee will not fall farther, place the heel of your other foot on top of your knee and pull your knee down toward the floor with your foot. 5. Hold this position for __________ seconds. 6. Slowly return to the starting position. Repeat __________ times. Complete this exercise __________ times a day. Strengthening exercises These exercises build strength and endurance in your hip and pelvis. Endurance is the ability to use your muscles for a long time, even after they get tired. Bridge This exercise strengthens the muscles that move your thigh backward (hip extensors). 1. Lie on your back on a firm surface with your knees bent and your feet flat on the floor. 2. Tighten your buttocks muscles and lift your buttocks off the floor until your trunk is level with your thighs. ? Do not arch  your back. ? You should feel the muscles working in your buttocks and the back of your thighs. If you do not feel these muscles, slide your feet 1-2 inches (2.5-5 cm) farther away from your buttocks. ? If this exercise is too easy, try doing it with your arms crossed over your chest. 3. Hold this position for __________ seconds. 4. Slowly lower your hips to the starting position. 5. Let your muscles relax completely after each repetition. Repeat __________ times. Complete this exercise __________ times a day. Squats This exercise strengthens the muscles in front of your thigh and knee (quadriceps). 1. Stand in front of a table, with your feet and knees pointing straight ahead. You may rest your hands on the table for balance but not for support. 2. Slowly bend your knees and lower your hips like you are going to sit in a chair. ? Keep your weight over your heels, not over your toes. ? Keep your lower legs upright so they are parallel with the table legs. ? Do not let your hips go lower than your knees. ? Do not bend lower than told by your health care provider. ? If your hip pain increases, do not bend as low. 3. Hold the squat position for __________ seconds. 4. Slowly push with your legs to return to standing. Do not use your hands to pull yourself to standing. Repeat __________ times. Complete this exercise __________ times a day. Hip hike 1. Stand   sideways on a bottom step. Stand on your left / right leg with your other foot unsupported next to the step. You can hold on to the railing or wall for balance if needed. 2. Keep your knees straight and your torso square. Then lift your left / right hip up toward the ceiling. 3. Hold this position for __________ seconds. 4. Slowly let your left / right hip lower toward the floor, past the starting position. Your foot should get closer to the floor. Do not lean or bend your knees. Repeat __________ times. Complete this exercise __________ times a  day. Single leg stand 1. Without shoes, stand near a railing or in a doorway. You may hold on to the railing or door frame as needed for balance. 2. Squeeze your left / right buttock muscles, then lift up your other foot. ? Do not let your left / right hip push out to the side. ? It is helpful to stand in front of a mirror for this exercise so you can watch your hip. 3. Hold this position for __________ seconds. Repeat __________ times. Complete this exercise __________ times a day. This information is not intended to replace advice given to you by your health care provider. Make sure you discuss any questions you have with your health care provider. Document Revised: 08/30/2018 Document Reviewed: 08/30/2018 Elsevier Patient Education  2020 [employer]  

## 2019-12-13 NOTE — Telephone Encounter (Signed)
Pt called stating shes still having chest wall pain. Pt said she was told by Dr.Bensimhon he would find out what is causing her chest pain. Pt said her cath and ct were normal but want to know the next steps in finding out why she still has this pain. Pt is due back in February with an echo.   Routed to Dr.Bensimhon for advice

## 2019-12-14 NOTE — Progress Notes (Signed)
MCV, MCH, and MCHC mildly low.  CMP WNL.  ESR WNL.

## 2019-12-15 ENCOUNTER — Other Ambulatory Visit: Payer: Self-pay | Admitting: Physician Assistant

## 2019-12-17 LAB — COMPLETE METABOLIC PANEL WITH GFR
AG Ratio: 1.3 (calc) (ref 1.0–2.5)
ALT: 10 U/L (ref 6–29)
AST: 18 U/L (ref 10–35)
Albumin: 4 g/dL (ref 3.6–5.1)
Alkaline phosphatase (APISO): 73 U/L (ref 37–153)
BUN: 11 mg/dL (ref 7–25)
CO2: 30 mmol/L (ref 20–32)
Calcium: 9.3 mg/dL (ref 8.6–10.4)
Chloride: 100 mmol/L (ref 98–110)
Creat: 0.79 mg/dL (ref 0.50–1.05)
GFR, Est African American: 98 mL/min/{1.73_m2} (ref >=60)
GFR, Est Non African American: 85 mL/min/{1.73_m2} (ref >=60)
Globulin: 3 g/dL (calc) (ref 1.9–3.7)
Glucose, Bld: 81 mg/dL (ref 65–99)
Potassium: 4 mmol/L (ref 3.5–5.3)
Sodium: 139 mmol/L (ref 135–146)
Total Bilirubin: 0.6 mg/dL (ref 0.2–1.2)
Total Protein: 7 g/dL (ref 6.1–8.1)

## 2019-12-17 LAB — CBC WITH DIFFERENTIAL/PLATELET
Absolute Monocytes: 627 cells/uL (ref 200–950)
Basophils Absolute: 22 cells/uL (ref 0–200)
Basophils Relative: 0.4 %
Eosinophils Absolute: 151 cells/uL (ref 15–500)
Eosinophils Relative: 2.7 %
HCT: 37.4 % (ref 35.0–45.0)
Hemoglobin: 11.8 g/dL (ref 11.7–15.5)
Lymphs Abs: 1378 cells/uL (ref 850–3900)
MCH: 24.4 pg — ABNORMAL LOW (ref 27.0–33.0)
MCHC: 31.6 g/dL — ABNORMAL LOW (ref 32.0–36.0)
MCV: 77.3 fL — ABNORMAL LOW (ref 80.0–100.0)
MPV: 10.3 fL (ref 7.5–12.5)
Monocytes Relative: 11.2 %
Neutro Abs: 3422 cells/uL (ref 1500–7800)
Neutrophils Relative %: 61.1 %
Platelets: 362 10*3/uL (ref 140–400)
RBC: 4.84 10*6/uL (ref 3.80–5.10)
RDW: 16.9 % — ABNORMAL HIGH (ref 11.0–15.0)
Total Lymphocyte: 24.6 %
WBC: 5.6 10*3/uL (ref 3.8–10.8)

## 2019-12-17 LAB — URINALYSIS, ROUTINE W REFLEX MICROSCOPIC
Bacteria, UA: NONE SEEN /HPF
Bilirubin Urine: NEGATIVE
Glucose, UA: NEGATIVE
Hgb urine dipstick: NEGATIVE
Hyaline Cast: NONE SEEN /LPF
Ketones, ur: NEGATIVE
Nitrite: NEGATIVE
Protein, ur: NEGATIVE
RBC / HPF: NONE SEEN /HPF (ref 0–2)
Specific Gravity, Urine: 1.008 (ref 1.001–1.03)
pH: 6 (ref 5.0–8.0)

## 2019-12-17 LAB — RNP ANTIBODY: Ribonucleic Protein(ENA) Antibody, IgG: 2.1 AI — AB

## 2019-12-17 LAB — ANA: Anti Nuclear Antibody (ANA): POSITIVE — AB

## 2019-12-17 LAB — ANTI-NUCLEAR AB-TITER (ANA TITER)
ANA TITER: 1:1280 {titer} — ABNORMAL HIGH
ANA Titer 1: 1:80 {titer} — ABNORMAL HIGH

## 2019-12-17 LAB — SEDIMENTATION RATE: Sed Rate: 28 mm/h (ref 0–30)

## 2019-12-17 LAB — C3 AND C4
C3 Complement: 138 mg/dL (ref 83–193)
C4 Complement: 36 mg/dL (ref 15–57)

## 2019-12-17 LAB — ANTI-DNA ANTIBODY, DOUBLE-STRANDED: ds DNA Ab: 2 IU/mL

## 2019-12-17 LAB — SJOGRENS SYNDROME-A EXTRACTABLE NUCLEAR ANTIBODY: SSA (Ro) (ENA) Antibody, IgG: 4.4 AI — AB

## 2019-12-19 ENCOUNTER — Telehealth: Payer: Self-pay | Admitting: *Deleted

## 2019-12-19 NOTE — Progress Notes (Signed)
DsDNA negative. Complements WNL. ANA remains positive. RNP and Ro antibodies positive.  Labs are not consistent with a flare at this time.  No change in therapy recommended.  UA revealed 10-20 WBCs and 1+ leukocytes.  Negative nitrites.  Please notify the patient and advise her to follow up with PCP if she develops signs or symptoms of a UTI.

## 2019-12-19 NOTE — Telephone Encounter (Signed)
She can return to our office for a cortisone injection or follow up with her orthopedist.

## 2019-12-19 NOTE — Telephone Encounter (Signed)
Patient advised she can return to our office for a cortisone injection or follow up with her orthopedist. Patient states she will follow up with her orthopedist.

## 2019-12-19 NOTE — Telephone Encounter (Signed)
Patient states she has bursitis in her left hip and is now having pain in her right hip. Patient states it has been going on for a couple months but has gotten worse in the last 3 weeks. Patient has been doing stretches that were given at her last appointment. Patient states she has not given much relief. Patient states she would like to know if she should come here for an appointment or see orthopedics. Please advise.

## 2019-12-19 NOTE — Telephone Encounter (Signed)
Attempted to contact the patient and left message for patient to call the office.  

## 2019-12-21 ENCOUNTER — Ambulatory Visit (INDEPENDENT_AMBULATORY_CARE_PROVIDER_SITE_OTHER): Payer: Medicaid Other

## 2019-12-21 ENCOUNTER — Other Ambulatory Visit: Payer: Self-pay | Admitting: Sports Medicine

## 2019-12-21 ENCOUNTER — Ambulatory Visit (INDEPENDENT_AMBULATORY_CARE_PROVIDER_SITE_OTHER): Payer: Medicaid Other | Admitting: Sports Medicine

## 2019-12-21 ENCOUNTER — Encounter: Payer: Self-pay | Admitting: Sports Medicine

## 2019-12-21 ENCOUNTER — Other Ambulatory Visit: Payer: Self-pay

## 2019-12-21 DIAGNOSIS — M7752 Other enthesopathy of left foot: Secondary | ICD-10-CM | POA: Diagnosis not present

## 2019-12-21 DIAGNOSIS — M79671 Pain in right foot: Secondary | ICD-10-CM

## 2019-12-21 DIAGNOSIS — M779 Enthesopathy, unspecified: Secondary | ICD-10-CM

## 2019-12-21 DIAGNOSIS — M7751 Other enthesopathy of right foot: Secondary | ICD-10-CM | POA: Diagnosis not present

## 2019-12-21 DIAGNOSIS — M2042 Other hammer toe(s) (acquired), left foot: Secondary | ICD-10-CM

## 2019-12-21 DIAGNOSIS — M79672 Pain in left foot: Secondary | ICD-10-CM | POA: Diagnosis not present

## 2019-12-21 DIAGNOSIS — M2041 Other hammer toe(s) (acquired), right foot: Secondary | ICD-10-CM

## 2019-12-21 DIAGNOSIS — L905 Scar conditions and fibrosis of skin: Secondary | ICD-10-CM

## 2019-12-21 NOTE — Progress Notes (Signed)
Subjective: Holly Davidson is a 54 y.o. female patient who presents to office for evaluation of Right> Left foot pain. Patient complains of progressive pain especially over the last year in the Right>Left foot at the 2-5 toes with the 2nd and 3rd toes separating from each other. Ranks pain 4/10 on right no pain on left right now. Reports that she had previous surgery with the doctor had to take out the nerve in between her toes on the right foot and ever since she started to notice slowly that the second toe on the right started to separate from the third toe.  Patient also admits to occasional pain and swelling.  Has not tried any treatment.  No other pedal complaints noted.  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     antiseptic oral rinse (BIOTENE) LIQD 15 mLs by Mouth Rinse route as needed for dry mouth.     Artificial Saliva (BIOTENE ORALBALANCE DRY MOUTH) GEL Use as directed 1  application in the mouth or throat as needed (dry mouth).     azaTHIOprine (IMURAN) 50 MG tablet Take 2 tablets (100 mg total) by mouth daily. 180 tablet 0   Calcium Carb-Cholecalciferol (CALCIUM 600+D3 PO) Take 1 tablet by mouth 2 (two) times daily.     Camphor-Menthol-Methyl Sal (SALONPAS EX) Place 1 patch onto the skin daily as needed (pain.).     Cholecalciferol (VITAMIN D3 PO) Take 1 tablet by mouth 2 (two) times daily.     cyanocobalamin (,VITAMIN B-12,) 1000 MCG/ML injection Inject 1,000 mcg into the muscle every 7 (seven) days.     Dentifrices (BIOTENE DRY MOUTH GENTLE) PSTE Place 1 application onto teeth as needed (dry mouth).     furosemide (LASIX) 20 MG tablet Take 20 mg by mouth 2 (two) times daily.   0   hydrochlorothiazide (HYDRODIURIL) 25 MG tablet Take 25 mg by mouth daily.     hydrOXYzine (VISTARIL) 50 MG capsule Take 50-100 mg by mouth 3 (three) times daily as needed for anxiety.     MAGNESIUM PO Take 1 tablet by mouth 2 (two) times daily.     meclizine (ANTIVERT) 25 MG tablet Take 25 mg by mouth 3 (three) times daily as needed.     nitroGLYCERIN (NITROSTAT) 0.4 MG SL tablet Place 0.4 mg under the tongue every 5 (five) minutes x 3 doses as needed for chest pain.      ondansetron (ZOFRAN) 4 MG tablet Take 4 mg by mouth every  6 (six) hours as needed.     oxyCODONE (ROXICODONE) 15 MG immediate release tablet Take 15 mg by mouth 4 (four) times daily as needed for pain.     pilocarpine (SALAGEN) 5 MG tablet TAKE 1 TABLET(5 MG) BY MOUTH THREE TIMES DAILY AS NEEDED 90 tablet 2   polyvinyl alcohol (LIQUIFILM TEARS) 1.4 % ophthalmic solution Place 2 drops into both eyes 3 (three) times daily as needed for dry eyes.      potassium chloride SA (KLOR-CON) 20 MEQ tablet Take 2 tablets (40 mEq total) by mouth 2 (two) times daily. 120 tablet 3   pregabalin (LYRICA) 200 MG capsule Take 200 mg by mouth 3 (three) times daily.     SYMBICORT 160-4.5 MCG/ACT inhaler Inhale into the  lungs.     tiZANidine (ZANAFLEX) 2 MG tablet Take 1 mg by mouth 3 (three) times daily as needed.     traZODone (DESYREL) 150 MG tablet Take 300 mg by mouth at bedtime as needed for sleep.      No current facility-administered medications on file prior to visit.    No Known Allergies  Objective:  General: Alert and oriented x3 in no acute distress, history of fibromyalgia and lupus walks with cane  Dermatology: Old scarring noted at the first interspace on the right, no webspace macerations, no ecchymosis bilateral, all nails x 10 are well manicured.  Vascular: Dorsalis Pedis and Posterior Tibial pedal pulses 1/4, Capillary Fill Time 3 seconds,(+) pedal hair growth bilateral, no edema bilateral lower extremities, Temperature gradient within normal limits.  Neurology: Michaell Cowing sensation intact via light touch bilateral.  Musculoskeletal: Semi-flexible hammertoes 2-5 with minimal tenderness palpation at the proximal interphalangeal joint with mild deviation of the right second toe medially.  Pes planus foot type.  Strength within normal limits in all groups bilateral.   Gait: Cane assisted, Non-antalgic.  Xrays  Right/Left Foot    Impression: Normal osseous mineralization, K wire intact at first metatarsal on right, mild soft tissue swelling, mild lesser digital contracture.  No other acute findings.       Assessment and Plan: Problem List Items Addressed This Visit    None    Visit Diagnoses    Hammer toes of both feet    -  Primary   Scar tissue       Capsulitis       Right foot pain       Left foot pain           -Complete examination performed -Xrays reviewed -Discussed treatement options for scar tissue at first interspace that could be adding to her issue of toe deviation and hammertoe -Dispensed toe spacers and advised patient to use as instructed -Advised patient to wear good supportive shoes daily for foot type -May soak with Epson salt and use topical pain creams or  rubs as needed -Advised patient if symptoms fail to improve may benefit from steroid injection and as a last resort any surgery however since there is no pain on the left do not recommend any surgery at this time -Patient to return to office as needed or sooner if condition worsens.  Asencion Islam, DPM

## 2019-12-22 ENCOUNTER — Telehealth: Payer: Self-pay | Admitting: Rheumatology

## 2019-12-22 NOTE — Telephone Encounter (Signed)
Patient calling to let you know UTI symptoms are getting worse. Patient request rx to be called into [facility] in [place].

## 2019-12-22 NOTE — Telephone Encounter (Signed)
Patient advised she would need to see PCP or urgent care for UTI symptoms that are getting worse. Patient advised they should be the ones to evaluate and treat.

## 2019-12-27 ENCOUNTER — Other Ambulatory Visit: Payer: Self-pay | Admitting: Sports Medicine

## 2019-12-27 DIAGNOSIS — M779 Enthesopathy, unspecified: Secondary | ICD-10-CM

## 2020-02-18 ENCOUNTER — Other Ambulatory Visit (HOSPITAL_COMMUNITY): Payer: Self-pay | Admitting: Internal Medicine

## 2020-02-21 ENCOUNTER — Ambulatory Visit: Payer: Medicaid Other | Admitting: Sports Medicine

## 2020-03-30 ENCOUNTER — Other Ambulatory Visit: Payer: Self-pay | Admitting: Physician Assistant

## 2020-03-30 DIAGNOSIS — Z79899 Other long term (current) drug therapy: Secondary | ICD-10-CM

## 2020-03-30 NOTE — Telephone Encounter (Signed)
Last Visit: 12/13/2019 Next Visit: 05/22/2020 Labs: 12/13/2019 MCV, MCH, and MCHC mildly low. CMP WNL.   Current Dose per office note 12/13/2019: Imuran 50 mg 2 tablets daily DX: MCTD (mixed connective tissue disease) (HCC) + ANA, + Ro, + RNP  Patient advised she is due to update labs. Patient states she will update next week.   Okay to refill Imuran?

## 2020-04-27 ENCOUNTER — Other Ambulatory Visit: Payer: Self-pay | Admitting: Physician Assistant

## 2020-04-27 NOTE — Telephone Encounter (Addendum)
Last Visit: 12/13/2019 Next Visit: 05/22/2020 Labs: 12/13/2019 MCV, MCH, and MCHC mildly low. CMP WNL.   Current Dose per office note 12/13/2019: Imuran 50 mg 2 tablets daily DX: MCTD (mixed connective tissue disease) (HCC) + ANA, + Ro, + RNP  Patient advised she is due to update labs. Patient states she will come into the office today to update.   Okay to refill Imuran?

## 2020-05-04 ENCOUNTER — Ambulatory Visit: Payer: Medicaid Other | Admitting: Sports Medicine

## 2020-05-09 NOTE — Progress Notes (Signed)
Office Visit Note  Patient: Holly Davidson             Date of Birth: 10/19/1965           MRN: [id number]             PCP: Tamsen SniderFerrell, Kirstie B, NP Referring: Tamsen SniderFerrell, Kirstie B, NP Visit Date: 05/22/2020 Occupation: @GUAROCC @  Subjective:  Other (Left knee pain- patient report she is scheduled for an ultrasound )   History of Present Illness: Makia S Marina Goodellerry is a 54 y.o. female history of mixed connective tissue disease, osteoarthritis, fibromyalgia and degenerative disc disease.  She states she has been experiencing pain in her left knee joint.  She is scheduled to get an ultrasound of her left knee joint per her PCP.  She continues to have some left trochanteric bursitis.  There is no history of oral ulcers, nasal ulcers, malar rash, photosensitivity or Raynaud's phenomenon.  She has been tolerating Imuran well.  She continues to have sicca symptoms.  Her fibromyalgia is flaring as well which is causing generalized pain.  She continues to have lower back discomfort.  She states she has been taking Imuran on a regular basis.  Activities of Daily Living:  Patient reports morning stiffness for all day. Patient Reports nocturnal pain.  Difficulty dressing/grooming: Reports Difficulty climbing stairs: Reports Difficulty getting out of chair: Reports Difficulty using hands for taps, buttons, cutlery, and/or writing: Reports  Review of Systems  Constitutional: Positive for fatigue.  HENT: Positive for mouth dryness and nose dryness. Negative for mouth sores.   Eyes: Positive for dryness. Negative for pain and itching.  Respiratory: Negative for shortness of breath and difficulty breathing.   Cardiovascular: Negative for chest pain and palpitations.  Gastrointestinal: Positive for constipation. Negative for blood in stool and diarrhea.  Endocrine: Negative for increased urination.  Genitourinary: Negative for difficulty urinating.  Musculoskeletal: Positive for arthralgias, joint pain,  joint swelling, morning stiffness and muscle tenderness. Negative for myalgias, muscle weakness and myalgias.  Skin: Negative for color change, rash and redness.  Allergic/Immunologic: Negative for susceptible to infections.  Neurological: Positive for dizziness and memory loss. Negative for numbness, headaches and weakness.  Hematological: Positive for bruising/bleeding tendency.  Psychiatric/Behavioral: Negative for confusion.    PMFS History:  Patient Active Problem List   Diagnosis Date Noted  . Abnormal EKG 07/28/2017  . Lupus erythematosus 07/28/2017  . Chest pain in adult 07/17/2017  . Essential hypertension 07/17/2017  . History of fibromyalgia 07/17/2017  . Other fatigue 12/18/2016  . Primary insomnia 12/18/2016  . Vitamin D deficiency 12/18/2016  . Former smoker 12/18/2016  . History of cholecystectomy 12/18/2016  . History of gastric bypass 12/18/2016  . Osteoarthritis of both hands 03/28/2016  . 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 MCTD (mixed connective tissue disease) (HCC) + ANA, + Ro, + RNP 03/28/2016  . Osteoarthritis of both knees 03/28/2016  . DDD (degenerative disc disease), lumbar 03/28/2016  . Fibromyalgia 03/28/2016  . ANA positive 10/26/2013  . Morbid obesity, Initial weight - 282, BMI - 42.7 09/09/2012    Past Medical History:  Diagnosis Date  . Anemia   . Anxiety   . Arthritis   . Back pain    "OFF & ON"  . 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 DDD (degenerative disc disease), lumbar 03/28/2016  . Difficulty sleeping    occasional - takes med prn  . Dizziness    per patient   .  Fibromyalgia   . Herniated lumbar intervertebral disc   . Hypertension   . Lupus (HCC)    "presents as joint pain, occ. rash  . Lupus arthritis (HCC)   . MCTD (mixed connective tissue disease) (HCC) 03/28/2016   + ANA, + Ro, + RNP  . Osteoarthritis of both hands 03/28/2016  . Osteoarthritis of both knees 03/28/2016   Moderate     Family  History  Problem Relation Age of Onset  . Pulmonary embolism Mother   . Cancer Father        colon  . COPD Other   . Hypertension Other   . Hyperlipidemia Other   . Stroke Other   . Diabetes Other   . Cancer Other   . Heart disease Other   . Rheum arthritis Sister   . Autoimmune disease Daughter   . Graves' disease Son    Past Surgical History:  Procedure Laterality Date  . ABDOMINAL HYSTERECTOMY    . BREATH TEK H PYLORI N/A 09/14/2013   Procedure: BREATH TEK H PYLORI;  Surgeon: Kandis Cocking, MD;  Location: [facility];  Service: General;  Laterality: N/A;  . CESAREAN SECTION    . GASTRIC ROUX-EN-Y N/A 03/14/2014   Procedure: LAPAROSCOPIC ROUX-EN-Y GASTRIC BYPASS WITH UPPER ENDOSCOPY;  Surgeon: Ovidio Kin, MD;  Location: [facility];  Service: General;  Laterality: N/A;  . right foot surgery    . RIGHT/LEFT HEART CATH AND CORONARY ANGIOGRAPHY N/A 07/01/2019   Procedure: RIGHT/LEFT HEART CATH AND CORONARY ANGIOGRAPHY;  Surgeon: Dolores Patty, MD;  Location: [facility];  Service: Cardiovascular;  Laterality: N/A;  . TUBAL LIGATION     Social History   Social History Narrative   Right handed      Highest level of edu- Assoc      Has a 58 year old son. Lives in 2 story home   Immunization History  Administered Date(s) Administered  . PFIZER SARS-COV-2 Vaccination 10/01/2019, 10/22/2019, 03/19/2020  . Pneumococcal Polysaccharide-23 04/08/2018  . Zoster Recombinat (Shingrix) 04/07/2018, 07/28/2018     Objective: Vital Signs: BP 122/78 (BP Location: Right Arm, Patient Position: Sitting, Cuff Size: Normal)   Pulse 64   Resp 17   Ht 5\' 6"  (1.676 m)   Wt 246 lb 12.8 oz (111.9 kg)   BMI 39.83 kg/m    Physical Exam Vitals and nursing note reviewed.  Constitutional:      Appearance: She is well-developed and well-nourished.  HENT:     Head: Normocephalic and atraumatic.  Eyes:     Extraocular Movements: EOM normal.     Conjunctiva/sclera: Conjunctivae  normal.  Cardiovascular:     Rate and Rhythm: Normal rate and regular rhythm.     Pulses: Intact distal pulses.     Heart sounds: Normal heart sounds.  Pulmonary:     Effort: Pulmonary effort is normal.     Breath sounds: Normal breath sounds.  Abdominal:     General: Bowel sounds are normal.     Palpations: Abdomen is soft.  Musculoskeletal:     Cervical back: Normal range of motion.  Lymphadenopathy:     Cervical: No cervical adenopathy.  Skin:    General: Skin is warm and dry.     Capillary Refill: Capillary refill takes less than 2 seconds.  Neurological:     Mental Status: She is alert and oriented to person, place, and time.  Psychiatric:        Mood and Affect: Mood and affect normal.  Behavior: Behavior normal.      Musculoskeletal Exam: C-spine was in good range of motion.  She has limited painful range of motion of the lumbar spine.  Shoulder joints, elbow joints, wrist joints, MCPs PIPs and DIPs had no synovitis.  She has good range of motion of her hip joints.  She had discomfort range of motion of her left knee joint.  No effusion was noted.  CDAI Exam: CDAI Score: -- Patient Global: --; Provider Global: -- Swollen: --; Tender: -- Joint Exam 05/22/2020   No joint exam has been documented for this visit   There is currently no information documented on the homunculus. [name] and complete the homunculus joint exam.  Investigation: No additional findings.  Imaging: No results found.  Recent Labs: Lab Results  Component Value Date   WBC 5.6 12/13/2019   HGB 11.8 12/13/2019   PLT 362 12/13/2019   NA 139 12/13/2019   K 4.0 12/13/2019   CL 100 12/13/2019   CO2 30 12/13/2019   GLUCOSE 81 12/13/2019   BUN 11 12/13/2019   CREATININE 0.79 12/13/2019   BILITOT 0.6 12/13/2019   ALKPHOS 90 04/21/2018   AST 18 12/13/2019   ALT 10 12/13/2019   PROT 7.0 12/13/2019   ALBUMIN 4.0 04/21/2018   CALCIUM 9.3 12/13/2019   GFRAA 98  12/13/2019    Speciality Comments: No specialty comments available.  Procedures:  No procedures performed Allergies: Patient has no known allergies.   Assessment / Plan:     Visit Diagnoses: MCTD (mixed connective tissue disease) (HCC) + ANA, + Ro, + RNP -she complains of sicca symptoms.  There is no synovitis on examination.  There is no history of oral ulcers, nasal ulcers, malar rash, Raynaud's phenomenon.  Plan: Urinalysis, Routine w reflex microscopic, Sedimentation rate, C3 and C4, Anti-DNA antibody, double-stranded  High risk medication use - Imuran 50 mg 2 tablets daily.  Her labs are past due.  The need for getting labs on a regular basis was emphasized.  We will get labs today and then every 3 months to monitor for drug toxicity.- Plan: CBC with Differential/Platelet, COMPLETE METABOLIC PANEL WITH GFR.  Patient is fully vaccinated against COVID-19.  Use of mask, social distancing and hand hygiene was discussed.  I have also advised her to watch for next booster when available per CDC guidelines.  Primary osteoarthritis of both hands-joint protection was discussed.  Primary osteoarthritis of both knees-she has osteoarthritis in her knee joints.  She is scheduled to have ultrasound of her left knee joint by her PCP.  Chondromalacia of both patellae-weight loss diet and exercise was emphasized.  Fibromyalgia-need for regular exercise was discussed.  Primary insomnia-good sleep hygiene was discussed.  Other fatigue  Trochanteric bursitis, left hip-stretching exercises were discussed.  DDD (degenerative disc disease), lumbar-she continues to have some lower back pain.  She states she has seen orthopedics in the past without much help.  Weight loss will be helpful.  Other medical problems are listed as follows:  History of hypertension  History of cholecystectomy  History of gastric bypass  History of depression  Vitamin D deficiency  History of anxiety  History of  anemia  Former smoker  Orders: Orders Placed This Encounter  Procedures  . CBC with Differential/Platelet  . COMPLETE METABOLIC PANEL WITH GFR  . Urinalysis, Routine w reflex microscopic  . Sedimentation rate  . C3 and C4  . Anti-DNA antibody, double-stranded   No orders of the defined  types were placed in this encounter.  .  Follow-Up Instructions: Return in about 3 months (around 08/20/2020) for MCTD, Osteoarthritis.   Pollyann Savoy, MD  Note - This record has been created using Animal nutritionist.  Chart creation errors have been sought, but may not always  have been located. Such creation errors do not reflect on  the standard of medical care.

## 2020-05-16 ENCOUNTER — Other Ambulatory Visit: Payer: Self-pay | Admitting: Physician Assistant

## 2020-05-16 NOTE — Telephone Encounter (Signed)
Last Visit:12/13/2019 Next Visit:05/22/2020  Okay to refill per Dr. Corliss Skains

## 2020-05-22 ENCOUNTER — Encounter: Payer: Self-pay | Admitting: Rheumatology

## 2020-05-22 ENCOUNTER — Other Ambulatory Visit: Payer: Self-pay

## 2020-05-22 ENCOUNTER — Ambulatory Visit: Payer: Medicaid Other | Admitting: Rheumatology

## 2020-05-22 VITALS — BP 122/78 | HR 64 | Resp 17 | Ht 66.0 in | Wt 246.8 lb

## 2020-05-22 DIAGNOSIS — M351 Other overlap syndromes: Secondary | ICD-10-CM | POA: Diagnosis not present

## 2020-05-22 DIAGNOSIS — M2242 Chondromalacia patellae, left knee: Secondary | ICD-10-CM

## 2020-05-22 DIAGNOSIS — R5383 Other fatigue: Secondary | ICD-10-CM

## 2020-05-22 DIAGNOSIS — E559 Vitamin D deficiency, unspecified: Secondary | ICD-10-CM

## 2020-05-22 DIAGNOSIS — M5136 Other intervertebral disc degeneration, lumbar region: Secondary | ICD-10-CM

## 2020-05-22 DIAGNOSIS — Z8679 Personal history of other diseases of the circulatory system: Secondary | ICD-10-CM

## 2020-05-22 DIAGNOSIS — Z87891 Personal history of nicotine dependence: Secondary | ICD-10-CM

## 2020-05-22 DIAGNOSIS — Z79899 Other long term (current) drug therapy: Secondary | ICD-10-CM | POA: Diagnosis not present

## 2020-05-22 DIAGNOSIS — M51369 Other intervertebral disc degeneration, lumbar region without mention of lumbar back pain or lower extremity pain: Secondary | ICD-10-CM

## 2020-05-22 DIAGNOSIS — M19041 Primary osteoarthritis, right hand: Secondary | ICD-10-CM

## 2020-05-22 DIAGNOSIS — M170 Bilateral primary osteoarthritis of knee: Secondary | ICD-10-CM | POA: Diagnosis not present

## 2020-05-22 DIAGNOSIS — M797 Fibromyalgia: Secondary | ICD-10-CM

## 2020-05-22 DIAGNOSIS — M7062 Trochanteric bursitis, left hip: Secondary | ICD-10-CM

## 2020-05-22 DIAGNOSIS — M2241 Chondromalacia patellae, right knee: Secondary | ICD-10-CM

## 2020-05-22 DIAGNOSIS — Z9049 Acquired absence of other specified parts of digestive tract: Secondary | ICD-10-CM

## 2020-05-22 DIAGNOSIS — Z8659 Personal history of other mental and behavioral disorders: Secondary | ICD-10-CM

## 2020-05-22 DIAGNOSIS — M19042 Primary osteoarthritis, left hand: Secondary | ICD-10-CM

## 2020-05-22 DIAGNOSIS — Z9884 Bariatric surgery status: Secondary | ICD-10-CM

## 2020-05-22 DIAGNOSIS — F5101 Primary insomnia: Secondary | ICD-10-CM

## 2020-05-22 DIAGNOSIS — Z862 Personal history of diseases of the blood and blood-forming organs and certain disorders involving the immune mechanism: Secondary | ICD-10-CM

## 2020-05-23 LAB — C3 AND C4
C3 Complement: 148 mg/dL (ref 83–193)
C4 Complement: 40 mg/dL (ref 15–57)

## 2020-05-23 LAB — COMPLETE METABOLIC PANEL WITH GFR
AG Ratio: 1.3 (calc) (ref 1.0–2.5)
ALT: 9 U/L (ref 6–29)
AST: 18 U/L (ref 10–35)
Albumin: 4.4 g/dL (ref 3.6–5.1)
Alkaline phosphatase (APISO): 80 U/L (ref 37–153)
BUN: 20 mg/dL (ref 7–25)
CO2: 33 mmol/L — ABNORMAL HIGH (ref 20–32)
Calcium: 9.7 mg/dL (ref 8.6–10.4)
Chloride: 97 mmol/L — ABNORMAL LOW (ref 98–110)
Creat: 0.89 mg/dL (ref 0.50–1.05)
GFR, Est African American: 85 mL/min/{1.73_m2} (ref >=60)
GFR, Est Non African American: 73 mL/min/{1.73_m2} (ref >=60)
Globulin: 3.3 g/dL (calc) (ref 1.9–3.7)
Glucose, Bld: 80 mg/dL (ref 65–99)
Potassium: 3.6 mmol/L (ref 3.5–5.3)
Sodium: 138 mmol/L (ref 135–146)
Total Bilirubin: 0.5 mg/dL (ref 0.2–1.2)
Total Protein: 7.7 g/dL (ref 6.1–8.1)

## 2020-05-23 LAB — CBC WITH DIFFERENTIAL/PLATELET
Absolute Monocytes: 640 cells/uL (ref 200–950)
Basophils Absolute: 40 cells/uL (ref 0–200)
Basophils Relative: 0.8 %
Eosinophils Absolute: 260 cells/uL (ref 15–500)
Eosinophils Relative: 5.2 %
HCT: 40.7 % (ref 35.0–45.0)
Hemoglobin: 13.5 g/dL (ref 11.7–15.5)
Lymphs Abs: 1955 cells/uL (ref 850–3900)
MCH: 25.4 pg — ABNORMAL LOW (ref 27.0–33.0)
MCHC: 33.2 g/dL (ref 32.0–36.0)
MCV: 76.5 fL — ABNORMAL LOW (ref 80.0–100.0)
MPV: 10.1 fL (ref 7.5–12.5)
Monocytes Relative: 12.8 %
Neutro Abs: 2105 cells/uL (ref 1500–7800)
Neutrophils Relative %: 42.1 %
Platelets: 353 10*3/uL (ref 140–400)
RBC: 5.32 10*6/uL — ABNORMAL HIGH (ref 3.80–5.10)
RDW: 15.5 % — ABNORMAL HIGH (ref 11.0–15.0)
Total Lymphocyte: 39.1 %
WBC: 5 10*3/uL (ref 3.8–10.8)

## 2020-05-23 LAB — URINALYSIS, ROUTINE W REFLEX MICROSCOPIC
Bilirubin Urine: NEGATIVE
Glucose, UA: NEGATIVE
Hgb urine dipstick: NEGATIVE
Hyaline Cast: NONE SEEN /LPF
Nitrite: NEGATIVE
Protein, ur: NEGATIVE
Specific Gravity, Urine: 1.031 (ref 1.001–1.03)
pH: 5.5 (ref 5.0–8.0)

## 2020-05-23 LAB — SEDIMENTATION RATE: Sed Rate: 28 mm/h (ref 0–30)

## 2020-05-23 LAB — ANTI-DNA ANTIBODY, DOUBLE-STRANDED: ds DNA Ab: 1 IU/mL

## 2020-05-23 NOTE — Progress Notes (Signed)
UA shows trace leukocytes and a few bacteria probably contaminated.  CBC and CMP are normal.  Complements are normal.  Sed rate is normal.  Double-stranded DNA is pending.

## 2020-05-23 NOTE — Progress Notes (Signed)
Double-stranded DNA is normal.

## 2020-06-03 ENCOUNTER — Other Ambulatory Visit: Payer: Self-pay | Admitting: Rheumatology

## 2020-07-01 ENCOUNTER — Other Ambulatory Visit: Payer: Self-pay | Admitting: Rheumatology

## 2020-07-02 NOTE — Telephone Encounter (Signed)
Last Visit: 05/22/2020 Next Visit: 08/22/2020 Labs: 05/22/2020 CBC and CMP are normal.  Current Dose per office note 05/22/2020: Imuran 50 mg 2 tablets daily DX: MCTD (mixed connective tissue disease) (HCC) + ANA, + Ro, + RNP   Last Fill: 06/04/2020 (30 day supply)  Okay to refill per Dr. Corliss Skains

## 2020-08-08 NOTE — Progress Notes (Deleted)
Office Visit Note  Patient: Holly Davidson             Date of Birth: 10/12/1965           MRN: [id number]             PCP: Clancy Gourd, NP Referring: Clancy Gourd, NP Visit Date: 08/22/2020 Occupation: @GUAROCC @  Subjective:    History of Present Illness: Holly Davidson is a 55 y.o. female with history of mixed connective tissue disease, fibromyalgia, and osteoarthritis. She is taking imuran 50 mg 2 tablets by mouth daily.    Lab work from 05/22/20 was reviewed today in the office: dsDNA negative, ESR WNL, and complements WNL.     Activities of Daily Living:  Patient reports morning stiffness for *** {minute/hour:19697}.   Patient {ACTIONS;DENIES/REPORTS:21021675::"Denies"} nocturnal pain.  Difficulty dressing/grooming: {ACTIONS;DENIES/REPORTS:21021675::"Denies"} Difficulty climbing stairs: {ACTIONS;DENIES/REPORTS:21021675::"Denies"} Difficulty getting out of chair: {ACTIONS;DENIES/REPORTS:21021675::"Denies"} Difficulty using hands for taps, buttons, cutlery, and/or writing: {ACTIONS;DENIES/REPORTS:21021675::"Denies"}  No Rheumatology ROS completed.   PMFS History:  Patient Active Problem List   Diagnosis Date Noted  . Abnormal EKG 07/28/2017  . Lupus erythematosus 07/28/2017  . Chest pain in adult 07/17/2017  . Essential hypertension 07/17/2017  . History of fibromyalgia 07/17/2017  . Other fatigue 12/18/2016  . Primary insomnia 12/18/2016  . Vitamin D deficiency 12/18/2016  . Former smoker 12/18/2016  . History of cholecystectomy 12/18/2016  . History of gastric bypass 12/18/2016  . Osteoarthritis of both hands 03/28/2016  . 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 MCTD (mixed connective tissue disease) ([place]) + ANA, + Ro, + RNP 03/28/2016  . Osteoarthritis of both knees 03/28/2016  . DDD (degenerative disc disease), lumbar 03/28/2016  . Fibromyalgia 03/28/2016  . ANA positive 10/26/2013  . Morbid obesity, Initial weight - 282, BMI - 42.7  09/09/2012    Past Medical History:  Diagnosis Date  . Anemia   . Anxiety   . Arthritis   . Back pain    "OFF & ON"  . 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 DDD (degenerative disc disease), lumbar 03/28/2016  . Difficulty sleeping    occasional - takes med prn  . Dizziness    per patient   . Fibromyalgia   . Herniated lumbar intervertebral disc   . Hypertension   . Lupus ([place])    "presents as joint pain, occ. rash  . Lupus arthritis ([place])   . MCTD (mixed connective tissue disease) ([place]) 03/28/2016   + ANA, + Ro, + RNP  . Osteoarthritis of both hands 03/28/2016  . Osteoarthritis of both knees 03/28/2016   Moderate     Family History  Problem Relation Age of Onset  . Pulmonary embolism Mother   . Cancer Father        colon  . COPD Other   . Hypertension Other   . Hyperlipidemia Other   . Stroke Other   . Diabetes Other   . Cancer Other   . Heart disease Other   . Rheum arthritis Sister   . Autoimmune disease Daughter   . Graves' disease Son    Past Surgical History:  Procedure Laterality Date  . ABDOMINAL HYSTERECTOMY    . BREATH TEK H PYLORI N/A 09/14/2013   Procedure: BREATH TEK H PYLORI;  Surgeon: Shann Medal, MD;  Location: [facility];  Service: General;  Laterality: N/A;  . CESAREAN SECTION    . GASTRIC ROUX-EN-Y N/A 03/14/2014   Procedure: LAPAROSCOPIC ROUX-EN-Y  GASTRIC BYPASS WITH UPPER ENDOSCOPY;  Surgeon: Alphonsa Overall, MD;  Location: [facility];  Service: General;  Laterality: N/A;  . right foot surgery    . RIGHT/LEFT HEART CATH AND CORONARY ANGIOGRAPHY N/A 07/01/2019   Procedure: RIGHT/LEFT HEART CATH AND CORONARY ANGIOGRAPHY;  Surgeon: Jolaine Artist, MD;  Location: [facility];  Service: Cardiovascular;  Laterality: N/A;  . TUBAL LIGATION     Social History   Social History Narrative   Right handed      Highest level of edu- Assoc      Has a 72 year old son. Lives in 2 story home   Immunization History   Administered Date(s) Administered  . PFIZER(Purple Top)SARS-COV-2 Vaccination 10/01/2019, 10/22/2019, 03/19/2020  . Pneumococcal Polysaccharide-23 04/08/2018  . Zoster Recombinat (Shingrix) 04/07/2018, 07/28/2018     Objective: Vital Signs: There were no vitals taken for this visit.   Physical Exam   Musculoskeletal Exam: ***  CDAI Exam: CDAI Score: -- Patient Global: --; Provider Global: -- Swollen: --; Tender: -- Joint Exam 08/22/2020   No joint exam has been documented for this visit   There is currently no information documented on the homunculus. [name] and complete the homunculus joint exam.  Investigation: No additional findings.  Imaging: No results found.  Recent Labs: Lab Results  Component Value Date   WBC 5.0 05/22/2020   HGB 13.5 05/22/2020   PLT 353 05/22/2020   NA 138 05/22/2020   K 3.6 05/22/2020   CL 97 (L) 05/22/2020   CO2 33 (H) 05/22/2020   GLUCOSE 80 05/22/2020   BUN 20 05/22/2020   CREATININE 0.89 05/22/2020   BILITOT 0.5 05/22/2020   ALKPHOS 90 04/21/2018   AST 18 05/22/2020   ALT 9 05/22/2020   PROT 7.7 05/22/2020   ALBUMIN 4.0 04/21/2018   CALCIUM 9.7 05/22/2020   GFRAA 85 05/22/2020    Speciality Comments: No specialty comments available.  Procedures:  No procedures performed Allergies: Patient has no known allergies.   Assessment / Plan:     Visit Diagnoses: MCTD (mixed connective tissue disease) ([place]) + ANA, + Ro, + RNP  High risk medication use  Primary osteoarthritis of both hands  Primary osteoarthritis of both knees  Chondromalacia of both patellae  Fibromyalgia  Primary insomnia  Other fatigue  Trochanteric bursitis, left hip  DDD (degenerative disc disease), lumbar  History of hypertension  History of cholecystectomy  History of gastric bypass  History of depression  Vitamin D deficiency  History of anxiety  History of anemia  Former smoker  Orders: No orders of  the defined types were placed in this encounter.  No orders of the defined types were placed in this encounter.   Face-to-face time spent with patient was *** minutes. Greater than 50% of time was spent in counseling and coordination of care.  Follow-Up Instructions: No follow-ups on file.   Ofilia Neas, PA-C  Note - This record has been created using Dragon software.  Chart creation errors have been sought, but may not always  have been located. Such creation errors do not reflect on  the standard of medical care.

## 2020-08-22 ENCOUNTER — Ambulatory Visit: Payer: Medicaid Other | Admitting: Physician Assistant

## 2020-08-22 DIAGNOSIS — F5101 Primary insomnia: Secondary | ICD-10-CM

## 2020-08-22 DIAGNOSIS — R5383 Other fatigue: Secondary | ICD-10-CM

## 2020-08-22 DIAGNOSIS — Z862 Personal history of diseases of the blood and blood-forming organs and certain disorders involving the immune mechanism: Secondary | ICD-10-CM

## 2020-08-22 DIAGNOSIS — M7062 Trochanteric bursitis, left hip: Secondary | ICD-10-CM

## 2020-08-22 DIAGNOSIS — Z9884 Bariatric surgery status: Secondary | ICD-10-CM

## 2020-08-22 DIAGNOSIS — M351 Other overlap syndromes: Secondary | ICD-10-CM

## 2020-08-22 DIAGNOSIS — E559 Vitamin D deficiency, unspecified: Secondary | ICD-10-CM

## 2020-08-22 DIAGNOSIS — Z87891 Personal history of nicotine dependence: Secondary | ICD-10-CM

## 2020-08-22 DIAGNOSIS — M2241 Chondromalacia patellae, right knee: Secondary | ICD-10-CM

## 2020-08-22 DIAGNOSIS — M170 Bilateral primary osteoarthritis of knee: Secondary | ICD-10-CM

## 2020-08-22 DIAGNOSIS — Z79899 Other long term (current) drug therapy: Secondary | ICD-10-CM

## 2020-08-22 DIAGNOSIS — Z8679 Personal history of other diseases of the circulatory system: Secondary | ICD-10-CM

## 2020-08-22 DIAGNOSIS — M797 Fibromyalgia: Secondary | ICD-10-CM

## 2020-08-22 DIAGNOSIS — Z8659 Personal history of other mental and behavioral disorders: Secondary | ICD-10-CM

## 2020-08-22 DIAGNOSIS — Z9049 Acquired absence of other specified parts of digestive tract: Secondary | ICD-10-CM

## 2020-08-22 DIAGNOSIS — M5136 Other intervertebral disc degeneration, lumbar region: Secondary | ICD-10-CM

## 2020-08-22 DIAGNOSIS — M19041 Primary osteoarthritis, right hand: Secondary | ICD-10-CM

## 2020-08-25 ENCOUNTER — Other Ambulatory Visit: Payer: Self-pay | Admitting: Rheumatology

## 2020-08-27 NOTE — Telephone Encounter (Signed)
LMOM for patient to call and schedule 3 month follow-up appointment. °

## 2020-08-27 NOTE — Telephone Encounter (Signed)
Please call patient to schedule appt. Thank you. Return in about 3 months (around 08/20/2020) for MCTD, Osteoarthritis.

## 2020-08-27 NOTE — Telephone Encounter (Signed)
Next Visit: Message sent to front desk to schedule appt, Return in about 3 months (around 08/20/2020) for MCTD, Osteoarthritis.  Last Visit: 05/22/2020  Last Fill:  07/02/2020  DX: MCTD (mixed connective tissue disease) ([facility]) + ANA, + Ro, + RNP   Current Dose per office note 05/22/2020, Imuran 50 mg 2 tablets daily.  Labs: 05/22/2020, UA shows trace leukocytes and a few bacteria probably contaminated. CBC and CMP are normal. Complements are normal. Sed rate is normal. Double-stranded DNA is normal.  Okay to refill Imuran?

## 2020-10-11 ENCOUNTER — Other Ambulatory Visit (HOSPITAL_COMMUNITY): Payer: Self-pay

## 2020-10-11 MED ORDER — POTASSIUM CHLORIDE CRYS ER 20 MEQ PO TBCR: EXTENDED_RELEASE_TABLET | ORAL | 0 refills | Status: DC

## 2020-10-13 ENCOUNTER — Other Ambulatory Visit (HOSPITAL_COMMUNITY): Payer: Self-pay | Admitting: Internal Medicine

## 2020-12-03 NOTE — Progress Notes (Signed)
Office Visit Note  Patient: Holly Davidson             Date of Birth: 1965-11-13           MRN: [id number]             PCP: Clancy Gourd, NP Referring: Clancy Gourd, NP Visit Date: 12/07/2020 Occupation: _0 @  Subjective:  Left knee joint pain   History of Present Illness: Holly Davidson is a 55 y.o. female with history of mixed connective tissue disease, osteoarthritis, and fibromyalgia.  Patient is taking Imuran 50 mg 2 tablets by mouth daily.  She is tolerating Imuran without any side effects and has not missed any doses recently.  She states that she has establish care with a new PCP who has been very thorough and she has been happy with her care.  She states that she has been seeing her PCP for increased pain in the left knee joint.  She states that she was sent for an ultrasound to rule out a DVT and I had updated x-rays of the left knee.  She states that she is waiting to be scheduled the MRI of her left knee after accidentally missing the appointment.  She has been wrapping her left knee in an Ace wrap to help with the pain and swelling.  She is also been using a cane and a knee brace to improve her stability.  Her PCP sent in a prednisone Dosepak yesterday which she plans on starting today.  She states that at times both knees buckle so she has been scared of falling.  She plans on following up with her orthopedist Dr. Rhona Raider after the MRI is completed.  She states that her fibromyalgia has been flaring more frequently.  She is having generalized myalgias and muscle tenderness.  She remains on Lyrica and tizanidine as prescribed.  She continues to follow-up with pain management at [facility].  She has been experiencing intermittent pain and inflammation in both hands and both feet.  She denies any symptoms of Raynaud's.  She continues to have intermittent facial rashes.  She has ongoing sicca symptoms despite using eyedrops as well as Biotene products.  She is also  been taking pilocarpine 5 mg 3 times daily for symptomatic relief.  She denies any swollen lymph nodes.  She has occasional oral ulcers but denies any nasal ulcerations.  She denies any shortness of breath or pleuritic chest pain.  She continues to follow-up with Dr. Haroldine Laws as recommended.  She had an echocardiogram and PFTs performed in 2019. According to the patient her PCP recently updated her bone density which was normal.  She continues to take a calcium and vitamin D supplement.    Activities of Daily Living:  Patient reports morning stiffness for couple hours  Patient Reports nocturnal pain.  Difficulty dressing/grooming: Denies Difficulty climbing stairs: Reports Difficulty getting out of chair: Reports Difficulty using hands for taps, buttons, cutlery, and/or writing: Reports  Review of Systems  Constitutional:  Positive for fatigue.  HENT:  Positive for mouth sores and mouth dryness. Negative for nose dryness.   Eyes:  Positive for dryness. Negative for pain and visual disturbance.  Respiratory:  Negative for cough, hemoptysis, shortness of breath and difficulty breathing.   Cardiovascular:  Negative for chest pain, palpitations, hypertension and swelling in legs/feet.  Gastrointestinal:  Positive for constipation. Negative for blood in stool and diarrhea.  Endocrine: Negative for increased urination.  Genitourinary:  Negative for painful  urination.  Musculoskeletal:  Positive for joint pain, joint pain, myalgias, morning stiffness, muscle tenderness and myalgias. Negative for joint swelling and muscle weakness.  Skin:  Positive for rash. Negative for color change, pallor, hair loss, nodules/bumps, skin tightness, ulcers and sensitivity to sunlight.  Allergic/Immunologic: Negative for susceptible to infections.  Neurological:  Negative for dizziness, numbness, headaches and weakness.  Hematological:  Negative for swollen glands.  Psychiatric/Behavioral:  Positive for sleep  disturbance. Negative for depressed mood. The patient is not nervous/anxious.    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   MCTD (mixed connective tissue disease) ([place]) + ANA, + Ro, + RNP 03/28/2016   Osteoarthritis of both knees 03/28/2016   DDD (degenerative disc disease), lumbar 03/28/2016   Fibromyalgia 03/28/2016   ANA positive 10/26/2013   Morbid obesity, Initial weight - 282, BMI - 42.7 09/09/2012    Past Medical History:  Diagnosis Date   Anemia    Anxiety    Arthritis    Back pain    "OFF & ON"   Chondromalacia of both patellae 03/28/2016   Mild   DDD (degenerative disc disease), lumbar 03/28/2016   Difficulty sleeping    occasional - takes med prn   Dizziness    per patient    Fibromyalgia    Herniated lumbar intervertebral disc    Hypertension    Lupus ([place])    "presents as joint pain, occ. rash   Lupus arthritis ([facility])    MCTD (mixed connective tissue disease) ([place]) 03/28/2016   + ANA, + Ro, + RNP   Osteoarthritis of both hands 03/28/2016   Osteoarthritis of both knees 03/28/2016   Moderate    Post-menopausal atrophic vaginitis     Family History  Problem Relation Age of Onset   Pulmonary embolism Mother    Cancer Father        colon   COPD Other    Hypertension Other    Hyperlipidemia Other    Stroke Other    Diabetes Other    Cancer Other    Heart disease Other    Rheum arthritis Sister    Autoimmune disease Daughter    Berenice Primas' disease Son    Past Surgical History:   Procedure Laterality Date   ABDOMINAL HYSTERECTOMY     BREATH TEK H PYLORI N/A 09/14/2013   Procedure: BREATH TEK H PYLORI;  Surgeon: Shann Medal, MD;  Location: [facility];  Service: General;  Laterality: N/A;   CESAREAN SECTION     GASTRIC ROUX-EN-Y N/A 03/14/2014   Procedure: LAPAROSCOPIC ROUX-EN-Y GASTRIC BYPASS WITH UPPER ENDOSCOPY;  Surgeon: Alphonsa Overall, MD;  Location: [facility];  Service: General;  Laterality: N/A;   right foot surgery     RIGHT/LEFT HEART CATH AND CORONARY ANGIOGRAPHY N/A 07/01/2019   Procedure: RIGHT/LEFT HEART CATH AND CORONARY ANGIOGRAPHY;  Surgeon: Jolaine Artist, MD;  Location: [facility];  Service: Cardiovascular;  Laterality: N/A;   TUBAL LIGATION     Social History   Social History Narrative  Right handed      Highest level of edu- Assoc      Has a 31 year old son. Lives in 2 story home   Immunization History  Administered Date(s) Administered   PFIZER(Purple Top)SARS-COV-2 Vaccination 10/01/2019, 10/22/2019, 03/19/2020   Pneumococcal Polysaccharide-23 04/08/2018   Zoster Recombinat (Shingrix) 04/07/2018, 07/28/2018     Objective: Vital Signs: BP 120/80 (BP Location: Left Arm, Patient Position: Sitting, Cuff Size: Large)   Pulse 87   Resp 12   Ht _0  (1.676 m)   Wt 255 lb 3.2 oz (115.8 kg)   BMI 41.19 kg/m    Physical Exam Vitals and nursing note reviewed.  Constitutional:      Appearance: She is well-developed.  HENT:     Head: Normocephalic and atraumatic.  Eyes:     Conjunctiva/sclera: Conjunctivae normal.  Cardiovascular:     Rate and Rhythm: Normal rate and regular rhythm.     Heart sounds: Normal heart sounds.  Pulmonary:     Effort: Pulmonary effort is normal.     Breath sounds: Normal breath sounds.  Abdominal:     Palpations: Abdomen is soft.  Musculoskeletal:     Cervical back: Normal range of motion.  Skin:    General: Skin is warm and dry.     Capillary Refill: Capillary refill takes less than 2  seconds.  Neurological:     Mental Status: She is alert and oriented to person, place, and time.  Psychiatric:        Behavior: Behavior normal.     Musculoskeletal Exam: Generalized hyperalgesia and positive tender points noted.  C-spine has good range of motion with no discomfort.  Trapezius muscle tension and tenderness bilaterally.  Shoulder joints, elbow joints, wrist joints, MCPs, PIPs, DIPs have good range of motion with no synovitis.  Tenderness over the left fourth PIP and right third PIP joint.  Complete fist formation bilaterally.  Painful ROM of both knee joints.  Left lower extremity wrapped in Ace wrap.  Ankle joints have good ROM with tenderness bilaterally.   CDAI Exam: CDAI Score: -- Patient Global: --; Provider Global: -- Swollen: 0 ; Tender: 2  Joint Exam 12/07/2020      Right  Left  PIP 3   Tender     PIP 4      Tender     Investigation: No additional findings.  Imaging: No results found.  Recent Labs: Lab Results  Component Value Date   WBC 5.0 05/22/2020   HGB 13.5 05/22/2020   PLT 353 05/22/2020   NA 138 05/22/2020   K 3.6 05/22/2020   CL 97 (L) 05/22/2020   CO2 33 (H) 05/22/2020   GLUCOSE 80 05/22/2020   BUN 20 05/22/2020   CREATININE 0.89 05/22/2020   BILITOT 0.5 05/22/2020   ALKPHOS 90 04/21/2018   AST 18 05/22/2020   ALT 9 05/22/2020   PROT 7.7 05/22/2020   ALBUMIN 4.0 04/21/2018   CALCIUM 9.7 05/22/2020   GFRAA 85 05/22/2020    Speciality Comments: No specialty comments available.  Procedures:  No procedures performed Allergies: Patient has no known allergies.    Assessment / Plan:     Visit Diagnoses: MCTD (mixed connective tissue disease) ([place]) + ANA, + Ro, + RNP -She has ongoing sicca symptoms, intermittent oral ulcerations, facial rashes, and arthralgias.  Overall her symptoms have been clinically stable on Imuran 50 mg 2 tablets by mouth daily.  She has not missed any doses of Imuran recently.  She remains on pilocarpine 5 mg  3 times daily as well as has been using Biotene products and eyedrops for symptomatic relief.  She has no oral or nasal ulcerations at this time.  She has not had any symptoms of Raynaud's and no digital ulcerations or signs of sclerodactyly were noted.  She continues to have chronic fatigue secondary to insomnia.  She has been having more frequent and severe fibromyalgia flares recently.  She had no synovitis on examination today.  She has been following up with her PCP for management of left knee joint pain and will be scheduling an MRI for further evaluation.  She has not had any shortness of breath or pleuritic chest pain.  She continues to follow-up with Dr. Haroldine Laws as recommended.  She had a routine echocardiogram and PFTs in 2019.   Lab work from 05/22/2020 was reviewed today in the office: Double-stranded DNA negative, ESR within normal limits, and complements within normal limits.  She is overdue to update CBC and CMP.  Orders were released.  Her next lab work will be due in October and every 3 months to monitor for drug toxicity. She will remain on Imuran as prescribed.  She was advised to notify us if she develops any new or worsening symptoms.  She will follow-up in the office in 6 months. Plan: CBC with Differential/Platelet, Protein / creatinine ratio, urine, COMPLETE METABOLIC PANEL WITH GFR, Anti-DNA antibody, double-stranded, C3 and C4, Sedimentation rate, ANA, RNP Antibody, Sjogrens syndrome-A extractable nuclear antibody  High risk medication use - Imuran 50 mg 2 tablets by mouth daily.  CBC and CMP updated on 05/22/2020.  She is overdue to update lab work.  Orders for CBC and CMP were released.  Her next lab work will be due in October and every 3 months to monitor for drug toxicity.  Standing orders for CBC and CMP remain in place.- Plan: CBC with Differential/Platelet, COMPLETE METABOLIC PANEL WITH GFR She has not had any recent infections.  Primary osteoarthritis of both hands: PIP and  DIP prominence consistent with osteoarthritis of both hands noted.  She has been experiencing intermittent pain and inflammation in both hands.  No obvious synovitis was noted on examination today.  We discussed the importance of joint protection and muscle strengthening.  Primary osteoarthritis of both knees - She continues to have chronic pain in both knee joints.  She has been following up closely with her PCP for management of the left knee joint pain.  According to the patient she had updated x-rays as well as an ultrasound to rule out a DVT.  She will be scheduling an MRI of the left knee for further evaluation and plans on following up with Dr. Rhona Raider at that time.  She has been using an Ace wrap or a knee brace for support as well as using a cane to assist with ambulation.  Her PCP sent in a prednisone Dosepak yesterday which she plans on starting today.  She will continue to follow-up with pain management.  Chondromalacia of both patellae: She has bilateral knee crepitus.  Fibromyalgia: She has generalized hyperalgesia and positive tender points on examination today.  She has been having more frequent and severe fibromyalgia flares recently.  She remains on Lyrica and tizanidine as prescribed.  She is also been following along with pain management closely and takes oxycodone 15 mg 4 times daily for pain relief.  Discussed the importance of regular exercise and good sleep hygiene.  Other fatigue: Chronic.  Primary insomnia: She has been experiencing increased nocturnal pain which has been contributing to her insomnia.  Discussed the importance of good sleep hygiene.  Trochanteric bursitis, left hip: She experiences intermittent discomfort due to trochanteric bursitis of the left hip.  DDD (degenerative disc disease), lumbar: Chronic pain.  Followed by pain management.  Vitamin D deficiency: She has been taking a calcium and vitamin D supplement on a daily basis.  According to the patient  her new PCP updated her bone density which was normal.  We will call to obtain these records.  Other medical conditions are listed as follows:   History of hypertension: Blood pressure was 120/80 today in the office.  Other medical conditions are listed as follows:  History of depression  History of cholecystectomy  History of gastric bypass  History of anxiety  History of anemia  Former smoker  Orders: Orders Placed This Encounter  Procedures   CBC with Differential/Platelet   Protein / creatinine ratio, urine   COMPLETE METABOLIC PANEL WITH GFR   Anti-DNA antibody, double-stranded   C3 and C4   Sedimentation rate   ANA   RNP Antibody   Sjogrens syndrome-A extractable nuclear antibody   No orders of the defined types were placed in this encounter.    Follow-Up Instructions: Return in about 5 months (around 05/09/2021) for Mixed connective tissue disease, Fibromyalgia.   Ofilia Neas, PA-C  Note - This record has been created using Dragon software.  Chart creation errors have been sought, but may not always  have been located. Such creation errors do not reflect on  the standard of medical care.

## 2020-12-07 ENCOUNTER — Encounter: Payer: Self-pay | Admitting: Physician Assistant

## 2020-12-07 ENCOUNTER — Other Ambulatory Visit: Payer: Self-pay

## 2020-12-07 ENCOUNTER — Ambulatory Visit (INDEPENDENT_AMBULATORY_CARE_PROVIDER_SITE_OTHER): Payer: Medicare Other | Admitting: Physician Assistant

## 2020-12-07 VITALS — BP 120/80 | HR 87 | Resp 12 | Ht 66.0 in | Wt 255.2 lb

## 2020-12-07 DIAGNOSIS — Z79899 Other long term (current) drug therapy: Secondary | ICD-10-CM | POA: Diagnosis not present

## 2020-12-07 DIAGNOSIS — M19042 Primary osteoarthritis, left hand: Secondary | ICD-10-CM

## 2020-12-07 DIAGNOSIS — M170 Bilateral primary osteoarthritis of knee: Secondary | ICD-10-CM | POA: Diagnosis not present

## 2020-12-07 DIAGNOSIS — M2242 Chondromalacia patellae, left knee: Secondary | ICD-10-CM

## 2020-12-07 DIAGNOSIS — Z8679 Personal history of other diseases of the circulatory system: Secondary | ICD-10-CM

## 2020-12-07 DIAGNOSIS — M797 Fibromyalgia: Secondary | ICD-10-CM

## 2020-12-07 DIAGNOSIS — F5101 Primary insomnia: Secondary | ICD-10-CM

## 2020-12-07 DIAGNOSIS — M2241 Chondromalacia patellae, right knee: Secondary | ICD-10-CM

## 2020-12-07 DIAGNOSIS — M351 Other overlap syndromes: Secondary | ICD-10-CM

## 2020-12-07 DIAGNOSIS — Z9049 Acquired absence of other specified parts of digestive tract: Secondary | ICD-10-CM

## 2020-12-07 DIAGNOSIS — M51369 Other intervertebral disc degeneration, lumbar region without mention of lumbar back pain or lower extremity pain: Secondary | ICD-10-CM

## 2020-12-07 DIAGNOSIS — Z8659 Personal history of other mental and behavioral disorders: Secondary | ICD-10-CM

## 2020-12-07 DIAGNOSIS — Z9884 Bariatric surgery status: Secondary | ICD-10-CM

## 2020-12-07 DIAGNOSIS — M19041 Primary osteoarthritis, right hand: Secondary | ICD-10-CM | POA: Diagnosis not present

## 2020-12-07 DIAGNOSIS — Z862 Personal history of diseases of the blood and blood-forming organs and certain disorders involving the immune mechanism: Secondary | ICD-10-CM

## 2020-12-07 DIAGNOSIS — M7062 Trochanteric bursitis, left hip: Secondary | ICD-10-CM

## 2020-12-07 DIAGNOSIS — M5136 Other intervertebral disc degeneration, lumbar region: Secondary | ICD-10-CM

## 2020-12-07 DIAGNOSIS — E559 Vitamin D deficiency, unspecified: Secondary | ICD-10-CM

## 2020-12-07 DIAGNOSIS — R5383 Other fatigue: Secondary | ICD-10-CM

## 2020-12-07 DIAGNOSIS — Z87891 Personal history of nicotine dependence: Secondary | ICD-10-CM

## 2020-12-07 NOTE — Patient Instructions (Signed)
Standing Labs We placed an order today for your standing lab work.   Please have your standing labs drawn in October and every 3 months   If possible, please have your labs drawn 2 weeks prior to your appointment so that the provider can discuss your results at your appointment.  Please note that you may see your imaging and lab results in MyChart before we have reviewed them. We may be awaiting multiple results to interpret others before contacting you. Please allow our office up to 72 hours to thoroughly review all of the results before contacting the office for clarification of your results.  We have open lab daily: Monday through Thursday from 1:30-4:30 PM and Friday from 1:30-4:00 PM at the office of Dr. Shaili Deveshwar, [facility].   Please be advised, all patients with office appointments requiring lab work will take precedent over walk-in lab work.  If possible, please come for your lab work on Monday and Friday afternoons, as you may experience shorter wait times. The office is located at [address], [address], [place], [place] [address] No appointment is necessary.   Labs are drawn by [facility]. Please bring your co-pay at the time of your lab draw.  You may receive a bill from [facility] for your lab work.  If you wish to have your labs drawn at another location, please call the office 24 hours in advance to send orders.  If you have any questions regarding directions or hours of operation,  please call [phone].   As a reminder, please drink plenty of water prior to coming for your lab work. Thanks!  

## 2020-12-10 ENCOUNTER — Encounter: Payer: Self-pay | Admitting: *Deleted

## 2020-12-10 LAB — CBC WITH DIFFERENTIAL/PLATELET
Absolute Monocytes: 759 cells/uL (ref 200–950)
Basophils Absolute: 28 cells/uL (ref 0–200)
Basophils Relative: 0.5 %
Eosinophils Absolute: 160 cells/uL (ref 15–500)
Eosinophils Relative: 2.9 %
HCT: 38.7 % (ref 35.0–45.0)
Hemoglobin: 12.1 g/dL (ref 11.7–15.5)
Lymphs Abs: 1496 cells/uL (ref 850–3900)
MCH: 25.1 pg — ABNORMAL LOW (ref 27.0–33.0)
MCHC: 31.3 g/dL — ABNORMAL LOW (ref 32.0–36.0)
MCV: 80.1 fL (ref 80.0–100.0)
MPV: 9.9 fL (ref 7.5–12.5)
Monocytes Relative: 13.8 %
Neutro Abs: 3058 cells/uL (ref 1500–7800)
Neutrophils Relative %: 55.6 %
Platelets: 300 10*3/uL (ref 140–400)
RBC: 4.83 10*6/uL (ref 3.80–5.10)
RDW: 15.2 % — ABNORMAL HIGH (ref 11.0–15.0)
Total Lymphocyte: 27.2 %
WBC: 5.5 10*3/uL (ref 3.8–10.8)

## 2020-12-10 LAB — COMPLETE METABOLIC PANEL WITH GFR
AG Ratio: 1.3 (calc) (ref 1.0–2.5)
ALT: 11 U/L (ref 6–29)
AST: 27 U/L (ref 10–35)
Albumin: 4 g/dL (ref 3.6–5.1)
Alkaline phosphatase (APISO): 75 U/L (ref 37–153)
BUN: 14 mg/dL (ref 7–25)
CO2: 35 mmol/L — ABNORMAL HIGH (ref 20–32)
Calcium: 9.6 mg/dL (ref 8.6–10.4)
Chloride: 101 mmol/L (ref 98–110)
Creat: 0.91 mg/dL (ref 0.50–1.03)
Globulin: 3.1 g/dL (calc) (ref 1.9–3.7)
Glucose, Bld: 81 mg/dL (ref 65–99)
Potassium: 4.1 mmol/L (ref 3.5–5.3)
Sodium: 140 mmol/L (ref 135–146)
Total Bilirubin: 0.4 mg/dL (ref 0.2–1.2)
Total Protein: 7.1 g/dL (ref 6.1–8.1)
eGFR: 75 mL/min/{1.73_m2} (ref >=60)

## 2020-12-10 LAB — SJOGRENS SYNDROME-A EXTRACTABLE NUCLEAR ANTIBODY: SSA (Ro) (ENA) Antibody, IgG: 3.9 AI — AB

## 2020-12-10 LAB — PROTEIN / CREATININE RATIO, URINE
Creatinine, Urine: 66 mg/dL (ref 20–275)
Protein/Creat Ratio: 61 mg/g creat (ref 21–161)
Protein/Creatinine Ratio: 0.061 mg/mg creat (ref 0.021–0.161)
Total Protein, Urine: 4 mg/dL — ABNORMAL LOW (ref 5–24)

## 2020-12-10 LAB — ANA: Anti Nuclear Antibody (ANA): POSITIVE — AB

## 2020-12-10 LAB — C3 AND C4
C3 Complement: 126 mg/dL (ref 83–193)
C4 Complement: 32 mg/dL (ref 15–57)

## 2020-12-10 LAB — ANTI-NUCLEAR AB-TITER (ANA TITER): ANA Titer 1: 1:1280 {titer} — ABNORMAL HIGH

## 2020-12-10 LAB — ANTI-DNA ANTIBODY, DOUBLE-STRANDED: ds DNA Ab: 1 IU/mL

## 2020-12-10 LAB — RNP ANTIBODY: Ribonucleic Protein(ENA) Antibody, IgG: 1.6 AI — AB

## 2020-12-10 LAB — SEDIMENTATION RATE: Sed Rate: 11 mm/h (ref 0–30)

## 2020-12-10 NOTE — Progress Notes (Signed)
ANA, RNP, and Ro antibodies remain positive.  No proteinuria noted.  CBC and CMP stable.  dsDNA is negative.  Complements and ESR WNL.

## 2020-12-13 ENCOUNTER — Telehealth: Payer: Self-pay

## 2020-12-13 ENCOUNTER — Other Ambulatory Visit: Payer: Self-pay | Admitting: Physician Assistant

## 2020-12-13 NOTE — Telephone Encounter (Signed)
Patient advised her prescription was not denied. Patient advised her prescription was sent to the pharmacy today. Patient states she will reach out to the pharmacy again.

## 2020-12-13 NOTE — Telephone Encounter (Signed)
Patient called stating she received a call from [facility] that Dr. Corliss Skains denied her refill of Imuran.  Patient requested a return call.

## 2020-12-13 NOTE — Telephone Encounter (Signed)
Next Visit: 05/08/2021  Last Visit: 12/07/2020  Last Fill: 08/27/2020  DX: MCTD  Current Dose per office note 12/07/2020: Imuran 50 mg 2 tablets by mouth daily  Labs: 12/07/2020 ANA, RNP, and Ro antibodies remain positive.  No proteinuria noted.  CBC andCMP stable.  dsDNA is negative.  Complements and ESR WNL.   Okay to refill Imuran?  

## 2021-01-09 ENCOUNTER — Other Ambulatory Visit: Payer: Self-pay | Admitting: Rheumatology

## 2021-01-09 NOTE — Telephone Encounter (Signed)
Next Visit: 05/08/2021  Last Visit: 12/07/2020  Last Fill: 05/16/2020   Current Dose per office note on 12/07/2020: not discussed  Okay to refill Pilocarpine?

## 2021-01-16 ENCOUNTER — Telehealth: Payer: Self-pay | Admitting: *Deleted

## 2021-01-16 NOTE — Telephone Encounter (Signed)
Labs received from:[facility]   Drawn on:01/10/2021  Reviewed by:Sherron Ales, PA-C  Labs drawn:Lipid Panel, Hgb A1C, Covid IgG Quantitative, CMP, PTH Folate, Screening UDT, CBC, Iron Binding Capacity, Vitamin B12, Magnesium,   Results:Hgb A1C 5.9, CO2 33.1, MCV 80, MPV 10.2, NE% 44.5, MO% 13.4, FE 51, TIBC 524.00, PTH 170.80  Patient is on Pilocarpine 5 mg po TID and Imuran 100 mg po daily.

## 2021-02-26 ENCOUNTER — Other Ambulatory Visit: Payer: Self-pay

## 2021-02-26 MED ORDER — AZATHIOPRINE 50 MG PO TABS: ORAL_TABLET | ORAL | 2 refills | Status: DC

## 2021-02-26 NOTE — Telephone Encounter (Signed)
Refill request received via fax from [facility] on [address] in [place] for Imuran.   Next Visit: 05/08/2021  Last Visit: 12/07/2020  Last Fill: 12/13/2020  DX: MCTD (mixed connective tissue disease)  Current Dose per office note on 12/07/2020: Imuran 50 mg 2 tablets by mouth daily  Labs: 01/10/2021  Okay to refill Imuran?

## 2021-03-06 ENCOUNTER — Other Ambulatory Visit: Payer: Self-pay | Admitting: Rheumatology

## 2021-03-08 ENCOUNTER — Ambulatory Visit: Payer: Medicare Other | Admitting: Sports Medicine

## 2021-03-15 ENCOUNTER — Other Ambulatory Visit: Payer: Self-pay | Admitting: Physician Assistant

## 2021-03-15 DIAGNOSIS — M25362 Other instability, left knee: Secondary | ICD-10-CM

## 2021-03-15 DIAGNOSIS — M5416 Radiculopathy, lumbar region: Secondary | ICD-10-CM

## 2021-03-22 ENCOUNTER — Ambulatory Visit: Payer: Medicare Other | Admitting: Sports Medicine

## 2021-04-03 ENCOUNTER — Other Ambulatory Visit: Payer: Medicare Other

## 2021-04-15 ENCOUNTER — Other Ambulatory Visit: Payer: Self-pay | Admitting: Physician Assistant

## 2021-04-16 NOTE — Telephone Encounter (Signed)
Next Visit: 05/08/2021   Last Visit: 12/07/2020   Last Fill: 01/09/2021  DX: sicca symptoms  Current Dose per office note on 12/07/2020:  pilocarpine 5 mg 3 times daily  Okay to refill Pilocarpine?

## 2021-04-23 ENCOUNTER — Ambulatory Visit
Admission: RE | Admit: 2021-04-23 | Discharge: 2021-04-23 | Disposition: A | Discharge status: Home / Self Care | Payer: Medicare Other | Source: Ambulatory Visit | Attending: Physician Assistant | Admitting: Physician Assistant

## 2021-04-23 ENCOUNTER — Other Ambulatory Visit: Payer: Self-pay

## 2021-04-23 DIAGNOSIS — M5416 Radiculopathy, lumbar region: Secondary | ICD-10-CM

## 2021-04-23 DIAGNOSIS — M25362 Other instability, left knee: Secondary | ICD-10-CM

## 2021-04-24 NOTE — Progress Notes (Deleted)
Office Visit Note  Patient: Holly Davidson             Date of Birth: 08-Apr-1966           MRN: [id number]             PCP: Alan Ripper, [place] Referring: Alan Ripper, [place] Visit Date: 05/08/2021 Occupation: @GUAROCC @  Subjective:  No chief complaint on file.   History of Present Illness: Holly Davidson is a 55 y.o. female ***   Activities of Daily Living:  Patient reports morning stiffness for *** {minute/hour:19697}.   Patient {ACTIONS;DENIES/REPORTS:21021675::"Denies"} nocturnal pain.  Difficulty dressing/grooming: {ACTIONS;DENIES/REPORTS:21021675::"Denies"} Difficulty climbing stairs: {ACTIONS;DENIES/REPORTS:21021675::"Denies"} Difficulty getting out of chair: {ACTIONS;DENIES/REPORTS:21021675::"Denies"} Difficulty using hands for taps, buttons, cutlery, and/or writing: {ACTIONS;DENIES/REPORTS:21021675::"Denies"}  No Rheumatology ROS completed.   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   MCTD (mixed connective tissue disease) ([place]) + ANA, + Ro, + RNP 03/28/2016   Osteoarthritis of both knees 03/28/2016   DDD (degenerative disc disease), lumbar 03/28/2016   Fibromyalgia 03/28/2016   ANA positive 10/26/2013   Morbid obesity, Initial weight - 282, BMI - 42.7 09/09/2012    Past Medical History:  Diagnosis Date   Anemia    Anxiety    Arthritis    Back pain    "OFF & ON"   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 rash   Lupus arthritis ([facility])    MCTD (mixed connective tissue disease) ([place]) 03/28/2016   + ANA, + Ro, + RNP   Osteoarthritis of both hands 03/28/2016   Osteoarthritis of both knees 03/28/2016   Moderate    Post-menopausal atrophic vaginitis     Family History  Problem Relation Age of Onset   Pulmonary embolism Mother    Cancer Father        colon   COPD Other    Hypertension Other    Hyperlipidemia Other    Stroke Other    Diabetes Other    Cancer Other    Heart disease Other    Rheum arthritis Sister    Autoimmune disease Daughter    Berenice Primas' disease Son    Past Surgical History:  Procedure Laterality Date   ABDOMINAL HYSTERECTOMY     BREATH TEK H PYLORI N/A 09/14/2013   Procedure: BREATH TEK H PYLORI;  Surgeon: Shann Medal, MD;  Location: [facility];  Service: General;  Laterality: N/A;   CESAREAN SECTION     GASTRIC ROUX-EN-Y N/A 03/14/2014   Procedure: LAPAROSCOPIC ROUX-EN-Y GASTRIC BYPASS WITH UPPER ENDOSCOPY;  Surgeon: Alphonsa Overall, MD;  Location: [facility];  Service: General;  Laterality: N/A;   right foot surgery     RIGHT/LEFT HEART CATH AND  CORONARY ANGIOGRAPHY N/A 07/01/2019   Procedure: RIGHT/LEFT HEART CATH AND CORONARY ANGIOGRAPHY;  Surgeon: Dolores Patty, MD;  Location: [facility];  Service: Cardiovascular;  Laterality: N/A;   TUBAL LIGATION     Social History   Social History Narrative   Right handed      Highest level of edu- Assoc      Has a 56 year old son. Lives in 2 story home   Immunization History  Administered Date(s) Administered   PFIZER(Purple Top)SARS-COV-2 Vaccination 10/01/2019, 10/22/2019, 03/19/2020   Pneumococcal Polysaccharide-23 04/08/2018   Zoster Recombinat (Shingrix) 04/07/2018, 07/28/2018     Objective: Vital  Signs: There were no vitals taken for this visit.   Physical Exam   Musculoskeletal Exam: ***  CDAI Exam: CDAI Score: -- Patient Global: --; Provider Global: -- Swollen: --; Tender: -- Joint Exam 05/08/2021   No joint exam has been documented for this visit   There is currently no information documented on the homunculus. [name] and complete the homunculus joint exam.  Investigation: No additional findings.  Imaging: MR LUMBAR SPINE WO CONTRAST  Result Date: 04/24/2021 CLINICAL DATA:  Low back pain with left leg pain. EXAM: MRI LUMBAR SPINE WITHOUT CONTRAST TECHNIQUE: Multiplanar, multisequence MR imaging of the lumbar spine was performed. No intravenous contrast was administered. COMPARISON:  None. FINDINGS: Segmentation:  5 lumbar segments Alignment:  Normal Vertebrae:  Normal bone marrow.  Negative for fracture or mass. Conus medullaris and cauda equina: Conus extends to the L1-2 level. Conus and cauda equina appear normal. Paraspinal and other soft tissues: Negative for paraspinous mass, adenopathy, fluid collection Disc levels: L1-2: Negative L2-3: Mild facet degeneration. Negative for disc degeneration or stenosis. L3-4: Mild disc degeneration with disc bulging and endplate spurring. Bilateral facet degeneration. Mild subarticular stenosis bilaterally L4-5: Moderate to advanced disc degeneration with disc space narrowing and mild spurring. Mild facet degeneration. Negative for stenosis. L5-S1: Moderate disc degeneration with disc space narrowing and endplate spurring. Bilateral facet degeneration. No significant stenosis. IMPRESSION: Disc and facet degeneration at the lower 3 levels. Spinal canal adequate in size throughout. No significant neural impingement. Electronically Signed   By: Marlan Palau M.D.   On: 04/24/2021 11:02   MR KNEE LEFT WO CONTRAST  Result Date: 04/24/2021 CLINICAL DATA:  Chronic left knee pain and instability for years, but worse last  6 months. EXAM: MRI OF THE LEFT KNEE WITHOUT CONTRAST TECHNIQUE: Multiplanar, multisequence MR imaging of the knee was performed. No intravenous contrast was administered. COMPARISON:  X-ray knee 11/27/2015. FINDINGS: MENISCI Medial meniscus: Mild intrasubstance degeneration without focal tear. Lateral meniscus: Obliquely oriented tear of the lateral meniscal anterior horn extending into the anterior root attachment site (series 8, images 16-17). Background of mild intrasubstance degeneration. LIGAMENTS Cruciates: Intact ACL and PCL. Collaterals: Medial collateral ligament is intact. Lateral collateral ligament complex is intact. CARTILAGE Patellofemoral: Irregular area of focal full-thickness chondral loss along the inferior margin of the medial trochlea (series 7, image 12). Chondral thinning and surface irregularity along the inferior aspect of the medial patellar facet. Medial:  No chondral defect. Lateral: No chondral defect. Joint:  Small knee joint effusion.  Fat pads within normal limits. Popliteal Fossa:  Small Baker's cyst.  Intact popliteus tendon. Extensor Mechanism: Intact quadriceps tendon and patellar tendon. Bones: Mild reactive subchondral marrow signal changes at the medial patella and medial trochlea. No fracture. No malalignment. No suspicious bone lesion. Other: Nonspecific circumferential subcutaneous edema. IMPRESSION: 1. Focal tear of the  lateral meniscal anterior horn extending into the anterior root attachment site. 2. Mild patellofemoral compartment osteoarthritis. 3. Small knee joint effusion and small Baker's cyst. Electronically Signed   By: Davina Poke D.O.   On: 04/24/2021 13:40    Recent Labs: Lab Results  Component Value Date   WBC 5.5 12/07/2020   HGB 12.1 12/07/2020   PLT 300 12/07/2020   NA 140 12/07/2020   K 4.1 12/07/2020   CL 101 12/07/2020   CO2 35 (H) 12/07/2020   GLUCOSE 81 12/07/2020   BUN 14 12/07/2020   CREATININE 0.91 12/07/2020   BILITOT 0.4  12/07/2020   ALKPHOS 90 04/21/2018   AST 27 12/07/2020   ALT 11 12/07/2020   PROT 7.1 12/07/2020   ALBUMIN 4.0 04/21/2018   CALCIUM 9.6 12/07/2020   GFRAA 85 05/22/2020    Speciality Comments: No specialty comments available.  Procedures:  No procedures performed Allergies: Patient has no known allergies.   Assessment / Plan:     Visit Diagnoses: No diagnosis found.  Orders: No orders of the defined types were placed in this encounter.  No orders of the defined types were placed in this encounter.   Face-to-face time spent with patient was *** minutes. Greater than 50% of time was spent in counseling and coordination of care.  Follow-Up Instructions: No follow-ups on file.   Earnestine Mealing, CMA  Note - This record has been created using Editor, commissioning.  Chart creation errors have been sought, but may not always  have been located. Such creation errors do not reflect on  the standard of medical care.

## 2021-05-08 ENCOUNTER — Ambulatory Visit: Payer: Medicare Other | Admitting: Podiatry

## 2021-05-08 ENCOUNTER — Ambulatory Visit: Payer: Medicare Other | Admitting: Rheumatology

## 2021-05-08 DIAGNOSIS — M7062 Trochanteric bursitis, left hip: Secondary | ICD-10-CM

## 2021-05-08 DIAGNOSIS — M2242 Chondromalacia patellae, left knee: Secondary | ICD-10-CM

## 2021-05-08 DIAGNOSIS — Z87891 Personal history of nicotine dependence: Secondary | ICD-10-CM

## 2021-05-08 DIAGNOSIS — E559 Vitamin D deficiency, unspecified: Secondary | ICD-10-CM

## 2021-05-08 DIAGNOSIS — Z79899 Other long term (current) drug therapy: Secondary | ICD-10-CM

## 2021-05-08 DIAGNOSIS — M797 Fibromyalgia: Secondary | ICD-10-CM

## 2021-05-08 DIAGNOSIS — M19041 Primary osteoarthritis, right hand: Secondary | ICD-10-CM

## 2021-05-08 DIAGNOSIS — Z8659 Personal history of other mental and behavioral disorders: Secondary | ICD-10-CM

## 2021-05-08 DIAGNOSIS — Z9884 Bariatric surgery status: Secondary | ICD-10-CM

## 2021-05-08 DIAGNOSIS — Z9049 Acquired absence of other specified parts of digestive tract: Secondary | ICD-10-CM

## 2021-05-08 DIAGNOSIS — Z862 Personal history of diseases of the blood and blood-forming organs and certain disorders involving the immune mechanism: Secondary | ICD-10-CM

## 2021-05-08 DIAGNOSIS — M351 Other overlap syndromes: Secondary | ICD-10-CM

## 2021-05-08 DIAGNOSIS — F5101 Primary insomnia: Secondary | ICD-10-CM

## 2021-05-08 DIAGNOSIS — R5383 Other fatigue: Secondary | ICD-10-CM

## 2021-05-08 DIAGNOSIS — M170 Bilateral primary osteoarthritis of knee: Secondary | ICD-10-CM

## 2021-05-08 DIAGNOSIS — M5136 Other intervertebral disc degeneration, lumbar region: Secondary | ICD-10-CM

## 2021-05-08 DIAGNOSIS — Z8679 Personal history of other diseases of the circulatory system: Secondary | ICD-10-CM

## 2021-06-13 ENCOUNTER — Other Ambulatory Visit: Payer: Self-pay | Admitting: Orthopaedic Surgery

## 2021-06-26 NOTE — Progress Notes (Signed)
Office Visit Note  Patient: Holly Davidson             Date of Birth: 1965/12/09           MRN: [id number]             PCP: Alan Ripper, [place] Referring: Alan Ripper, [place] Visit Date: 07/03/2021 Occupation: @GUAROCC @  Subjective:  Pain in both knee joints  History of Present Illness: Holly Davidson is a 57 y.o. female with history of mixed connective tissue disease, osteoarthritis, and fibromyalgia.  Patient is currently taking Imuran 2 tablets by mouth daily.  She tolerates Imuran without any side effects.  She is scheduled for left knee arthroscopic surgery on 07/09/2021 with Dr. Latanya Maudlin to repair a torn meniscus.  She is currently having pain in both knee joints.  She is wearing a brace on the left knee and has been using a cane to assist with ambulation.  She states she is also been having intermittent pain and stiffness in both hands and both wrist joints.  She is currently having a fibromyalgia flare.  She has been taking tizanidine 3 times daily for muscle spasms but continues to have breakthrough pain.  She remains on Lyrica as prescribed.  She has been having more frequent and severe fibromyalgia flares with the cooler weather temperatures.  She is also been having to be more sedentary recently due to the severity of her pain in both knees. Patient reports that she continues to have chronic sicca symptoms.  Her mouth dryness has been severe despite using Biotene products as well as pilocarpine 2-3 times per day.  She is using Systane eyedrops daily for dry eyes.  She denies any sores in her mouth or nose.  She continues to experience intermittent Malar rashes.  She tries to avoid direct sun exposure.  She denies any hair loss and is actually noticed some increased hair growth.  She denies any symptoms of Raynaud's.  She has not had any shortness of breath, pleuritic chest pain, or palpitations.  She denies any swollen lymph nodes or fevers.  She has been experiencing increased  fatigue recently.   Activities of Daily Living:  Patient reports morning stiffness for all day. Patient Reports nocturnal pain.  Difficulty dressing/grooming: Denies Difficulty climbing stairs: Reports Difficulty getting out of chair: Reports Difficulty using hands for taps, buttons, cutlery, and/or writing: Denies  Review of Systems  Constitutional:  Positive for fatigue.  HENT:  Positive for mouth dryness and nose dryness. Negative for mouth sores.   Eyes:  Positive for pain, itching and dryness.  Respiratory:  Negative for shortness of breath and difficulty breathing.   Cardiovascular:  Negative for chest pain and palpitations.  Gastrointestinal:  Negative for blood in stool, constipation and diarrhea.  Endocrine: Negative for increased urination.  Genitourinary:  Negative for difficulty urinating.  Musculoskeletal:  Positive for joint pain, joint pain, joint swelling, myalgias, morning stiffness, muscle tenderness and myalgias.  Skin:  Positive for rash. Negative for color change.  Allergic/Immunologic: Negative for susceptible to infections.  Neurological:  Positive for weakness. Negative for dizziness, numbness, headaches and memory loss.  Hematological:  Positive for bruising/bleeding tendency.  Psychiatric/Behavioral:  Negative for confusion.    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   MCTD (mixed connective tissue disease) ([place]) + ANA, + Ro, + RNP 03/28/2016   Osteoarthritis of both knees  03/28/2016   DDD (degenerative disc disease), lumbar 03/28/2016   Fibromyalgia 03/28/2016   ANA positive 10/26/2013   Morbid obesity, Initial weight - 282, BMI - 42.7 09/09/2012    Past Medical History:  Diagnosis Date   Anemia    Anxiety    Arthritis    Back pain    "OFF & ON"   Chondromalacia of both patellae 03/28/2016   Mild   DDD (degenerative disc disease), lumbar 03/28/2016   Difficulty sleeping    occasional - takes med prn   Dizziness    per patient    Fibromyalgia    Herniated lumbar intervertebral disc    Hypertension    Lupus ([place])    "presents as joint pain, occ. rash   Lupus arthritis (H[facility])    MCTD (mixed connective tissue disease) ([place]) 03/28/2016   + ANA, + Ro, + RNP   Osteoarthritis of both hands 03/28/2016   Osteoarthritis of both knees 03/28/2016   Moderate    Post-menopausal atrophic vaginitis     Family History  Problem Relation Age of Onset   Pulmonary embolism Mother    Cancer Father        colon   COPD Other    Hypertension Other    Hyperlipidemia Other    Stroke Other    Diabetes Other    Cancer Other    Heart disease Other    Rheum arthritis Sister    Autoimmune disease Daughter    Berenice Primas' disease Son    Past Surgical History:  Procedure Laterality Date   ABDOMINAL HYSTERECTOMY     BREATH TEK H PYLORI N/A 09/14/2013   Procedure: BREATH TEK H PYLORI;  Surgeon: Shann Medal, MD;  Location: [facility];  Service: General;  Laterality: N/A;   CESAREAN SECTION     GASTRIC ROUX-EN-Y N/A 03/14/2014   Procedure: LAPAROSCOPIC ROUX-EN-Y GASTRIC BYPASS WITH UPPER ENDOSCOPY;  Surgeon: Alphonsa Overall, MD;  Location: [facility];  Service: General;  Laterality: N/A;   right foot surgery     RIGHT/LEFT HEART CATH AND CORONARY ANGIOGRAPHY N/A 07/01/2019   Procedure: RIGHT/LEFT HEART CATH AND CORONARY ANGIOGRAPHY;  Surgeon: Jolaine Artist, MD;  Location: [facility];  Service: Cardiovascular;  Laterality: N/A;   TUBAL LIGATION     Social  History   Social History Narrative   Right handed      Highest level of edu- Assoc      Has a 75 year old son. Lives in 2 story home   Immunization History  Administered Date(s) Administered   PFIZER(Purple Top)SARS-COV-2 Vaccination 10/01/2019, 10/22/2019, 03/19/2020   Pneumococcal Polysaccharide-23 04/08/2018   Zoster Recombinat (Shingrix) 04/07/2018, 07/28/2018     Objective: Vital Signs: BP 120/87 (BP Location: Right Arm, Patient Position: Sitting, Cuff Size: Large)    Pulse 73    Ht $R'5\' 6"'eZ$  (1.676 m)    Wt 258 lb (117 kg)    BMI 41.64 kg/m    Physical Exam Vitals and nursing note reviewed.  Constitutional:      Appearance: She is well-developed.  HENT:     Head: Normocephalic and atraumatic.  Eyes:     Conjunctiva/sclera: Conjunctivae normal.  Cardiovascular:     Rate and Rhythm: Normal rate and regular rhythm.     Heart sounds: Normal heart sounds.  Pulmonary:     Effort: Pulmonary effort is normal.     Breath sounds: Normal breath sounds.  Abdominal:     General: Bowel sounds are normal.     Palpations: Abdomen is soft.  Musculoskeletal:     Cervical back: Normal range of motion.  Lymphadenopathy:     Cervical: No cervical adenopathy.  Skin:    General: Skin is warm and dry.     Capillary Refill: Capillary refill takes less than 2 seconds.  Neurological:     Mental Status: She is alert and oriented to person, place, and time.  Psychiatric:        Behavior: Behavior normal.     Musculoskeletal Exam: Generalized hyperalgesia and positive tender points on examination.  C-spine has good range of motion.  Postural thoracic kyphosis was noted.  No midline spinal tenderness or SI joint tenderness was noted.  Shoulder joints, elbow joints, wrist joints, MCPs, PIPs, DIPs have good range of motion with no synovitis.  Tenderness over both wrist joints and MCP joints but no inflammation was noted.  Complete fist formation bilaterally.  Hip joints difficult to assess well in  seated position.  Painful range of motion of both knee joints with crepitus.  Warmth of both knees noted.  Ankle joints have good ROM with tenderness bilaterally.   CDAI Exam: CDAI Score: -- Patient Global: --; Provider Global: -- Swollen: 0 ; Tender: 6  Joint Exam 07/03/2021      Right  Left  Wrist   Tender   Tender  Knee   Tender   Tender  Ankle   Tender   Tender   There is currently no information documented on the homunculus. [name] and complete the homunculus joint exam.  Investigation: No additional findings.  Imaging: No results found.  Recent Labs: Lab Results  Component Value Date   WBC 5.5 12/07/2020   HGB 12.1 12/07/2020   PLT 300 12/07/2020   NA 140 12/07/2020   K 4.1 12/07/2020   CL 101 12/07/2020   CO2 35 (H) 12/07/2020   GLUCOSE 81 12/07/2020   BUN 14 12/07/2020   CREATININE 0.91 12/07/2020   BILITOT 0.4 12/07/2020   ALKPHOS 90 04/21/2018   AST 27 12/07/2020   ALT 11 12/07/2020   PROT 7.1 12/07/2020   ALBUMIN 4.0 04/21/2018   CALCIUM 9.6 12/07/2020   GFRAA 85 05/22/2020    Speciality Comments: No specialty comments available.  Procedures:  No procedures performed Allergies: Patient has no known allergies.   Assessment / Plan:     Visit Diagnoses: MCTD (mixed connective tissue disease) ([place]) + ANA, + Ro, + RNP -She has not had any signs or symptoms of a flare recently.  Overall she has clinically been doing well taking Imuran 50 mg 2 tablets by mouth daily.  She continues to tolerate Imuran without any side effects.  She has ongoing sicca symptoms and has been using Systane as well as Biotene products for symptomatic relief.  She has been taking pilocarpine 5 mg 2 to 3 tablets daily but continues to have ongoing mouth dryness.  Discussed the use of a humidifier in her home.  She has not had any oral or nasal ulcerations.  Discussed the importance of good oral hygiene and continuing to follow-up with a  dentist every 6  months. She has not had any shortness of breath, pleuritic chest pain, or palpitations.  She has noticed intermittent Malar rashes but has not noticed any increased photosensitivity.  Discussed the importance of avoiding direct sun exposure and wearing sunscreen SPF greater than 50 on a daily basis.  She has not had any increased hair loss and no signs of alopecia were noted.  No cervical lymphadenopathy was palpable.  She has been experiencing increased fatigue secondary to insomnia.  She is noted increased nocturnal pain in both knees as well as more frequent fibromyalgia flares which has made it difficult to sleep at night.  Discussed the importance of regular exercise and good sleep hygiene.  Lab work from 12/07/2020 was reviewed today in the office: ANA 1:1280 nuclear, multiple nuclear dots, dsDNA 1, ESR 11, RNP 1.6, Ro 3.9, and complements WNL.   The following lab work will be updated today.  She will remain on Imuran as prescribed.  A refill of Imuran was sent to the pharmacy today.  She was advised to notify us if she develops any new or worsening symptoms.  She will follow-up in the office in 3 months.  Plan: Protein / creatinine ratio, urine, COMPLETE METABOLIC PANEL WITH GFR, CBC with Differential/Platelet, ANA, Anti-DNA antibody, double-stranded, C3 and C4, Sedimentation rate, RNP Antibody, Sjogrens syndrome-A extractable nuclear antibody, Serum protein electrophoresis with reflex, Rheumatoid factor  High risk medication use - Imuran 50 mg 2 tablets by mouth daily.  CBC and CMP updated on 12/07/2020.  She is overdue to update lab work.  Orders for CBC and CMP were released.  Her next lab work will be due in May and every 3 months to monitor for drug toxicity.- Plan: COMPLETE METABOLIC PANEL WITH GFR, CBC with Differential/Platelet She has not had any recent infections.  She has not had to miss any doses of Imuran recently.  Primary osteoarthritis of both hands: She has been experiencing increased  pain and stiffness in both hands and both wrists.  No inflammation was noted on examination.  Discussed the importance of joint protection and muscle strengthening. She was given a handout of hand exercises to perform.   Primary osteoarthritis of both knees: Chronic pain.  She is scheduled for left knee arthroscopic surgery on 07/09/2021 by Dr. Latanya Maudlin for a torn meniscus.  She is currently wearing a brace and using a cane to assist with ambulation.  She has had to be more sedentary recently due to the severity of pain in both knees.  She is also had nocturnal pain in both knees making it difficult to sleep at night.   Chondromalacia of both patellae: Bilateral knee crepitus noted.   Fibromyalgia: She has generalized hyperalgesia and positive tender points on examination.  She has been experiencing more frequent and severe fibromyalgia flares with the cooler weather temperatures.  She is also been more sedentary recently due to the severity of pain in both knees.  She is scheduled for a left knee arthroscopic surgery on 07/09/2021.  She plans on trying to increase her activity level once she has been cleared by Dr. Latanya Maudlin after her surgery. She will remain on Lyrica and tizanidine as prescribed.  Discussed the importance of good sleep hygiene.  Primary insomnia: She has been experiencing increased nocturnal pain making it difficult to sleep at night.  Discussed the importance of good sleep hygiene.   Other fatigue: She has been experiencing increased fatigue recently.  She has been more sedentary than usual  due to the severity of pain in both knees.  She plans on increasing her activity level once she has been cleared by Dr. Latanya Maudlin after surgery.    Trochanteric bursitis, left hip: She has ongoing tenderness palpation over the left trochanteric bursa.  Discussed the importance of performing stretching exercises daily.  DDD (degenerative disc disease), lumbar: She is not experiencing any increased  discomfort in her lower back at this time.  She has no midline spinal tenderness or SI joint tenderness.  No symptoms of radiculopathy.  History of hypertension: Blood pressure was 120/87 today in the office.  Other medical conditions are listed as follows:  History of cholecystectomy  History of depression  History of anemia  History of anxiety  History of gastric bypass  Vitamin D deficiency  Former smoker  Orders: Orders Placed This Encounter  Procedures   Protein / creatinine ratio, urine   COMPLETE METABOLIC PANEL WITH GFR   CBC with Differential/Platelet   ANA   Anti-DNA antibody, double-stranded   C3 and C4   Sedimentation rate   RNP Antibody   Sjogrens syndrome-A extractable nuclear antibody   Serum protein electrophoresis with reflex   Rheumatoid factor   Meds ordered this encounter  Medications   azaTHIOprine (IMURAN) 50 MG tablet    Sig: TAKE 2 TABLETS(100 MG) BY MOUTH DAILY    Dispense:  60 tablet    Refill:  2     Follow-Up Instructions: Return in about 3 months (around 09/30/2021) for MCTD.   Ofilia Neas, PA-C  Note - This record has been created using Dragon software.  Chart creation errors have been sought, but may not always  have been located. Such creation errors do not reflect on  the standard of medical care.

## 2021-07-01 NOTE — Progress Notes (Signed)
Your procedure is scheduled on:      07/09/21   Report to [facility] Main  Entrance   Report to admitting at  200pm     Call this number if you have problems the morning of surgery [phone]    REMEMBER: NO  SOLID FOOD CANDY OR GUM AFTER MIDNIGHT. CLEAR LIQUIDS UNTIL    115pm       . NOTHING BY MOUTH EXCEPT CLEAR LIQUIDS UNTIL  115 pm  . PLEASE FINISH ENSURE DRINK PER SURGEON ORDER  WHICH NEEDS TO BE COMPLETED AT   115 pm    .      CLEAR LIQUID DIET   Foods Allowed                                                                    Coffee and tea, regular and decaf                            Fruit ices (not with fruit pulp)                                      Iced Popsicles                                    Carbonated beverages, regular and diet                                    Cranberry, grape and apple juices Sports drinks like Gatorade Lightly seasoned clear broth or consume(fat free) Sugar, honey syrup ___________________________________________________________________      BRUSH YOUR TEETH MORNING OF SURGERY AND RINSE YOUR MOUTH OUT, NO CHEWING GUM CANDY OR MINTS.     Take these medicines the morning of surgery with A SIP OF WATER:  Imuran, Lyrica   DO NOT TAKE ANY DIABETIC MEDICATIONS DAY OF YOUR SURGERY                               You may not have any metal on your body including hair pins and              piercings  Do not wear jewelry, make-up, lotions, powders or perfumes, deodorant             Do not wear nail polish on your fingernails.  Do not shave  48 hours prior to surgery.              Men may shave face and neck.   Do not bring valuables to the hospital. [facility] IS NOT             RESPONSIBLE   FOR VALUABLES.  Contacts, dentures or bridgework may not be worn into surgery.  Leave suitcase in the car. After surgery it may be brought to your room.     Patients discharged the day of surgery will not be allowed  to drive home. IF  YOU ARE HAVING SURGERY AND GOING HOME THE SAME DAY, YOU MUST HAVE AN ADULT TO DRIVE YOU HOME AND BE WITH YOU FOR 24 HOURS. YOU MAY GO HOME BY TAXI OR UBER OR ORTHERWISE, BUT AN ADULT MUST ACCOMPANY YOU HOME AND STAY WITH YOU FOR 24 HOURS.  Name and phone number of your driver:  Special Instructions: N/A              Please read over the following fact sheets you were given: _____________________________________________________________________  [facility] - Preparing for Surgery Before surgery, you can play an important role.  Because skin is not sterile, your skin needs to be as free of germs as possible.  You can reduce the number of germs on your skin by washing with CHG (chlorahexidine gluconate) soap before surgery.  CHG is an antiseptic cleaner which kills germs and bonds with the skin to continue killing germs even after washing. Please DO NOT use if you have an allergy to CHG or antibacterial soaps.  If your skin becomes reddened/irritated stop using the CHG and inform your nurse when you arrive at Short Stay. Do not shave (including legs and underarms) for at least 48 hours prior to the first CHG shower.  You may shave your face/neck. Please follow these instructions carefully:  1.  Shower with CHG Soap the night before surgery and the  morning of Surgery.  2.  If you choose to wash your hair, wash your hair first as usual with your  normal  shampoo.  3.  After you shampoo, rinse your hair and body thoroughly to remove the  shampoo.                           4.  Use CHG as you would any other liquid soap.  You can apply chg directly  to the skin and wash                       Gently with a scrungie or clean washcloth.  5.  Apply the CHG Soap to your body ONLY FROM THE NECK DOWN.   Do not use on face/ open                           Wound or open sores. Avoid contact with eyes, ears mouth and genitals (private parts).                       Wash face,  Genitals (private parts) with your  normal soap.             6.  Wash thoroughly, paying special attention to the area where your surgery  will be performed.  7.  Thoroughly rinse your body with warm water from the neck down.  8.  DO NOT shower/wash with your normal soap after using and rinsing off  the CHG Soap.                9.  Pat yourself dry with a clean towel.            10.  Wear clean pajamas.            11.  Place clean sheets on your bed the night of your first shower and do not  sleep with pets. Day of Surgery : Do not apply any lotions/deodorants  the morning of surgery.  Please wear clean clothes to the hospital/surgery center.  FAILURE TO FOLLOW THESE INSTRUCTIONS MAY RESULT IN THE CANCELLATION OF YOUR SURGERY PATIENT SIGNATURE_________________________________  NURSE SIGNATURE__________________________________  ________________________________________________________________________

## 2021-07-01 NOTE — Progress Notes (Signed)
Anesthesia Review:  PCP: Cardiologist : Chest x-ray : EKG : Echo :2019  Stress test: Cardiac Cath : 2021  Activity level:  Sleep Study/ CPAP : Fasting Blood Sugar :      / Checks Blood Sugar -- times a day:   Blood Thinner/ Instructions /Last Dose: ASA / Instructions/ Last Dose :

## 2021-07-01 NOTE — Progress Notes (Addendum)
Anesthesia Review:  PCP: Chiquita Loth, PA  Cardiologist :DR Bensimhon  Rheumatology- LOV 07/03/21 with PA  LOV 06/29/19  Chest x-ray : EKG : 07/03/21  Echo : 2019  Stress test: Cardiac Cath : 2021  Activity level: can do a flight of stairs without difficulty  Sleep Study/ CPAP : none  Fasting Blood Sugar :      / Checks Blood Sugar -- times a day:   Blood Thinner/ Instructions /Last Dose: ASA / Instructions/ Last Dose :   Phentermine - last dose 07/03/21 PT was not aware until preop appt she was supposed to stop Phentermine.  PT took this am but stated she would take no more.   Prediabetes- hgba1c- 07/03/21- 5.5  Does not check glucose at home Glucose 65 at preop appt.  PT states " little tired' Had just come from another MD appt.  Did not eat breakfast this am before appt.  6 pack of peanut butter crackers and shasta cola given. Upon recheck glucose was 91.  PT to eat lunch once returns home.  Has green tea ( non- diet with her along with another pack of crackers.  PT voices no complaints. PT called transportation to pick her up.  NO covid test- ambulatory surgery  PT was 25 minutes late for preop appt.  BMp done 07/03/21 with potassium of 3.0 routed to Dr Jerl Santos. Nelle Don made aware.

## 2021-07-03 ENCOUNTER — Encounter (HOSPITAL_COMMUNITY): Payer: Self-pay

## 2021-07-03 ENCOUNTER — Encounter: Payer: Self-pay | Admitting: Physician Assistant

## 2021-07-03 ENCOUNTER — Ambulatory Visit (INDEPENDENT_AMBULATORY_CARE_PROVIDER_SITE_OTHER): Payer: Medicare Other | Admitting: Physician Assistant

## 2021-07-03 ENCOUNTER — Other Ambulatory Visit: Payer: Self-pay

## 2021-07-03 ENCOUNTER — Encounter (HOSPITAL_COMMUNITY)
Admission: RE | Admit: 2021-07-03 | Discharge: 2021-07-03 | Disposition: A | Discharge status: Home / Self Care | Payer: Medicare Other | Source: Ambulatory Visit | Attending: Orthopaedic Surgery | Admitting: Orthopaedic Surgery

## 2021-07-03 VITALS — BP 120/87 | HR 73 | Ht 66.0 in | Wt 258.0 lb

## 2021-07-03 VITALS — BP 143/90 | HR 63 | Temp 98.2°F | Resp 16 | Ht 66.0 in | Wt 257.9 lb

## 2021-07-03 DIAGNOSIS — Z01818 Encounter for other preprocedural examination: Secondary | ICD-10-CM | POA: Insufficient documentation

## 2021-07-03 DIAGNOSIS — R5383 Other fatigue: Secondary | ICD-10-CM

## 2021-07-03 DIAGNOSIS — Z8659 Personal history of other mental and behavioral disorders: Secondary | ICD-10-CM

## 2021-07-03 DIAGNOSIS — X58XXXA Exposure to other specified factors, initial encounter: Secondary | ICD-10-CM | POA: Diagnosis not present

## 2021-07-03 DIAGNOSIS — Z79899 Other long term (current) drug therapy: Secondary | ICD-10-CM

## 2021-07-03 DIAGNOSIS — Z862 Personal history of diseases of the blood and blood-forming organs and certain disorders involving the immune mechanism: Secondary | ICD-10-CM

## 2021-07-03 DIAGNOSIS — R011 Cardiac murmur, unspecified: Secondary | ICD-10-CM | POA: Insufficient documentation

## 2021-07-03 DIAGNOSIS — Z9049 Acquired absence of other specified parts of digestive tract: Secondary | ICD-10-CM

## 2021-07-03 DIAGNOSIS — Z8679 Personal history of other diseases of the circulatory system: Secondary | ICD-10-CM

## 2021-07-03 DIAGNOSIS — M797 Fibromyalgia: Secondary | ICD-10-CM

## 2021-07-03 DIAGNOSIS — E559 Vitamin D deficiency, unspecified: Secondary | ICD-10-CM

## 2021-07-03 DIAGNOSIS — M19042 Primary osteoarthritis, left hand: Secondary | ICD-10-CM

## 2021-07-03 DIAGNOSIS — M351 Other overlap syndromes: Secondary | ICD-10-CM | POA: Diagnosis not present

## 2021-07-03 DIAGNOSIS — Z9884 Bariatric surgery status: Secondary | ICD-10-CM

## 2021-07-03 DIAGNOSIS — M2241 Chondromalacia patellae, right knee: Secondary | ICD-10-CM

## 2021-07-03 DIAGNOSIS — R7303 Prediabetes: Secondary | ICD-10-CM

## 2021-07-03 DIAGNOSIS — M51369 Other intervertebral disc degeneration, lumbar region without mention of lumbar back pain or lower extremity pain: Secondary | ICD-10-CM

## 2021-07-03 DIAGNOSIS — I10 Essential (primary) hypertension: Secondary | ICD-10-CM

## 2021-07-03 DIAGNOSIS — M19041 Primary osteoarthritis, right hand: Secondary | ICD-10-CM | POA: Diagnosis not present

## 2021-07-03 DIAGNOSIS — M329 Systemic lupus erythematosus, unspecified: Secondary | ICD-10-CM | POA: Diagnosis not present

## 2021-07-03 DIAGNOSIS — M2242 Chondromalacia patellae, left knee: Secondary | ICD-10-CM

## 2021-07-03 DIAGNOSIS — Z87891 Personal history of nicotine dependence: Secondary | ICD-10-CM

## 2021-07-03 DIAGNOSIS — S83242A Other tear of medial meniscus, current injury, left knee, initial encounter: Secondary | ICD-10-CM | POA: Insufficient documentation

## 2021-07-03 DIAGNOSIS — F5101 Primary insomnia: Secondary | ICD-10-CM

## 2021-07-03 DIAGNOSIS — M7062 Trochanteric bursitis, left hip: Secondary | ICD-10-CM

## 2021-07-03 DIAGNOSIS — M170 Bilateral primary osteoarthritis of knee: Secondary | ICD-10-CM

## 2021-07-03 DIAGNOSIS — M5136 Other intervertebral disc degeneration, lumbar region: Secondary | ICD-10-CM

## 2021-07-03 HISTORY — DX: Prediabetes: R73.03

## 2021-07-03 HISTORY — DX: Cardiac murmur, unspecified: R01.1

## 2021-07-03 LAB — CBC
HCT: 37.6 % (ref 36.0–46.0)
Hemoglobin: 11.9 g/dL — ABNORMAL LOW (ref 12.0–15.0)
MCH: 25.5 pg — ABNORMAL LOW (ref 26.0–34.0)
MCHC: 31.6 g/dL (ref 30.0–36.0)
MCV: 80.5 fL (ref 80.0–100.0)
Platelets: 307 10*3/uL (ref 150–400)
RBC: 4.67 MIL/uL (ref 3.87–5.11)
RDW: 16.8 % — ABNORMAL HIGH (ref 11.5–15.5)
WBC: 4.5 10*3/uL (ref 4.0–10.5)
nRBC: 0 % (ref 0.0–0.2)

## 2021-07-03 LAB — BASIC METABOLIC PANEL
Anion gap: 7 (ref 5–15)
BUN: 9 mg/dL (ref 6–20)
CO2: 29 mmol/L (ref 22–32)
Calcium: 8.8 mg/dL — ABNORMAL LOW (ref 8.9–10.3)
Chloride: 103 mmol/L (ref 98–111)
Creatinine, Ser: 0.66 mg/dL (ref 0.44–1.00)
GFR, Estimated: >60 mL/min (ref >60)
Glucose, Bld: 104 mg/dL — ABNORMAL HIGH (ref 70–99)
Potassium: 3 mmol/L — ABNORMAL LOW (ref 3.5–5.1)
Sodium: 139 mmol/L (ref 135–145)

## 2021-07-03 LAB — HEMOGLOBIN A1C
Hgb A1c MFr Bld: 5.5 % (ref 4.8–5.6)
Mean Plasma Glucose: 111.15 mg/dL

## 2021-07-03 LAB — GLUCOSE, CAPILLARY
Glucose-Capillary: 65 mg/dL — ABNORMAL LOW (ref 70–99)
Glucose-Capillary: 91 mg/dL (ref 70–99)

## 2021-07-03 MED ORDER — AZATHIOPRINE 50 MG PO TABS: ORAL_TABLET | ORAL | 2 refills | Status: DC

## 2021-07-03 NOTE — Patient Instructions (Signed)
Standing Labs °We placed an order today for your standing lab work.  ° °Please have your standing labs drawn in May and every 3 months   ° °If possible, please have your labs drawn 2 weeks prior to your appointment so that the provider can discuss your results at your appointment. ° °Please note that you may see your imaging and lab results in MyChart before we have reviewed them. °We may be awaiting multiple results to interpret others before contacting you. °Please allow our office up to 72 hours to thoroughly review all of the results before contacting the office for clarification of your results. ° °We have open lab daily: °Monday through Thursday from 1:30-4:30 PM and Friday from 1:30-4:00 PM °at the office of Dr. Shaili Deveshwar, [facility].   °Please be advised, all patients with office appointments requiring lab work will take precedent over walk-in lab work.  °If possible, please come for your lab work on Monday and Friday afternoons, as you may experience shorter wait times. °The office is located at [address], [address], [place], [place] [address] °No appointment is necessary.   °Labs are drawn by [facility]. Please bring your co-pay at the time of your lab draw.  You may receive a bill from [facility] for your lab work. ° °Please note if you are on Hydroxychloroquine and and an order has been placed for a Hydroxychloroquine level, you will need to have it drawn 4 hours or more after your last dose. ° °If you wish to have your labs drawn at another location, please call the office 24 hours in advance to send orders. ° °If you have any questions regarding directions or hours of operation,  °please call [phone].   °As a reminder, please drink plenty of water prior to coming for your lab work. Thanks! ° ° °Hand Exercises °Hand exercises can be helpful for almost anyone. These exercises can strengthen the hands, improve flexibility and movement, and increase blood flow to the hands. These  results can make work and daily tasks easier. °Hand exercises can be especially helpful for people who have joint pain from arthritis or have nerve damage from overuse (carpal tunnel syndrome). °These exercises can also help people who have injured a hand. °Exercises °Most of these hand exercises are gentle stretching and motion exercises. It is usually safe to do them often throughout the day. Warming up your hands before exercise may help to reduce stiffness. You can do this with gentle massage or by placing your hands in warm water for 10-15 minutes. °It is normal to feel some stretching, pulling, tightness, or mild discomfort as you begin new exercises. This will gradually improve. Stop an exercise right away if you feel sudden, severe pain or your pain gets worse. Ask your health care provider which exercises are best for you. °Knuckle bend or "claw" fist ° °Stand or sit with your arm, hand, and all five fingers pointed straight up. Make sure to keep your wrist straight during the exercise. °Gently bend your fingers down toward your palm until the tips of your fingers are touching the top of your palm. Keep your big knuckle straight and just bend the small knuckles in your fingers. °Hold this position for __________ seconds. °Straighten (extend) your fingers back to the starting position. °Repeat this exercise 5-10 times with each hand. °Full finger fist ° °Stand or sit with your arm, hand, and all five fingers pointed straight up. Make sure to keep your wrist straight   during the exercise. °Gently bend your fingers into your palm until the tips of your fingers are touching the middle of your palm. °Hold this position for __________ seconds. °Extend your fingers back to the starting position, stretching every joint fully. °Repeat this exercise 5-10 times with each hand. °Straight fist °Stand or sit with your arm, hand, and all five fingers pointed straight up. Make sure to keep your wrist straight during the  exercise. °Gently bend your fingers at the big knuckle, where your fingers meet your hand, and the middle knuckle. Keep the knuckle at the tips of your fingers straight and try to touch the bottom of your palm. °Hold this position for __________ seconds. °Extend your fingers back to the starting position, stretching every joint fully. °Repeat this exercise 5-10 times with each hand. °Tabletop ° °Stand or sit with your arm, hand, and all five fingers pointed straight up. Make sure to keep your wrist straight during the exercise. °Gently bend your fingers at the big knuckle, where your fingers meet your hand, as far down as you can while keeping the small knuckles in your fingers straight. Think of forming a tabletop with your fingers. °Hold this position for __________ seconds. °Extend your fingers back to the starting position, stretching every joint fully. °Repeat this exercise 5-10 times with each hand. °Finger spread ° °Place your hand flat on a table with your palm facing down. Make sure your wrist stays straight as you do this exercise. °Spread your fingers and thumb apart from each other as far as you can until you feel a gentle stretch. Hold this position for __________ seconds. °Bring your fingers and thumb tight together again. Hold this position for __________ seconds. °Repeat this exercise 5-10 times with each hand. °Making circles ° °Stand or sit with your arm, hand, and all five fingers pointed straight up. Make sure to keep your wrist straight during the exercise. °Make a circle by touching the tip of your thumb to the tip of your index finger. °Hold for __________ seconds. Then open your hand wide. °Repeat this motion with your thumb and each finger on your hand. °Repeat this exercise 5-10 times with each hand. °Thumb motion ° °Sit with your forearm resting on a table and your wrist straight. Your thumb should be facing up toward the ceiling. Keep your fingers relaxed as you move your thumb. °Lift  your thumb up as high as you can toward the ceiling. Hold for __________ seconds. °Bend your thumb across your palm as far as you can, reaching the tip of your thumb for the small finger (pinkie) side of your palm. Hold for __________ seconds. °Repeat this exercise 5-10 times with each hand. °Grip strengthening ° °Hold a stress ball or other soft ball in the middle of your hand. °Slowly increase the pressure, squeezing the ball as much as you can without causing pain. Think of bringing the tips of your fingers into the middle of your palm. All of your finger joints should bend when doing this exercise. °Hold your squeeze for __________ seconds, then relax. °Repeat this exercise 5-10 times with each hand. °Contact a health care provider if: °Your hand pain or discomfort gets much worse when you do an exercise. °Your hand pain or discomfort does not improve within 2 hours after you exercise. °If you have any of these problems, stop doing these exercises right away. Do not do them again unless your health care provider says that you can. °Get help right away   if: °You develop sudden, severe hand pain or swelling. If this happens, stop doing these exercises right away. Do not do them again unless your health care provider says that you can. °This information is not intended to replace advice given to you by your health care provider. Make sure you discuss any questions you have with your health care provider. °Document Revised: 08/23/2020 Document Reviewed: 08/23/2020 °Elsevier Patient Education © 2022 [employer] ° ° °

## 2021-07-04 ENCOUNTER — Ambulatory Visit (HOSPITAL_COMMUNITY): Payer: Medicare Other | Admitting: Emergency Medicine

## 2021-07-04 ENCOUNTER — Ambulatory Visit (HOSPITAL_COMMUNITY): Payer: Medicare Other | Admitting: Anesthesiology

## 2021-07-04 NOTE — Progress Notes (Signed)
CBC and CMP stable.  RF negative.  Complements and ESR WNL.

## 2021-07-04 NOTE — Progress Notes (Signed)
Anesthesia Chart Review:   Case: [id number] Date/Time: 07/09/21 1221   Procedure: LEFT KNEE ARTHROSCOPY (Left: Knee)   Anesthesia type: Choice   Pre-op diagnosis: LEFT KNEE MEDIAL MENISCUS TEAR   Location: [facility] 06 / [facility]   Surgeons: Marcene Corning, MD       DISCUSSION: Pt is 56 years old with hx heart murmur, HTN, mixed connective tissue disease, lupus, pre-diabetes  VS: BP (!) 143/90    Pulse 63    Temp 36.8 C (Oral)    Resp 16    Ht 5\' 6"  (1.676 m)    Wt 117 kg    SpO2 99%    BMI 41.63 kg/m   PROVIDERS: - PCP is , [place] - Used to see cardiologist Chiquita Loth, MD for chest pain. Normal cardiac cath in 2021; pain suspected to be MSK in origin. No longer sees cardiology   LABS: Labs reviewed: Acceptable for surgery. (all labs ordered are listed, but only abnormal results are displayed)  Labs Reviewed  CBC - Abnormal; Notable for the following components:      Result Value   Hemoglobin 11.9 (*)    MCH 25.5 (*)    RDW 16.8 (*)    All other components within normal limits  BASIC METABOLIC PANEL - Abnormal; Notable for the following components:   Potassium 3.0 (*)    Glucose, Bld 104 (*)    Calcium 8.8 (*)    All other components within normal limits  GLUCOSE, CAPILLARY - Abnormal; Notable for the following components:   Glucose-Capillary 65 (*)    All other components within normal limits  HEMOGLOBIN A1C  GLUCOSE, CAPILLARY     IMAGES: Cardiac CT 08/08/19- radiology overread:  - No significant incidental findings.    EKG 07/03/21:  Normal sinus rhythm Possible Left atrial enlargement Inferior infarct , age undetermined Anteroseptal infarct , age undetermined   CV: CT coronary morphology 08/08/19:  1. Coronary artery calcium score 2.75 Agatston units. This places the patient in the 79th percentile for age and gender, suggesting intermediate to high risk for future cardiac events. 2.  No obstructive coronary disease.   R/L cardiac cath  07/01/19:  1. Normal coronaries with question forked ostium to RCA  2. EF 60-65% 3. Normal hemodynamics with no evidence of PAH   Echo 05/07/18:  - Left ventricle: The cavity size was normal. Wall thickness was normal. Systolic function was vigorous. The estimated ejection fraction was in the range of 65% to 70%. Wall motion was normal; there were no regional wall motion abnormalities. Doppler parameters are consistent with abnormal left ventricular relaxation (grade 1 diastolic dysfunction).  - Pulmonary arteries: PA peak pressure: 33 mm Hg (S).  - Impressions: No evidence of RV strain or PAH.    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   Procedure: LAPAROSCOPIC ROUX-EN-Y GASTRIC BYPASS WITH UPPER ENDOSCOPY;  Surgeon: Ovidio Kin, MD;  Location: [facility];  Service: General;  Laterality: N/A;   right foot surgery     RIGHT/LEFT HEART CATH AND CORONARY ANGIOGRAPHY N/A 07/01/2019   Procedure: RIGHT/LEFT HEART CATH AND CORONARY ANGIOGRAPHY;  Surgeon:  Dolores Patty, MD;  [facility]: [facility];  Service: Cardiovascular;  Laterality: N/A;   TUBAL LIGATION      MEDICATIONS:  acetaminophen (TYLENOL) 325 MG tablet   antiseptic oral rinse (BIOTENE) LIQD   Apoaequorin (PREVAGEN PO)   Artificial Saliva (BIOTENE ORALBALANCE DRY MOUTH) GEL   azaTHIOprine (IMURAN) 50 MG tablet   Calcium Carb-Cholecalciferol (CALCIUM 600+D3 PO)   Camphor-Menthol-Methyl Sal (SALONPAS EX)   Cholecalciferol (VITAMIN D3) 50 MCG (2000 UT) capsule   Dentifrices (BIOTENE DRY MOUTH GENTLE) PSTE   furosemide (LASIX) 20 MG tablet   hydrochlorothiazide (HYDRODIURIL) 25 MG tablet   MAGNESIUM PO   Multiple Vitamin (MULTIVITAMIN WITH MINERALS) TABS tablet   naloxone (NARCAN) nasal spray 4 mg/0.1 mL   nitroGLYCERIN (NITROSTAT) 0.4 MG SL tablet   nortriptyline (PAMELOR) 50 MG capsule   omeprazole (PRILOSEC) 40 MG capsule   oxyCODONE (ROXICODONE) 15 MG immediate release tablet   phentermine (ADIPEX-P) 37.5 MG tablet   pilocarpine (SALAGEN) 5 MG tablet   Polyethyl Glycol-Propyl Glycol (SYSTANE ULTRA OP)   potassium chloride SA (KLOR-CON) 20 MEQ tablet   predniSONE (DELTASONE) 10 MG tablet   pregabalin (LYRICA) 200 MG capsule   PREMARIN vaginal cream   tiZANidine (ZANAFLEX) 2 MG tablet   No current facility-administered medications for this encounter.    If no changes, I anticipate pt can proceed with surgery as scheduled.   Rica Mast, PhD, FNP-BC [facility]/Anesthesiology Phone: [phone] 07/04/2021 10:46 AM

## 2021-07-04 NOTE — Anesthesia Preprocedure Evaluation (Addendum)
Anesthesia Evaluation    Reviewed: Allergy & Precautions, Patient's Chart, lab work & pertinent test results  Airway        Dental   Pulmonary former smoker,  Quit smoking 2000          Cardiovascular hypertension, Pt. on medications +CHF (grade 1 diastolic dysfunction)    Echo 2035: - Left ventricle: The cavity size was normal. Wall thickness was  normal. Systolic function was vigorous. The estimated ejection  fraction was in the range of 65% to 70%. Wall motion was normal;  there were no regional wall motion abnormalities. Doppler  parameters are consistent with abnormal left ventricular  relaxation (grade 1 diastolic dysfunction).  - Pulmonary arteries: PA peak pressure: 33 mm Hg (S).  - Impressions: No evidence of RV strain or PAH.   Echo 2021: 1. Normal coronaries with question forked ostium to RCA  2. EF 60-65% 3. Normal hemodynamics with no evidence of PAH    Neuro/Psych negative neurological ROS  negative psych ROS   GI/Hepatic Neg liver ROS, GERD  Medicated and Controlled,S/p roux en y   Endo/Other  diabetes (pre-diabetic )Morbid obesityBMI 42  Renal/GU negative Renal ROS  negative genitourinary   Musculoskeletal  (+) Arthritis , Osteoarthritis,  Fibromyalgia -Mixed connective tissue disease   Abdominal   Peds  Hematology negative hematology ROS (+)   Anesthesia Other Findings   Reproductive/Obstetrics negative OB ROS                            Anesthesia Physical Anesthesia Plan  ASA: 3  Anesthesia Plan: General   Post-op Pain Management: Tylenol PO (pre-op)*   Induction: Intravenous  PONV Risk Score and Plan: 3 and Ondansetron, Dexamethasone, Midazolam and Treatment may vary due to age or medical condition  Airway Management Planned: LMA  Additional Equipment: None  Intra-op Plan:   Post-operative Plan: Extubation in OR  Informed Consent:   Plan  Discussed with:   Anesthesia Plan Comments: ( Case cancelled in preop- pt had coffee w/ cream 1h ago)      Anesthesia Quick Evaluation

## 2021-07-08 LAB — COMPLETE METABOLIC PANEL WITH GFR
AG Ratio: 1.3 (calc) (ref 1.0–2.5)
ALT: 9 U/L (ref 6–29)
AST: 16 U/L (ref 10–35)
Albumin: 3.8 g/dL (ref 3.6–5.1)
Alkaline phosphatase (APISO): 64 U/L (ref 37–153)
BUN: 9 mg/dL (ref 7–25)
CO2: 33 mmol/L — ABNORMAL HIGH (ref 20–32)
Calcium: 8.9 mg/dL (ref 8.6–10.4)
Chloride: 103 mmol/L (ref 98–110)
Creat: 0.74 mg/dL (ref 0.50–1.03)
Globulin: 2.9 g/dL (calc) (ref 1.9–3.7)
Glucose, Bld: 92 mg/dL (ref 65–99)
Potassium: 3.6 mmol/L (ref 3.5–5.3)
Sodium: 141 mmol/L (ref 135–146)
Total Bilirubin: 0.5 mg/dL (ref 0.2–1.2)
Total Protein: 6.7 g/dL (ref 6.1–8.1)
eGFR: 95 mL/min/{1.73_m2} (ref >=60)

## 2021-07-08 LAB — RNP ANTIBODY: Ribonucleic Protein(ENA) Antibody, IgG: 1.2 AI — AB

## 2021-07-08 LAB — CBC WITH DIFFERENTIAL/PLATELET
Absolute Monocytes: 647 cells/uL (ref 200–950)
Basophils Absolute: 22 cells/uL (ref 0–200)
Basophils Relative: 0.5 %
Eosinophils Absolute: 132 cells/uL (ref 15–500)
Eosinophils Relative: 3 %
HCT: 38.7 % (ref 35.0–45.0)
Hemoglobin: 12.1 g/dL (ref 11.7–15.5)
Lymphs Abs: 1364 cells/uL (ref 850–3900)
MCH: 25.4 pg — ABNORMAL LOW (ref 27.0–33.0)
MCHC: 31.3 g/dL — ABNORMAL LOW (ref 32.0–36.0)
MCV: 81.3 fL (ref 80.0–100.0)
MPV: 10 fL (ref 7.5–12.5)
Monocytes Relative: 14.7 %
Neutro Abs: 2235 cells/uL (ref 1500–7800)
Neutrophils Relative %: 50.8 %
Platelets: 307 10*3/uL (ref 140–400)
RBC: 4.76 10*6/uL (ref 3.80–5.10)
RDW: 15.7 % — ABNORMAL HIGH (ref 11.0–15.0)
Total Lymphocyte: 31 %
WBC: 4.4 10*3/uL (ref 3.8–10.8)

## 2021-07-08 LAB — PROTEIN ELECTROPHORESIS, SERUM, WITH REFLEX
Albumin ELP: 3.7 g/dL — ABNORMAL LOW (ref 3.8–4.8)
Alpha 1: 0.3 g/dL (ref 0.2–0.3)
Alpha 2: 0.7 g/dL (ref 0.5–0.9)
Beta 2: 0.4 g/dL (ref 0.2–0.5)
Beta Globulin: 0.4 g/dL (ref 0.4–0.6)
Gamma Globulin: 1.2 g/dL (ref 0.8–1.7)
Total Protein: 6.7 g/dL (ref 6.1–8.1)

## 2021-07-08 LAB — RHEUMATOID FACTOR: Rheumatoid fact SerPl-aCnc: <14 IU/mL (ref <14)

## 2021-07-08 LAB — C3 AND C4
C3 Complement: 123 mg/dL (ref 83–193)
C4 Complement: 32 mg/dL (ref 15–57)

## 2021-07-08 LAB — ANTI-NUCLEAR AB-TITER (ANA TITER): ANA Titer 1: 1:320 {titer} — ABNORMAL HIGH

## 2021-07-08 LAB — IFE INTERPRETATION: Immunofix Electr Int: NOT DETECTED

## 2021-07-08 LAB — ANA: Anti Nuclear Antibody (ANA): POSITIVE — AB

## 2021-07-08 LAB — ANTI-DNA ANTIBODY, DOUBLE-STRANDED: ds DNA Ab: 1 IU/mL

## 2021-07-08 LAB — SJOGRENS SYNDROME-A EXTRACTABLE NUCLEAR ANTIBODY: SSA (Ro) (ENA) Antibody, IgG: 3.9 AI — AB

## 2021-07-08 LAB — SEDIMENTATION RATE: Sed Rate: 17 mm/h (ref 0–30)

## 2021-07-08 NOTE — Progress Notes (Signed)
IFE did not reveal any monoclonal proteins.

## 2021-07-08 NOTE — H&P (Signed)
Holly Davidson is an 56 y.o. female.   °Chief Complaint: left knee pain °HPI: Holly Davidson is here today for follow-up of her left knee.  The injection we gave her last visit helped for about a day or 2 and then unfortunately her pain returned.  She is a sharp catching sensation in her knee.  She states she was walking down her hallway the day when she had to get her grandson to help her continue to walk as her knee started to catch on her and she cannot put any weight on it.  She is quite miserable.  At this point she is ready to consider a knee arthroscopy due to her pain.  ° °MRI:  I reviewed an MRI scan films and report of a study done on the canopy system from 04/23/21 of the left knee demonstrate some mild patellofemoral osteoarthritis.  There is also a focal tear of the lateral meniscus anterior horn extending to the anterior root attachment site. 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  ° Procedure: LAPAROSCOPIC ROUX-EN-Y GASTRIC BYPASS WITH UPPER  ENDOSCOPY;  Surgeon: David Newman, MD;  Location[facility];  Service: General;  Laterality: N/A;  ° right foot surgery    ° RIGHT/LEFT HEART CATH AND CORONARY ANGIOGRAPHY N/A 07/01/2019  ° Procedure: RIGHT/LEFT HEART CATH AND CORONARY ANGIOGRAPHY;  Surgeon: Bensimhon, Daniel R, MD;  [facility];  Service: Cardiovascular;  Laterality: N/A;  ° TUBAL LIGATION    ° ° °Family History  °Problem Relation Age of Onset  ° Pulmonary embolism Mother   ° Cancer Father   °     colon  ° COPD Other   ° Hypertension Other   ° Hyperlipidemia Other   ° Stroke Other   ° Diabetes Other   ° Cancer Other   ° Heart disease Other   ° Rheum arthritis Sister   ° Autoimmune disease Daughter   ° Graves' disease Son   ° °Social History:  reports that she quit smoking about 22 years ago. Her smoking use included cigarettes. She has never used smokeless tobacco. She reports that she does not drink alcohol and does not use drugs. ° °Allergies: No Known Allergies ° °No medications prior to admission.  ° ° °No results found for this or any previous   visit (from the past 48 hour(s)). No results found.  Review of Systems  Musculoskeletal:  Positive for arthralgias.       Left knee pain  All other systems reviewed and are negative.  There were no vitals taken for this visit. Physical Exam Constitutional:      Appearance: Normal appearance.  HENT:     Head: Normocephalic and atraumatic.     Nose: Nose normal.     Mouth/Throat:     Mouth: Mucous membranes are moist.     Pharynx: Oropharynx is clear.  Eyes:     Extraocular Movements: Extraocular movements intact.  Cardiovascular:     Rate and Rhythm: Normal rate and regular rhythm.  Pulmonary:     Effort: Pulmonary effort is normal.  Abdominal:     Palpations: Abdomen is soft.  Musculoskeletal:     Cervical back: Normal range of motion.     Comments: Examination left knee shows range of motion from 0-115 of flexion.  1+ crepitation.  No effusion.  She is tender  palpation along her lateral joint line.  Her ligaments are stable.  Her calf is soft and nontender.  She is neurovascularly intact distally.  Skin:    General: Skin is warm and dry.  Neurological:     General: No focal deficit present.     Mental Status: She is alert and oriented to person, place, and time.  Psychiatric:        Mood and Affect: Mood normal.        Behavior: Behavior normal.        Thought Content: Thought content normal.        Judgment: Judgment normal.     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  Plan: Clarisse Gouge did get short-term relief from the cortisone shot in her knee.  Based on her MRI scan from 2022 she is got some left knee patellofemoral DJD but also a lateral meniscus tear.  At this point we elected to discuss a knee arthroscopy.  I think we can make her better but not brand-new. I reviewed risks of anesthesia and infection as well as potential for DVT related to a knee arthroscopy.  I've stressed the importance of some postoperative physical therapy to optimize results and we will try to set up an appointment.  Two to four  weeks for recovery would be typical but that is a little variable.    Ginger Organ Deveion Denz, PA-C 07/08/2021, 1:23 PM

## 2021-07-08 NOTE — H&P (View-Only) (Signed)
Holly Davidson is an 56 y.o. female.   Chief Complaint: left knee pain HPI: Holly Davidson is here today for follow-up of her left knee.  The injection we gave her last visit helped for about a day or 2 and then unfortunately her pain returned.  She is a sharp catching sensation in her knee.  She states she was walking down her hallway the day when she had to get her grandson to help her continue to walk as her knee started to catch on her and she cannot put any weight on it.  She is quite miserable.  At this point she is ready to consider a knee arthroscopy due to her pain.   MRI:  I reviewed an MRI scan films and report of a study done on the canopy system from 04/23/21 of the left knee demonstrate some mild patellofemoral osteoarthritis.  There is also a focal tear of the lateral meniscus anterior horn extending to the anterior root attachment site.  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 rash   Lupus (HCC)    Lupus arthritis (HCC)    MCTD (mixed connective tissue disease) (HCC) 03/28/2016   + ANA, + Ro, + RNP   Osteoarthritis of both hands 03/28/2016   Osteoarthritis of both knees 03/28/2016   Moderate    Post-menopausal atrophic vaginitis    Pre-diabetes     Past Surgical History:  Procedure Laterality Date   ABDOMINAL HYSTERECTOMY     BREATH TEK H PYLORI N/A 09/14/2013   Procedure: BREATH TEK H PYLORI;  Surgeon: Kandis [facility], MD;  Location: [facility];  Service: General;  Laterality: N/A;   CESAREAN SECTION     GASTRIC ROUX-EN-Y N/A 03/14/2014   Procedure: LAPAROSCOPIC ROUX-EN-Y GASTRIC BYPASS WITH UPPER  ENDOSCOPY;  Surgeon: Ovidio Kin, MD;  Location: [facility];  Service: General;  Laterality: N/A;   right foot surgery     RIGHT/LEFT HEART CATH AND CORONARY ANGIOGRAPHY N/A 07/01/2019   Procedure: RIGHT/LEFT HEART CATH AND CORONARY ANGIOGRAPHY;  Surgeon: Dolores Patty, MD;  Location: [facility];  Service: Cardiovascular;  Laterality: N/A;   TUBAL LIGATION      Family History  Problem Relation Age of Onset   Pulmonary embolism Mother    Cancer Father        colon   COPD Other    Hypertension Other    Hyperlipidemia Other    Stroke Other    Diabetes Other    Cancer Other    Heart disease Other    Rheum arthritis Sister    Autoimmune disease Daughter    Luiz Blare' disease Son    Social History:  reports that she quit smoking about 22 years ago. Her smoking use included cigarettes. She has never used smokeless tobacco. She reports that she does not drink alcohol and does not use drugs.  Allergies: No Known Allergies  No medications prior to admission.    No results found for this or any previous  visit (from the past 48 hour(s)). No results found.  Review of Systems  Musculoskeletal:  Positive for arthralgias.       Left knee pain  All other systems reviewed and are negative.  There were no vitals taken for this visit. Physical Exam Constitutional:      Appearance: Normal appearance.  HENT:     Head: Normocephalic and atraumatic.     Nose: Nose normal.     Mouth/Throat:     Mouth: Mucous membranes are moist.     Pharynx: Oropharynx is clear.  Eyes:     Extraocular Movements: Extraocular movements intact.  Cardiovascular:     Rate and Rhythm: Normal rate and regular rhythm.  Pulmonary:     Effort: Pulmonary effort is normal.  Abdominal:     Palpations: Abdomen is soft.  Musculoskeletal:     Cervical back: Normal range of motion.     Comments: Examination left knee shows range of motion from 0-115 of flexion.  1+ crepitation.  No effusion.  She is tender  palpation along her lateral joint line.  Her ligaments are stable.  Her calf is soft and nontender.  She is neurovascularly intact distally.  Skin:    General: Skin is warm and dry.  Neurological:     General: No focal deficit present.     Mental Status: She is alert and oriented to person, place, and time.  Psychiatric:        Mood and Affect: Mood normal.        Behavior: Behavior normal.        Thought Content: Thought content normal.        Judgment: Judgment normal.     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  Plan: Holly Davidson did get short-term relief from the cortisone shot in her knee.  Based on her MRI scan from 2022 she is got some left knee patellofemoral DJD but also a lateral meniscus tear.  At this point we elected to discuss a knee arthroscopy.  I think we can make her better but not brand-new. I reviewed risks of anesthesia and infection as well as potential for DVT related to a knee arthroscopy.  I've stressed the importance of some postoperative physical therapy to optimize results and we will try to set up an appointment.  Two to four  weeks for recovery would be typical but that is a little variable.    Ginger Organ Lief Palmatier, PA-C 07/08/2021, 1:23 PM

## 2021-07-08 NOTE — Progress Notes (Signed)
ANA remains positive but is a lower titer.  Ro remains positive-stable.  RNP remains positive but is a lower titer.  dsDNA is negative.  Labs are not consistent with a flare at this time.

## 2021-07-09 ENCOUNTER — Ambulatory Visit (HOSPITAL_COMMUNITY)
Admission: RE | Admit: 2021-07-09 | Discharge: 2021-07-09 | Disposition: A | Discharge status: Home / Self Care | Payer: Medicare Other | Source: Ambulatory Visit | Attending: Orthopaedic Surgery | Admitting: Orthopaedic Surgery

## 2021-07-09 ENCOUNTER — Encounter (HOSPITAL_COMMUNITY)
Admission: RE | Disposition: A | Discharge status: Home / Self Care | Payer: Self-pay | Source: Ambulatory Visit | Attending: Orthopaedic Surgery

## 2021-07-09 ENCOUNTER — Encounter (HOSPITAL_COMMUNITY): Payer: Self-pay | Admitting: Orthopaedic Surgery

## 2021-07-09 DIAGNOSIS — S83282A Other tear of lateral meniscus, current injury, left knee, initial encounter: Secondary | ICD-10-CM | POA: Diagnosis not present

## 2021-07-09 DIAGNOSIS — M1712 Unilateral primary osteoarthritis, left knee: Secondary | ICD-10-CM | POA: Insufficient documentation

## 2021-07-09 DIAGNOSIS — Z538 Procedure and treatment not carried out for other reasons: Secondary | ICD-10-CM | POA: Insufficient documentation

## 2021-07-09 DIAGNOSIS — X58XXXA Exposure to other specified factors, initial encounter: Secondary | ICD-10-CM | POA: Diagnosis not present

## 2021-07-09 DIAGNOSIS — M25562 Pain in left knee: Secondary | ICD-10-CM | POA: Diagnosis present

## 2021-07-09 SURGERY — ARTHROSCOPY, KNEE
Anesthesia: Choice | Site: Knee | Laterality: Left

## 2021-07-09 MED ORDER — CEFAZOLIN SODIUM-DEXTROSE 2-4 GM/100ML-% IV SOLN
2.0000 g | INTRAVENOUS | Status: DC
Start: 2021-07-09 — End: 2021-07-09
  Filled 2021-07-09: qty 100

## 2021-07-09 MED ORDER — CHLORHEXIDINE GLUCONATE 0.12 % MT SOLN: 15.0000 mL | Freq: Once | OROMUCOSAL | Status: DC

## 2021-07-09 MED ORDER — ORAL CARE MOUTH RINSE: 15.0000 mL | Freq: Once | OROMUCOSAL | Status: DC

## 2021-07-09 MED ORDER — LACTATED RINGERS IV SOLN: INTRAVENOUS | Status: DC

## 2021-07-09 MED ORDER — ACETAMINOPHEN 500 MG PO TABS
1000.0000 mg | ORAL_TABLET | Freq: Once | ORAL | Status: DC
  Filled 2021-07-09: qty 2

## 2021-07-09 SURGICAL SUPPLY — 31 items
BAG COUNTER SPONGE SURGICOUNT (BAG) ×2 IMPLANT
BLADE EXCALIBUR 4.0X13 (MISCELLANEOUS) IMPLANT
BNDG ELASTIC 6X5.8 VLCR STR LF (GAUZE/BANDAGES/DRESSINGS) ×2 IMPLANT
BNDG GAUZE ELAST 4 BULKY (GAUZE/BANDAGES/DRESSINGS) ×2 IMPLANT
BURR OVAL 8 FLU 4.0X13 (MISCELLANEOUS) ×2 IMPLANT
COVER SURGICAL LIGHT HANDLE (MISCELLANEOUS) IMPLANT
DISSECTOR 3.5MM X 13CM (MISCELLANEOUS) ×2 IMPLANT
DRAPE ARTHROSCOPY W/POUCH 114 (DRAPES) ×2 IMPLANT
DRAPE SHEET LG 3/4 BI-LAMINATE (DRAPES) ×2 IMPLANT
DRAPE U-SHAPE 47X51 STRL (DRAPES) ×2 IMPLANT
DRSG EMULSION OIL 3X3 NADH (GAUZE/BANDAGES/DRESSINGS) ×2 IMPLANT
DRSG PAD ABDOMINAL 8X10 ST (GAUZE/BANDAGES/DRESSINGS) ×2 IMPLANT
DURAPREP 26ML APPLICATOR (WOUND CARE) ×4 IMPLANT
GAUZE 4X4 16PLY ~~LOC~~+RFID DBL (SPONGE) ×2 IMPLANT
GAUZE SPONGE 4X4 12PLY STRL (GAUZE/BANDAGES/DRESSINGS) ×2 IMPLANT
GLOVE SRG 8 PF TXTR STRL LF DI (GLOVE) ×2 IMPLANT
GLOVE SURG ENC MOIS LTX SZ8 (GLOVE) ×4 IMPLANT
GLOVE SURG UNDER POLY LF SZ8 (GLOVE) ×4
GOWN STRL REUS W/ TWL XL LVL3 (GOWN DISPOSABLE) ×2 IMPLANT
GOWN STRL REUS W/TWL XL LVL3 (GOWN DISPOSABLE) ×4
KIT BASIN OR (CUSTOM PROCEDURE TRAY) ×2 IMPLANT
MANIFOLD NEPTUNE II (INSTRUMENTS) ×2 IMPLANT
NEEDLE HYPO 22GX1.5 SAFETY (NEEDLE) ×2 IMPLANT
NEEDLE SPNL 18GX3.5 QUINCKE PK (NEEDLE) IMPLANT
PACK ARTHROSCOPY WL (CUSTOM PROCEDURE TRAY) ×2 IMPLANT
PAD ARMBOARD 7.5X6 YLW CONV (MISCELLANEOUS) ×4 IMPLANT
PENCIL SMOKE EVACUATOR (MISCELLANEOUS) IMPLANT
SYR CONTROL 10ML LL (SYRINGE) ×2 IMPLANT
TOWEL OR 17X26 10 PK STRL BLUE (TOWEL DISPOSABLE) ×2 IMPLANT
TUBING ARTHROSCOPY IRRIG 16FT (MISCELLANEOUS) ×2 IMPLANT
WATER STERILE IRR 1000ML POUR (IV SOLUTION) ×2 IMPLANT

## 2021-07-09 NOTE — Progress Notes (Signed)
Patient had coffee with cream and sugar at 0915 today.  Surgery being canceled because surgeon can't accommodate patient for surgery later this afternoon. Patient notified and Dr.Finucane spoke with patient.

## 2021-07-11 ENCOUNTER — Other Ambulatory Visit: Payer: Self-pay

## 2021-07-11 NOTE — Progress Notes (Signed)
Holly Davidson made aware to arrive at 7:15 AM on 07/16/21. Denies any changes to medical history, medications, and allergies. Reviewed pre op instructions, she verbalized understanding at this time.

## 2021-07-16 ENCOUNTER — Encounter (HOSPITAL_COMMUNITY)
Admission: RE | Disposition: A | Discharge status: Home / Self Care | Payer: Self-pay | Source: Home / Self Care | Attending: Orthopaedic Surgery

## 2021-07-16 ENCOUNTER — Ambulatory Visit (HOSPITAL_COMMUNITY)
Admission: RE | Admit: 2021-07-16 | Discharge: 2021-07-16 | Disposition: A | Discharge status: Home / Self Care | Payer: Medicare Other | Attending: Orthopaedic Surgery | Admitting: Orthopaedic Surgery

## 2021-07-16 ENCOUNTER — Ambulatory Visit (HOSPITAL_COMMUNITY): Payer: Medicare Other | Admitting: Anesthesiology

## 2021-07-16 ENCOUNTER — Encounter: Payer: Self-pay | Admitting: *Deleted

## 2021-07-16 ENCOUNTER — Ambulatory Visit (HOSPITAL_BASED_OUTPATIENT_CLINIC_OR_DEPARTMENT_OTHER): Payer: Medicare Other | Admitting: Anesthesiology

## 2021-07-16 ENCOUNTER — Encounter (HOSPITAL_COMMUNITY): Payer: Self-pay | Admitting: Orthopaedic Surgery

## 2021-07-16 DIAGNOSIS — M797 Fibromyalgia: Secondary | ICD-10-CM | POA: Diagnosis not present

## 2021-07-16 DIAGNOSIS — S83282A Other tear of lateral meniscus, current injury, left knee, initial encounter: Secondary | ICD-10-CM | POA: Insufficient documentation

## 2021-07-16 DIAGNOSIS — Z6841 Body Mass Index (BMI) 40.0 and over, adult: Secondary | ICD-10-CM | POA: Insufficient documentation

## 2021-07-16 DIAGNOSIS — K219 Gastro-esophageal reflux disease without esophagitis: Secondary | ICD-10-CM | POA: Insufficient documentation

## 2021-07-16 DIAGNOSIS — Z9884 Bariatric surgery status: Secondary | ICD-10-CM | POA: Diagnosis not present

## 2021-07-16 DIAGNOSIS — M2342 Loose body in knee, left knee: Secondary | ICD-10-CM | POA: Diagnosis not present

## 2021-07-16 DIAGNOSIS — R7303 Prediabetes: Secondary | ICD-10-CM | POA: Insufficient documentation

## 2021-07-16 DIAGNOSIS — M1712 Unilateral primary osteoarthritis, left knee: Secondary | ICD-10-CM | POA: Insufficient documentation

## 2021-07-16 DIAGNOSIS — I10 Essential (primary) hypertension: Secondary | ICD-10-CM | POA: Diagnosis not present

## 2021-07-16 DIAGNOSIS — S83207A Unspecified tear of unspecified meniscus, current injury, left knee, initial encounter: Secondary | ICD-10-CM

## 2021-07-16 DIAGNOSIS — M351 Other overlap syndromes: Secondary | ICD-10-CM | POA: Insufficient documentation

## 2021-07-16 DIAGNOSIS — Z87891 Personal history of nicotine dependence: Secondary | ICD-10-CM | POA: Diagnosis not present

## 2021-07-16 DIAGNOSIS — M25562 Pain in left knee: Secondary | ICD-10-CM

## 2021-07-16 DIAGNOSIS — Y9301 Activity, walking, marching and hiking: Secondary | ICD-10-CM | POA: Insufficient documentation

## 2021-07-16 HISTORY — PX: KNEE ARTHROSCOPY: SHX127

## 2021-07-16 SURGERY — ARTHROSCOPY, KNEE
Anesthesia: General | Site: Knee | Laterality: Left

## 2021-07-16 MED ORDER — OXYCODONE HCL 5 MG PO TABS
ORAL_TABLET | ORAL | Status: AC
  Filled 2021-07-16: qty 1

## 2021-07-16 MED ORDER — LIDOCAINE 2% (20 MG/ML) 5 ML SYRINGE
INTRAMUSCULAR | Status: DC | PRN
Start: 2021-07-16 — End: 2021-07-16
  Administered 2021-07-16: 100 mg via INTRAVENOUS

## 2021-07-16 MED ORDER — FENTANYL CITRATE PF 50 MCG/ML IJ SOSY
50.0000 ug | PREFILLED_SYRINGE | INTRAMUSCULAR | Status: DC
Start: 2021-07-16 — End: 2021-07-16

## 2021-07-16 MED ORDER — MIDAZOLAM HCL 5 MG/5ML IJ SOLN
INTRAMUSCULAR | Status: DC | PRN
Start: 2021-07-16 — End: 2021-07-16
  Administered 2021-07-16: 2 mg via INTRAVENOUS

## 2021-07-16 MED ORDER — SODIUM CHLORIDE 0.9 % IR SOLN
Status: DC | PRN
  Administered 2021-07-16: 1000 mL

## 2021-07-16 MED ORDER — BUPIVACAINE-EPINEPHRINE 0.5% -1:200000 IJ SOLN
INTRAMUSCULAR | Status: DC | PRN
  Administered 2021-07-16: 20 mL

## 2021-07-16 MED ORDER — PROCHLORPERAZINE EDISYLATE 10 MG/2ML IJ SOLN: 10.0000 mg | Freq: Once | INTRAMUSCULAR | Status: DC | PRN

## 2021-07-16 MED ORDER — KETOROLAC TROMETHAMINE 30 MG/ML IJ SOLN
INTRAMUSCULAR | Status: AC
  Filled 2021-07-16: qty 1

## 2021-07-16 MED ORDER — OXYCODONE HCL 5 MG PO TABS
5.0000 mg | ORAL_TABLET | Freq: Once | ORAL | Status: AC | PRN
  Administered 2021-07-16: 5 mg via ORAL

## 2021-07-16 MED ORDER — EPINEPHRINE (ANAPHYLAXIS) 1 MG/ML IJ SOLN
INTRAMUSCULAR | Status: DC | PRN
  Administered 2021-07-16: 1 mL

## 2021-07-16 MED ORDER — CEFAZOLIN SODIUM-DEXTROSE 2-3 GM-%(50ML) IV SOLR
INTRAVENOUS | Status: DC | PRN
  Administered 2021-07-16: 2 g via INTRAVENOUS

## 2021-07-16 MED ORDER — EPINEPHRINE PF 1 MG/ML IJ SOLN
INTRAMUSCULAR | Status: AC
  Filled 2021-07-16: qty 1

## 2021-07-16 MED ORDER — MIDAZOLAM HCL 2 MG/2ML IJ SOLN: 1.0000 mg | INTRAMUSCULAR | Status: DC

## 2021-07-16 MED ORDER — FENTANYL CITRATE PF 50 MCG/ML IJ SOSY
25.0000 ug | PREFILLED_SYRINGE | INTRAMUSCULAR | Status: DC | PRN
  Administered 2021-07-16 (×3): 50 ug via INTRAVENOUS

## 2021-07-16 MED ORDER — OXYCODONE HCL 5 MG/5ML PO SOLN: 5.0000 mg | Freq: Once | ORAL | Status: AC | PRN

## 2021-07-16 MED ORDER — OXYCODONE HCL 5 MG PO TABS: 5.0000 mg | ORAL_TABLET | Freq: Four times a day (QID) | ORAL | 0 refills | Status: DC | PRN

## 2021-07-16 MED ORDER — HYDROMORPHONE HCL 1 MG/ML IJ SOLN
INTRAMUSCULAR | Status: AC
  Filled 2021-07-16: qty 1

## 2021-07-16 MED ORDER — HYDROMORPHONE HCL 1 MG/ML IJ SOLN
0.2500 mg | INTRAMUSCULAR | Status: DC | PRN
  Administered 2021-07-16 (×2): 0.5 mg via INTRAVENOUS

## 2021-07-16 MED ORDER — ACETAMINOPHEN 500 MG PO TABS
1000.0000 mg | ORAL_TABLET | Freq: Once | ORAL | Status: AC
  Administered 2021-07-16: 1000 mg via ORAL
  Filled 2021-07-16: qty 2

## 2021-07-16 MED ORDER — FENTANYL CITRATE (PF) 100 MCG/2ML IJ SOLN
INTRAMUSCULAR | Status: DC | PRN
  Administered 2021-07-16 (×2): 50 ug via INTRAVENOUS

## 2021-07-16 MED ORDER — BUPIVACAINE-EPINEPHRINE (PF) 0.5% -1:200000 IJ SOLN
INTRAMUSCULAR | Status: AC
  Filled 2021-07-16: qty 30

## 2021-07-16 MED ORDER — PROPOFOL 10 MG/ML IV BOLUS
INTRAVENOUS | Status: DC | PRN
  Administered 2021-07-16: 200 mg via INTRAVENOUS

## 2021-07-16 MED ORDER — DEXAMETHASONE SODIUM PHOSPHATE 10 MG/ML IJ SOLN
INTRAMUSCULAR | Status: DC | PRN
  Administered 2021-07-16: 4 mg via INTRAVENOUS

## 2021-07-16 MED ORDER — FENTANYL CITRATE PF 50 MCG/ML IJ SOSY
PREFILLED_SYRINGE | INTRAMUSCULAR | Status: AC
  Filled 2021-07-16: qty 2

## 2021-07-16 MED ORDER — ONDANSETRON HCL 4 MG/2ML IJ SOLN
INTRAMUSCULAR | Status: DC | PRN
  Administered 2021-07-16: 4 mg via INTRAVENOUS

## 2021-07-16 MED ORDER — KETOROLAC TROMETHAMINE 30 MG/ML IJ SOLN
30.0000 mg | Freq: Once | INTRAMUSCULAR | Status: AC | PRN
  Administered 2021-07-16: 30 mg via INTRAVENOUS

## 2021-07-16 MED ORDER — FENTANYL CITRATE (PF) 100 MCG/2ML IJ SOLN
INTRAMUSCULAR | Status: AC
  Filled 2021-07-16: qty 2

## 2021-07-16 MED ORDER — LACTATED RINGERS IV SOLN: INTRAVENOUS | Status: DC

## 2021-07-16 MED ORDER — FENTANYL CITRATE PF 50 MCG/ML IJ SOSY
PREFILLED_SYRINGE | INTRAMUSCULAR | Status: AC
  Filled 2021-07-16: qty 1

## 2021-07-16 MED ORDER — MIDAZOLAM HCL 2 MG/2ML IJ SOLN
INTRAMUSCULAR | Status: AC
  Filled 2021-07-16: qty 2

## 2021-07-16 SURGICAL SUPPLY — 38 items
BAG COUNTER SPONGE SURGICOUNT (BAG) ×1 IMPLANT
BAG SPNG CNTER NS LX DISP (BAG)
BLADE EXCALIBUR 4.0X13 (MISCELLANEOUS) IMPLANT
BNDG ELASTIC 6X5.8 VLCR STR LF (GAUZE/BANDAGES/DRESSINGS) ×2 IMPLANT
BNDG GAUZE ELAST 4 BULKY (GAUZE/BANDAGES/DRESSINGS) ×2 IMPLANT
BURR OVAL 8 FLU 4.0X13 (MISCELLANEOUS) ×2 IMPLANT
COVER SURGICAL LIGHT HANDLE (MISCELLANEOUS) IMPLANT
DISSECTOR 3.5MM X 13CM (MISCELLANEOUS) ×2 IMPLANT
DRAPE ARTHROSCOPY W/POUCH 114 (DRAPES) ×2 IMPLANT
DRAPE SHEET LG 3/4 BI-LAMINATE (DRAPES) ×2 IMPLANT
DRAPE U-SHAPE 47X51 STRL (DRAPES) ×2 IMPLANT
DRSG EMULSION OIL 3X3 NADH (GAUZE/BANDAGES/DRESSINGS) ×2 IMPLANT
DRSG PAD ABDOMINAL 8X10 ST (GAUZE/BANDAGES/DRESSINGS) ×3 IMPLANT
DURAPREP 26ML APPLICATOR (WOUND CARE) ×4 IMPLANT
FILTER STRAW (MISCELLANEOUS) ×1 IMPLANT
GAUZE 4X4 16PLY ~~LOC~~+RFID DBL (SPONGE) ×1 IMPLANT
GAUZE SPONGE 4X4 12PLY STRL (GAUZE/BANDAGES/DRESSINGS) ×2 IMPLANT
GLOVE SRG 8 PF TXTR STRL LF DI (GLOVE) ×2 IMPLANT
GLOVE SURG ENC MOIS LTX SZ8 (GLOVE) ×4 IMPLANT
GLOVE SURG UNDER POLY LF SZ8 (GLOVE) ×4
GOWN STRL REUS W/ TWL XL LVL3 (GOWN DISPOSABLE) ×2 IMPLANT
GOWN STRL REUS W/TWL XL LVL3 (GOWN DISPOSABLE) ×4
KIT BASIN OR (CUSTOM PROCEDURE TRAY) ×2 IMPLANT
MANIFOLD NEPTUNE II (INSTRUMENTS) ×2 IMPLANT
NDL SAFETY ECLIPSE 18X1.5 (NEEDLE) IMPLANT
NDL SPNL 18GX3.5 QUINCKE PK (NEEDLE) IMPLANT
NEEDLE HYPO 18GX1.5 SHARP (NEEDLE) ×2
NEEDLE HYPO 22GX1.5 SAFETY (NEEDLE) ×2 IMPLANT
NEEDLE SPNL 18GX3.5 QUINCKE PK (NEEDLE) IMPLANT
PACK ARTHROSCOPY WL (CUSTOM PROCEDURE TRAY) ×2 IMPLANT
PAD ARMBOARD 7.5X6 YLW CONV (MISCELLANEOUS) ×4 IMPLANT
PADDING CAST COTTON 6X4 STRL (CAST SUPPLIES) ×1 IMPLANT
PENCIL SMOKE EVACUATOR (MISCELLANEOUS) IMPLANT
SYR 3ML LL SCALE MARK (SYRINGE) ×1 IMPLANT
SYR CONTROL 10ML LL (SYRINGE) ×2 IMPLANT
TOWEL OR 17X26 10 PK STRL BLUE (TOWEL DISPOSABLE) ×2 IMPLANT
TUBING ARTHROSCOPY IRRIG 16FT (MISCELLANEOUS) ×2 IMPLANT
WATER STERILE IRR 1000ML POUR (IV SOLUTION) ×1 IMPLANT

## 2021-07-16 NOTE — Op Note (Signed)
NAME: Holly Davidson, Holly Davidson MEDICAL RECORD NO: [id number] ACCOUNT NO: [id number] DATE OF BIRTH: 1965/10/19 FACILITY: [facility] LOCATION: [facility] PHYSICIAN: Lubertha Basque. Jerl Santos, MD  Operative Report   DATE OF PROCEDURE: 07/16/2021  PREOPERATIVE DIAGNOSES:   1.  Left knee torn lateral meniscus. 2.  Left knee degenerative joint disease.  POSTOPERATIVE DIAGNOSES:   1.  Left knee torn lateral meniscus. 2.  Left knee degenerative joint disease.  PROCEDURES:   1.  Left knee partial lateral meniscectomy. 2.  Left knee abrasion chondroplasty, medial and patellofemoral. 3.  Left knee removal of loose bodies.  ANESTHESIA:  General.  ATTENDING SURGEON:  Lubertha Basque. Jerl Santos, MD.  ASSISTANTElodia Florence, PA.  INDICATIONS FOR PROCEDURE:  The patient is a 56 year old woman with a long history of left knee pain and swelling.  This has persisted despite various conservative measures.  By MRI scan, she has a torn lateral meniscus and some degenerative change,  mostly of patellofemoral.  She is offered an arthroscopy.  An informed operative consent was obtained after discussion of possible complications including, reaction to anesthesia and infection.  The need of eventually down the road for knee replacement  was also mentioned to the patient, but that would involve some weight loss as well.  DESCRIPTION OF PROCEDURE:  The patient was taken to the operating suite where general anesthetic was applied without difficulty.  She was positioned supine and prepped and draped in normal sterile fashion.  After the administration of preoperative IV  Kefzol and appropriate timeout, an arthroscopy of the left knee was performed through total of 2 portals.  Suprapatellar pouch was benign except for some cartilaginous loose bodies, which I removed.  The patellofemoral joint exhibited mostly grade 3 with  some small areas of grade 4 change and thorough chondroplasty was done.  In the medial compartment, she had no meniscal  injury, but did have some grade 3 and 4 change across a quarter-size area of the medial femoral condyle.  A chondroplasty was done  along with abrasion of bleeding bone in some small areas.  She had a fairly large loose body in the intercondylar notch, which I removed.  This was about a centimeter in diameter.  The lateral compartment did exhibit a middle horn lateral meniscus tear,  which was addressed with about a 5% partial lateral meniscectomy.  She had no degenerative changes in that compartment.  The knee was thoroughly irrigated, followed by placement of Marcaine with epinephrine and morphine.  Adaptic was placed over the  portals followed by dry gauze and a loose Coban wrap.  Estimated blood loss and intraoperative fluids, obtain from anesthesia records.  DISPOSITION:  The patient was extubated in the operating room and taken to recovery room in stable condition.  Plans were for her to go home same day and to follow up in the office in less than a week.  I will contact her by phone tonight.   CHR D: 07/16/2021 12:52:51 pm T: 07/16/2021 2:39:00 pm  JOB: [id number]/ [id number]

## 2021-07-16 NOTE — Anesthesia Procedure Notes (Signed)
Procedure Name: LMA Insertion Date/Time: 07/16/2021 12:12 PM Performed by: Ezekiel Ina, CRNA Pre-anesthesia Checklist: Patient identified, Emergency Drugs available, Suction available and Patient being monitored Patient Re-evaluated:Patient Re-evaluated prior to induction Oxygen Delivery Method: Circle system utilized Preoxygenation: Pre-oxygenation with 100% oxygen Induction Type: IV induction Ventilation: Mask ventilation without difficulty LMA: LMA inserted LMA Size: 4.0 Number of attempts: 1 Tube secured with: Tape Dental Injury: Teeth and Oropharynx as per pre-operative assessment

## 2021-07-16 NOTE — Transfer of Care (Signed)
Immediate Anesthesia Transfer of Care Note  Patient: Holly Davidson  Procedure(s) Performed: LEFT KNEE ARTHROSCOPY (Left: Knee)  Patient Location: PACU  Anesthesia Type:General  Level of Consciousness: drowsy and patient cooperative  Airway & Oxygen Therapy: Patient Spontanous Breathing and Patient connected to face mask oxygen  Post-op Assessment: Report given to RN and Post -op Vital signs reviewed and stable  Post vital signs: Reviewed and stable  Last Vitals:  Vitals Value Taken Time  BP    Temp    Pulse    Resp    SpO2      Last Pain:  Vitals:   07/16/21 1025  TempSrc:   PainSc: 5       Patients Stated Pain Goal: 3 (07/16/21 1025)  Complications: No notable events documented.

## 2021-07-16 NOTE — Anesthesia Preprocedure Evaluation (Addendum)
Anesthesia Evaluation  Patient identified by MRN, date of birth, ID band Patient awake    Reviewed: Allergy & Precautions, NPO status , Patient's Chart, lab work & pertinent test results  Airway Mallampati: III  TM Distance: >3 FB Neck ROM: Full    Dental  (+) Dental Advisory Given, Chipped,    Pulmonary former smoker,  Quit smoking 2000   Pulmonary exam normal        Cardiovascular hypertension, Pt. on medications Normal cardiovascular exam   '19 TTE - EF 65% to 70%. Grade 1 diastolic dysfunction. Pulmonary arteries: PA peak pressure: 33 mm Hg (S).   '21 Cath - 1. Normal coronaries with question forked ostium to RCA  2. EF 60-65% 3. Normal hemodynamics with no evidence of PAH     Neuro/Psych negative neurological ROS  negative psych ROS   GI/Hepatic Neg liver ROS, GERD  Medicated and Controlled, S/p roux en y   Endo/Other  Morbid obesity Pre-DM   Renal/GU negative Renal ROS  negative genitourinary   Musculoskeletal  (+) Arthritis , Osteoarthritis,  Fibromyalgia -Mixed connective tissue disease   Abdominal   Peds  Hematology  (+) Blood dyscrasia, anemia ,   Anesthesia Other Findings   Reproductive/Obstetrics negative OB ROS                           Anesthesia Physical  Anesthesia Plan  ASA: 3  Anesthesia Plan: General   Post-op Pain Management: Tylenol PO (pre-op)*   Induction: Intravenous  PONV Risk Score and Plan: 3 and Ondansetron, Dexamethasone, Midazolam and Treatment may vary due to age or medical condition  Airway Management Planned: LMA  Additional Equipment: None  Intra-op Plan:   Post-operative Plan: Extubation in OR  Informed Consent: I have reviewed the patients History and Physical, chart, labs and discussed the procedure including the risks, benefits and alternatives for the proposed anesthesia with the patient or authorized representative who has  indicated his/her understanding and acceptance.     Dental advisory given  Plan Discussed with: CRNA and Anesthesiologist  Anesthesia Plan Comments:        Anesthesia Quick Evaluation

## 2021-07-16 NOTE — Brief Op Note (Signed)
Holly Davidson [id number] 07/16/2021   PRE-OP DIAGNOSIS: left TLM and DJD  POST-OP DIAGNOSIS: same  PROCEDURE: left knee PLM and CP  ANESTHESIA: general  [name]   Dictation #:  [id number]

## 2021-07-16 NOTE — Interval H&P Note (Signed)
History and Physical Interval Note:  07/16/2021 11:15 AM  Holly Davidson  has presented today for surgery, with the diagnosis of LEFT KNEE MEDIAL MENISCUS TEAR.  The various methods of treatment have been discussed with the patient and family. After consideration of risks, benefits and other options for treatment, the patient has consented to  Procedure(s): LEFT KNEE ARTHROSCOPY (Left) as a surgical intervention.  The patient's history has been reviewed, patient examined, no change in status, stable for surgery.  I have reviewed the patient's chart and labs.  Questions were answered to the patient's satisfaction.     [name]

## 2021-07-17 ENCOUNTER — Encounter (HOSPITAL_COMMUNITY): Payer: Self-pay | Admitting: Orthopaedic Surgery

## 2021-07-17 NOTE — Anesthesia Postprocedure Evaluation (Signed)
Anesthesia Post Note  Patient: Holly Davidson  Procedure(s) Performed: LEFT KNEE ARTHROSCOPY/PARTIAL MENISECTOMY/CHONDROPLASTY (Left: Knee)     Patient location during evaluation: PACU Anesthesia Type: General Level of consciousness: awake and alert Pain management: pain level controlled Vital Signs Assessment: post-procedure vital signs reviewed and stable Respiratory status: spontaneous breathing, nonlabored ventilation and respiratory function stable Cardiovascular status: stable and blood pressure returned to baseline Anesthetic complications: no   No notable events documented.  Last Vitals:  Vitals:   07/16/21 1415 07/16/21 1432  BP: 134/82 (!) 135/97  Pulse: 67 67  Resp: 11 16  Temp:  36.6 C  SpO2: 95% 96%    Last Pain:  Vitals:   07/16/21 1432  TempSrc:   PainSc: [name]

## 2021-08-12 ENCOUNTER — Other Ambulatory Visit (HOSPITAL_COMMUNITY): Payer: Self-pay | Admitting: Orthopaedic Surgery

## 2021-08-12 ENCOUNTER — Other Ambulatory Visit: Payer: Self-pay

## 2021-08-12 ENCOUNTER — Ambulatory Visit (HOSPITAL_COMMUNITY)
Admission: RE | Admit: 2021-08-12 | Discharge: 2021-08-12 | Disposition: A | Discharge status: Home / Self Care | Payer: Medicare Other | Source: Ambulatory Visit | Attending: Cardiovascular Disease | Admitting: Cardiovascular Disease

## 2021-08-12 DIAGNOSIS — M7989 Other specified soft tissue disorders: Secondary | ICD-10-CM | POA: Diagnosis not present

## 2021-08-22 ENCOUNTER — Other Ambulatory Visit: Payer: Self-pay | Admitting: Physician Assistant

## 2021-08-22 NOTE — Telephone Encounter (Signed)
Next Visit: 10/03/2021 ? ?Last Visit: 07/03/2021 ? ?Last Fill: 04/16/2021 ? ?Dx: MCTD (mixed connective tissue disease) ([place]) + ANA, + Ro, + RNP ? ?Current Dose per office note on 07/03/2021:  pilocarpine 5 mg 2 to 3 tablets daily ? ?Okay to refill Pilocarpine?   ?

## 2021-08-22 NOTE — Telephone Encounter (Signed)
Next Visit: 10/03/2021 ? ?Last Visit: 07/03/2021 ? ?Last Fill: 04/16/2021 ? ?Dx: MCTD (mixed connective tissue disease) ([place]) + ANA, + Ro, + RNP  ? ?Current Dose per office note on 07/03/2021: pilocarpine 5 mg 2 to 3 tablets daily  ? ?Okay to refill Pilocarpine?  ? ?

## 2021-09-10 ENCOUNTER — Other Ambulatory Visit: Payer: Self-pay | Admitting: Physician Assistant

## 2021-09-10 NOTE — Telephone Encounter (Signed)
Next Visit: 10/03/2021 ?  ?Last Visit: 07/03/2021 ?  ?Last Fill: 07/03/2021 ? ?Dx: MCTD (mixed connective tissue disease) (HCC) + ANA, + Ro, + RNP  ?  ?Current Dose per office note on 07/03/2021: Imuran 50 mg 2 tablets by mouth daily ? ?Labs: 07/03/2021 Hgb 11.9, MCH 25.5, RDW 16.8, Potassium 3.0, Glucose 104, Calcium 8.8 ? ?Okay to refill Imuran?  ?

## 2021-09-19 NOTE — Progress Notes (Deleted)
Office Visit Note  Patient: Holly Davidson             Date of Birth: 13-Jun-1965           MRN: [id number]             PCP: Chiquita Loth, PA Referring: Chiquita Loth, PA Visit Date: 10/03/2021 Occupation: @GUAROCC @  Subjective:  No chief complaint on file.   History of Present Illness: Holly Davidson is a 55 y.o. female ***   Activities of Daily Living:  Patient reports morning stiffness for *** {minute/hour:19697}.   Patient {ACTIONS;DENIES/REPORTS:21021675::"Denies"} nocturnal pain.  Difficulty dressing/grooming: {ACTIONS;DENIES/REPORTS:21021675::"Denies"} Difficulty climbing stairs: {ACTIONS;DENIES/REPORTS:21021675::"Denies"} Difficulty getting out of chair: {ACTIONS;DENIES/REPORTS:21021675::"Denies"} Difficulty using hands for taps, buttons, cutlery, and/or writing: {ACTIONS;DENIES/REPORTS:21021675::"Denies"}  No Rheumatology ROS completed.   PMFS History:  Patient Active Problem List   Diagnosis Date Noted  . Abnormal EKG 07/28/2017  . Lupus erythematosus 07/28/2017  . Chest pain in adult 07/17/2017  . Essential hypertension 07/17/2017  . History of fibromyalgia 07/17/2017  . Other fatigue 12/18/2016  . Primary insomnia 12/18/2016  . Vitamin D deficiency 12/18/2016  . Former smoker 12/18/2016  . History of cholecystectomy 12/18/2016  . History of gastric bypass 12/18/2016  . Osteoarthritis of both hands 03/28/2016  . 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 MCTD (mixed connective tissue disease) (HCC) + ANA, + Ro, + RNP 03/28/2016  . Osteoarthritis of both knees 03/28/2016  . DDD (degenerative disc disease), lumbar 03/28/2016  . Fibromyalgia 03/28/2016  . ANA positive 10/26/2013  . Morbid obesity, Initial weight - 282, BMI - 42.7 09/09/2012    Past Medical History:  Diagnosis Date  . Anemia   . Arthritis   . Back pain    "OFF & ON"  . 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 DDD (degenerative disc disease), lumbar  03/28/2016  . Difficulty sleeping    occasional - takes med prn  . Dizziness    per patient   . Fibromyalgia   . Heart murmur   . Herniated lumbar intervertebral disc   . Hypertension   . Lupus (HCC)    "presents as joint pain, occ. rash  . Lupus (HCC)   . Lupus arthritis (HCC)   . MCTD (mixed connective tissue disease) (HCC) 03/28/2016   + ANA, + Ro, + RNP  . Osteoarthritis of both hands 03/28/2016  . Osteoarthritis of both knees 03/28/2016   Moderate   . Post-menopausal atrophic vaginitis   . Pre-diabetes     Family History  Problem Relation Age of Onset  . Pulmonary embolism Mother   . Cancer Father        colon  . COPD Other   . Hypertension Other   . Hyperlipidemia Other   . Stroke Other   . Diabetes Other   . Cancer Other   . Heart disease Other   . Rheum arthritis Sister   . Autoimmune disease Daughter   . Sonnia Strong' disease Son    Past Surgical History:  Procedure Laterality Date  . ABDOMINAL HYSTERECTOMY    . BREATH TEK H PYLORI N/A 09/14/2013   Procedure: BREATH TEK H PYLORI;  Surgeon: 09/16/2013, MD;  [facility];  Service: General;  Laterality: N/A;  . CESAREAN SECTION    . GASTRIC ROUX-EN-Y N/A 03/14/2014   Procedure: LAPAROSCOPIC ROUX-EN-Y GASTRIC BYPASS WITH UPPER ENDOSCOPY;  Surgeon: 03/16/2014, MD;  Location: [facility];  Service: General;  Laterality: N/A;  . KNEE  ARTHROSCOPY Left 07/16/2021   Procedure: LEFT KNEE ARTHROSCOPY/PARTIAL MENISECTOMY/CHONDROPLASTY;  Surgeon: Marcene Corning, MD;  Location: [facility];  Service: Orthopedics;  Laterality: Left;  . right foot surgery    . RIGHT/LEFT HEART CATH AND CORONARY ANGIOGRAPHY N/A 07/01/2019   Procedure: RIGHT/LEFT HEART CATH AND CORONARY ANGIOGRAPHY;  Surgeon: Dolores Patty, MD;  Location: [facility];  Service: Cardiovascular;  Laterality: N/A;  . TUBAL LIGATION     Social History   Social History Narrative   Right handed      Highest level of edu- Assoc      Has a 24 year  old son. Lives in 2 story home   Immunization History  Administered Date(s) Administered  . PFIZER(Purple Top)SARS-COV-2 Vaccination 10/01/2019, 10/22/2019, 03/19/2020  . Pneumococcal Polysaccharide-23 04/08/2018  . Zoster Recombinat (Shingrix) 04/07/2018, 07/28/2018     Objective: Vital Signs: There were no vitals taken for this visit.   Physical Exam   Musculoskeletal Exam: ***  CDAI Exam: CDAI Score: -- Patient Global: --; Provider Global: -- Swollen: --; Tender: -- Joint Exam 10/03/2021   No joint exam has been documented for this visit   There is currently no information documented on the homunculus. [name] and complete the homunculus joint exam.  Investigation: No additional findings.  Imaging: No results found.  Recent Labs: Lab Results  Component Value Date   WBC 4.5 07/03/2021   HGB 11.9 (L) 07/03/2021   PLT 307 07/03/2021   NA 139 07/03/2021   K 3.0 (L) 07/03/2021   CL 103 07/03/2021   CO2 29 07/03/2021   GLUCOSE 104 (H) 07/03/2021   BUN 9 07/03/2021   CREATININE 0.66 07/03/2021   BILITOT 0.5 07/03/2021   ALKPHOS 90 04/21/2018   AST 16 07/03/2021   ALT 9 07/03/2021   PROT 6.7 07/03/2021   PROT 6.7 07/03/2021   ALBUMIN 4.0 04/21/2018   CALCIUM 8.8 (L) 07/03/2021   GFRAA 85 05/22/2020    Speciality Comments: No specialty comments available.  Procedures:  No procedures performed Allergies: Patient has no known allergies.   Assessment / Plan:     Visit Diagnoses: No diagnosis found.  Orders: No orders of the defined types were placed in this encounter.  No orders of the defined types were placed in this encounter.   Face-to-face time spent with patient was *** minutes. Greater than 50% of time was spent in counseling and coordination of care.  Follow-Up Instructions: No follow-ups on file.   Ellen Henri, CMA  Note - This record has been created using Animal nutritionist.  Chart creation errors have been  sought, but may not always  have been located. Such creation errors do not reflect on  the standard of medical care.

## 2021-09-26 ENCOUNTER — Ambulatory Visit (HOSPITAL_COMMUNITY)
Admission: RE | Admit: 2021-09-26 | Discharge: 2021-09-26 | Disposition: A | Discharge status: Home / Self Care | Payer: Medicare Other | Source: Ambulatory Visit | Attending: Internal Medicine | Admitting: Internal Medicine

## 2021-09-26 ENCOUNTER — Encounter (HOSPITAL_COMMUNITY): Payer: Self-pay | Admitting: Internal Medicine

## 2021-09-26 VITALS — BP 138/78 | HR 85 | Wt 262.0 lb

## 2021-09-26 DIAGNOSIS — I509 Heart failure, unspecified: Secondary | ICD-10-CM | POA: Diagnosis not present

## 2021-09-26 DIAGNOSIS — R9431 Abnormal electrocardiogram [ECG] [EKG]: Secondary | ICD-10-CM | POA: Diagnosis present

## 2021-09-26 DIAGNOSIS — M329 Systemic lupus erythematosus, unspecified: Secondary | ICD-10-CM | POA: Insufficient documentation

## 2021-09-26 DIAGNOSIS — Z8249 Family history of ischemic heart disease and other diseases of the circulatory system: Secondary | ICD-10-CM | POA: Diagnosis not present

## 2021-09-26 DIAGNOSIS — Z87891 Personal history of nicotine dependence: Secondary | ICD-10-CM | POA: Diagnosis not present

## 2021-09-26 DIAGNOSIS — R079 Chest pain, unspecified: Secondary | ICD-10-CM | POA: Diagnosis not present

## 2021-09-26 DIAGNOSIS — I119 Hypertensive heart disease without heart failure: Secondary | ICD-10-CM | POA: Insufficient documentation

## 2021-09-26 DIAGNOSIS — M351 Other overlap syndromes: Secondary | ICD-10-CM | POA: Diagnosis not present

## 2021-09-26 MED ORDER — ROSUVASTATIN CALCIUM 10 MG PO TABS: 10.0000 mg | ORAL_TABLET | Freq: Every day | ORAL | 11 refills | Status: DC

## 2021-09-26 NOTE — Patient Instructions (Addendum)
Good to see you today! ? ?START Crestor 10 mg daily ? ?Your physician has requested that you have an echocardiogram. Echocardiography is a painless test that uses sound waves to create images of your heart. It provides your doctor with information about the size and shape of your heart and how well your heart?s chambers and valves are working. This procedure takes approximately one hour. There are no restrictions for this procedure. ? ?Your physician recommends that you schedule a follow-up appointment in: 1 year Call office in March 2024 to schedule an appointment ? ?If you have any questions or concerns before your next appointment please send [place] a message through [facility] or call our office at [phone].   ? ?TO LEAVE A MESSAGE FOR THE NURSE SELECT OPTION 2, PLEASE LEAVE A MESSAGE INCLUDING: ?YOUR NAME ?DATE OF BIRTH ?CALL BACK NUMBER ?REASON FOR CALL**this is important as we prioritize the call backs ? ?YOU WILL RECEIVE A CALL BACK THE SAME DAY AS LONG AS YOU CALL BEFORE 4:00 PM ? ?At the [facility], you and your health needs are our priority. As part of our continuing mission to provide you with exceptional heart care, we have created designated Provider Care Teams. These Care Teams include your primary Cardiologist (physician) and Advanced Practice Providers (APPs- Physician Assistants and Nurse Practitioners) who all work together to provide you with the care you need, when you need it.  ? ?You may see any of the following providers on your designated Care Team at your next follow up: ?Dr Arvilla Meres ?Dr Marca Ancona ?Tonye Becket, NP ?Robbie Lis, PA ?Jessica Milford,NP ?Anna Genre, PA ?Karle Plumber, PharmD ? ? ?Please be sure to bring in all your medications bottles to every appointment.  ? ? ? ? ?

## 2021-09-26 NOTE — Progress Notes (Signed)
? ?ADVANCED HF CLINIC NOTE ? ?Referring Physician: Dr. Berenice Bouton ?PCP: Chiquita Loth, PA ?Primary Cardiologist: None ? ?HPI: ?[name] is a 56 y/o woman with morbid obesity, gastric bypass, HTN, lupus and MCTD referred by Dr. Titus Dubin for screening for [facility] in setting of her CTD. Quit smoking in 2000 ( < 1ppd x 30 years).  ? ?Echo 2018  EF 65-70% RV normal. Normal diastolic parameters. Mild TR no significant PAH ? ?ECHO 05/07/2018  EF 65-70% RV normal. Grade IDD Mild TR. No PAH. Personally reviewed ? ?PFTs 05/07/2018  ?FEV1 2.54 ?DLCO  65% ?  ?PFTs 01/22/17 ?FEV1 2.6L (105%) ?FVC 2.94 (95%) ?DLCO 58% ? ?Had cath 2/21 for ongoing CP. Normal cors and hemodynamics  ? ?Cardiac CTA  3/21 1. Coronary artery calcium score 2.75 Agatston units. This places ?the patient in the 79th percentile for age and gender, suggesting ?intermediate to high risk for future cardiac events. 2.  No obstructive coronary disease. ? ?Refereed back today by Mr. Lindell Spar for abnormal ECG discovered during pre-op eval for knee surgery in 2/23. ECG reviewed shows anterior Q waves concerning for previous MI.  ? ?She denies CP. No significant SOB. Tolerrated knee surgery well.   ? ?Past Medical History:  ?Diagnosis Date  ? Anemia   ? Arthritis   ? Back pain   ? "OFF & ON"  ? 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 Mild  ? DDD (degenerative disc disease), lumbar 03/28/2016  ? Difficulty sleeping   ? occasional - takes med prn  ? Dizziness   ? per patient   ? Fibromyalgia   ? Heart murmur   ? Herniated lumbar intervertebral disc   ? Hypertension   ? Lupus (HCC)   ? "presents as joint pain, occ. rash  ? Lupus (HCC)   ? Lupus arthritis (HCC)   ? MCTD (mixed connective tissue disease) (HCC) 03/28/2016  ? + ANA, + Ro, + RNP  ? Osteoarthritis of both hands 03/28/2016  ? Osteoarthritis of both knees 03/28/2016  ? Moderate   ? Post-menopausal atrophic vaginitis   ? Pre-diabetes   ? ? ?Current Outpatient Medications  ?Medication Sig Dispense  Refill  ? acetaminophen (TYLENOL) 325 MG tablet Take 650 mg by mouth every 6 (six) hours as needed for mild pain or moderate pain.    ? antiseptic oral rinse (BIOTENE) LIQD 15 mLs by Mouth Rinse route as needed for dry mouth.    ? Artificial Saliva (BIOTENE ORALBALANCE DRY MOUTH) GEL Use as directed 1 application in the mouth or throat as needed (dry mouth).    ? azaTHIOprine (IMURAN) 50 MG tablet TAKE 2 TABLETS(100 MG) BY MOUTH DAILY 60 tablet 2  ? Calcium Carb-Cholecalciferol (CALCIUM 600+D3 PO) Take 1 tablet by mouth 2 (two) times daily.    ? Cholecalciferol (VITAMIN D3) 50 MCG (2000 UT) capsule Take 2,000 Units by mouth daily.    ? Dentifrices (BIOTENE DRY MOUTH GENTLE) PSTE Place 1 application onto teeth as needed (dry mouth).    ? furosemide (LASIX) 20 MG tablet Take 20 mg by mouth 2 (two) times daily as needed for fluid.  0  ? hydrochlorothiazide (HYDRODIURIL) 25 MG tablet Take 25 mg by mouth daily.    ? MAGNESIUM PO Take 1 tablet by mouth daily.    ? Multiple Vitamin (MULTIVITAMIN WITH MINERALS) TABS tablet Take 1 tablet by mouth daily.    ? naloxone (NARCAN) nasal spray 4 mg/0.1 mL Place 0.4 mg into the nose as needed.    ?  nortriptyline (PAMELOR) 50 MG capsule Take 50 mg by mouth at bedtime as needed for sleep.    ? omeprazole (PRILOSEC) 40 MG capsule Take 40 mg by mouth 2 (two) times daily as needed (heartburn).    ? oxyCODONE (ROXICODONE) 15 MG immediate release tablet Take 15 mg by mouth 4 (four) times daily as needed for pain.    ? phentermine (ADIPEX-P) 37.5 MG tablet Take 37.5 mg by mouth daily before breakfast.    ? pilocarpine (SALAGEN) 5 MG tablet TAKE 1 TABLET(5 MG) BY MOUTH THREE TIMES DAILY AS NEEDED 90 tablet 2  ? Polyethyl Glycol-Propyl Glycol (SYSTANE ULTRA OP) Place 1 drop into both eyes 2 (two) times daily as needed (dry eyes).    ? potassium chloride SA (KLOR-CON) 20 MEQ tablet Please call for office visit [phone] TAKE 2 TABLETS(40 MEQ) BY MOUTH TWICE DAILY 360 tablet 0  ? predniSONE  (DELTASONE) 10 MG tablet Take 10 mg by mouth daily as needed (arthritis pain or swelling).    ? pregabalin (LYRICA) 200 MG capsule Take 200 mg by mouth 3 (three) times daily.    ? tiZANidine (ZANAFLEX) 2 MG tablet Take 2 mg by mouth 3 (three) times daily as needed for muscle spasms.    ? nitroGLYCERIN (NITROSTAT) 0.4 MG SL tablet Place 0.4 mg under the tongue every 5 (five) minutes x 3 doses as needed for chest pain.  (Patient not taking: Reported on 09/26/2021)    ? ?No current facility-administered medications for this encounter.  ? ? ?No Known Allergies ? ?  ?Social History  ? ?Socioeconomic History  ? Marital status: Married  ?  Spouse name: Not on file  ? Number of children: Not on file  ? Years of education: Not on file  ? Highest education level: Not on file  ?Occupational History  ? Not on file  ?Tobacco Use  ? Smoking status: Former  ?  Types: Cigarettes  ?  Quit date: 09/10/1998  ?  Years since quitting: 23.0  ? Smokeless tobacco: Never  ?Vaping Use  ? Vaping Use: Never used  ?Substance and Sexual Activity  ? Alcohol use: No  ? Drug use: No  ? Sexual activity: Not on file  ?Other Topics Concern  ? Not on file  ?Social History Narrative  ? Right handed  ?   ? Highest level of edu- Assoc  ?   ? Has a 21 year old son. Lives in 2 story home  ? ?Social Determinants of Health  ? ?Financial Resource Strain: Not on file  ?Food Insecurity: Not on file  ?Transportation Needs: Not on file  ?Physical Activity: Not on file  ?Stress: Not on file  ?Social Connections: Not on file  ?Intimate Partner Violence: Not on file  ? ? ?  ?Family History  ?Problem Relation Age of Onset  ? Pulmonary embolism Mother   ? Cancer Father   ?     colon  ? COPD Other   ? Hypertension Other   ? Hyperlipidemia Other   ? Stroke Other   ? Diabetes Other   ? Cancer Other   ? Heart disease Other   ? Rheum arthritis Sister   ? Autoimmune disease Daughter   ? Graves' disease Son   ? ? ?Vitals:  ? 09/26/21 1151  ?BP: 138/78  ?Pulse: 85  ?SpO2: 99%   ?Weight: 118.8 kg (262 lb)  ? ?Wt Readings from Last 3 Encounters:  ?09/26/21 118.8 kg (262 lb)  ?07/16/21 117 kg (  257 lb 15 oz)  ?07/09/21 117 kg (257 lb 15 oz)  ? ? ? ?PHYSICAL EXAM: ?General:  Well appearing. No resp difficulty ?HEENT: normal ?Neck: supple. no JVD. Carotids 2+ bilat; no bruits. No lymphadenopathy or thryomegaly appreciated. ?Cor: PMI nondisplaced. Regular rate & rhythm. No rubs, gallops or murmurs. ?Lungs: clear ?Abdomen: soft, nontender, nondistended. No hepatosplenomegaly. No bruits or masses. Good bowel sounds. ?Extremities: no cyanosis, clubbing, rash, edema ?Neuro: alert & orientedx3, cranial nerves grossly intact. moves all 4 extremities w/o difficulty. Affect pleasant ? ? ?ECG today: NSR 81 with anteroseptal Qs. (Unchanged from 01/22/17 - prior to cardiac cath with normal coronaries) ? ? ?ASSESSMENT & PLAN: ? ?1. Abnormal ECG ?- Her ECG indeed is abnormal with chronic anteroseptal Q waves but cath and coronary CT from 2021 with normal coronaries and no wall motion abnormalities.  ?- I reassured her that despite her ECG that there has been no damage to her heart ?- I also gave her a copy of her ECG to take a picture with her phone so that she can show providers if there is any questions ? ?2. Lupus/mixed connective tissue disease ?- Dicussed incidence of PAH in patients with CTD in detail ?- Echos have not been suggestive of PAH ?- RHC in 2/21 no PAH (PA 19/6 (12) & PVR 0.9 WU) ?- Given MCTD will continue to screen with echos for development of PAH per guidelines ? ? ?Holly Meresaniel Charene Mccallister, MD  ?12:34 PM  ? ? ?

## 2021-10-03 ENCOUNTER — Ambulatory Visit: Payer: Medicare Other | Admitting: Rheumatology

## 2021-10-03 DIAGNOSIS — Z8679 Personal history of other diseases of the circulatory system: Secondary | ICD-10-CM

## 2021-10-03 DIAGNOSIS — Z87891 Personal history of nicotine dependence: Secondary | ICD-10-CM

## 2021-10-03 DIAGNOSIS — Z8659 Personal history of other mental and behavioral disorders: Secondary | ICD-10-CM

## 2021-10-03 DIAGNOSIS — M19041 Primary osteoarthritis, right hand: Secondary | ICD-10-CM

## 2021-10-03 DIAGNOSIS — Z9884 Bariatric surgery status: Secondary | ICD-10-CM

## 2021-10-03 DIAGNOSIS — M5136 Other intervertebral disc degeneration, lumbar region: Secondary | ICD-10-CM

## 2021-10-03 DIAGNOSIS — Z862 Personal history of diseases of the blood and blood-forming organs and certain disorders involving the immune mechanism: Secondary | ICD-10-CM

## 2021-10-03 DIAGNOSIS — F5101 Primary insomnia: Secondary | ICD-10-CM

## 2021-10-03 DIAGNOSIS — M170 Bilateral primary osteoarthritis of knee: Secondary | ICD-10-CM

## 2021-10-03 DIAGNOSIS — M2241 Chondromalacia patellae, right knee: Secondary | ICD-10-CM

## 2021-10-03 DIAGNOSIS — R5383 Other fatigue: Secondary | ICD-10-CM

## 2021-10-03 DIAGNOSIS — M7062 Trochanteric bursitis, left hip: Secondary | ICD-10-CM

## 2021-10-03 DIAGNOSIS — E559 Vitamin D deficiency, unspecified: Secondary | ICD-10-CM

## 2021-10-03 DIAGNOSIS — M797 Fibromyalgia: Secondary | ICD-10-CM

## 2021-10-03 DIAGNOSIS — Z9049 Acquired absence of other specified parts of digestive tract: Secondary | ICD-10-CM

## 2021-10-03 DIAGNOSIS — Z79899 Other long term (current) drug therapy: Secondary | ICD-10-CM

## 2021-10-03 DIAGNOSIS — M351 Other overlap syndromes: Secondary | ICD-10-CM

## 2021-10-04 ENCOUNTER — Ambulatory Visit (INDEPENDENT_AMBULATORY_CARE_PROVIDER_SITE_OTHER): Payer: Medicare Other

## 2021-10-04 DIAGNOSIS — I509 Heart failure, unspecified: Secondary | ICD-10-CM | POA: Diagnosis not present

## 2021-10-05 LAB — ECHOCARDIOGRAM COMPLETE
Area-P 1/2: 4.36 cm2
MV M vel: 5.1 m/s
MV Peak grad: 104 mmHg
P 1/2 time: 841 msec
Radius: 0.3 cm
S' Lateral: 3.6 cm

## 2021-11-14 NOTE — Progress Notes (Deleted)
Office Visit Note  Patient: Holly Davidson             Date of Birth: 12-Feb-1966           MRN: [id number]             PCP: Chiquita Loth, PA Referring: Chiquita Loth, PA Visit Date: 11/27/2021 Occupation: @GUAROCC @  Subjective:     History of Present Illness: Holly Davidson is a 56 y.o. female with history of mixed connective tissue disease and osteoarthritis.  She is taking imuran 50 mg 2 tablets daily.  Activities of Daily Living:  Patient reports morning stiffness for *** {minute/hour:19697}.   Patient {ACTIONS;DENIES/REPORTS:21021675::"Denies"} nocturnal pain.  Difficulty dressing/grooming: {ACTIONS;DENIES/REPORTS:21021675::"Denies"} Difficulty climbing stairs: {ACTIONS;DENIES/REPORTS:21021675::"Denies"} Difficulty getting out of chair: {ACTIONS;DENIES/REPORTS:21021675::"Denies"} Difficulty using hands for taps, buttons, cutlery, and/or writing: {ACTIONS;DENIES/REPORTS:21021675::"Denies"}  No Rheumatology ROS completed.   PMFS History:  Patient Active Problem List   Diagnosis Date Noted  . Abnormal EKG 07/28/2017  . Lupus erythematosus 07/28/2017  . Chest pain in adult 07/17/2017  . Essential hypertension 07/17/2017  . History of fibromyalgia 07/17/2017  . Other fatigue 12/18/2016  . Primary insomnia 12/18/2016  . Vitamin D deficiency 12/18/2016  . Former smoker 12/18/2016  . History of cholecystectomy 12/18/2016  . History of gastric bypass 12/18/2016  . Osteoarthritis of both hands 03/28/2016  . 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 MCTD (mixed connective tissue disease) (HCC) + ANA, + Ro, + RNP 03/28/2016  . Osteoarthritis of both knees 03/28/2016  . DDD (degenerative disc disease), lumbar 03/28/2016  . Fibromyalgia 03/28/2016  . ANA positive 10/26/2013  . Morbid obesity, Initial weight - 282, BMI - 42.7 09/09/2012    Past Medical History:  Diagnosis Date  . Anemia   . Arthritis   . Back pain    "OFF & ON"  . Chondromalacia of both  patellae 03/28/2016   Mild  . DDD (degenerative disc disease), lumbar 03/28/2016  . Difficulty sleeping    occasional - takes med prn  . Dizziness    per patient   . Fibromyalgia   . Heart murmur   . Herniated lumbar intervertebral disc   . Hypertension   . Lupus (HCC)    "presents as joint pain, occ. rash  . Lupus (HCC)   . Lupus arthritis (HCC)   . MCTD (mixed connective tissue disease) (HCC) 03/28/2016   + ANA, + Ro, + RNP  . Osteoarthritis of both hands 03/28/2016  . Osteoarthritis of both knees 03/28/2016   Moderate   . Post-menopausal atrophic vaginitis   . Pre-diabetes     Family History  Problem Relation Age of Onset  . Pulmonary embolism Mother   . Cancer Father        colon  . COPD Other   . Hypertension Other   . Hyperlipidemia Other   . Stroke Other   . Diabetes Other   . Cancer Other   . Heart disease Other   . Rheum arthritis Sister   . Autoimmune disease Daughter   . Graves' disease Son    Past Surgical History:  Procedure Laterality Date  . ABDOMINAL HYSTERECTOMY    . BREATH TEK H PYLORI N/A 09/14/2013   Procedure: BREATH TEK H PYLORI;  Surgeon: 09/16/2013, MD;  Location: [facility];  Service: General;  Laterality: N/A;  . CESAREAN SECTION    . GASTRIC ROUX-EN-Y N/A 03/14/2014   Procedure: LAPAROSCOPIC ROUX-EN-Y GASTRIC BYPASS WITH UPPER ENDOSCOPY;  Surgeon: 03/16/2014, MD;  Location: [facility];  Service: General;  Laterality: N/A;  . KNEE ARTHROSCOPY Left 07/16/2021   Procedure: LEFT KNEE ARTHROSCOPY/PARTIAL MENISECTOMY/CHONDROPLASTY;  Surgeon: Marcene Corning, MD;  Location: [facility];  Service: Orthopedics;  Laterality: Left;  . right foot surgery    . RIGHT/LEFT HEART CATH AND CORONARY ANGIOGRAPHY N/A 07/01/2019   Procedure: RIGHT/LEFT HEART CATH AND CORONARY ANGIOGRAPHY;  Surgeon: Dolores Patty, MD;  Location: [facility];  Service: Cardiovascular;  Laterality: N/A;  . TUBAL LIGATION     Social History   Social History Narrative    Right handed      Highest level of edu- Assoc      Has a 74 year old son. Lives in 2 story home   Immunization History  Administered Date(s) Administered  . PFIZER(Purple Top)SARS-COV-2 Vaccination 10/01/2019, 10/22/2019, 03/19/2020  . Pneumococcal Polysaccharide-23 04/08/2018  . Zoster Recombinat (Shingrix) 04/07/2018, 07/28/2018     Objective: Vital Signs: There were no vitals taken for this visit.   Physical Exam Vitals and nursing note reviewed.  Constitutional:      Appearance: She is well-developed.  HENT:     Head: Normocephalic and atraumatic.  Eyes:     Conjunctiva/sclera: Conjunctivae normal.  Cardiovascular:     Rate and Rhythm: Normal rate and regular rhythm.     Heart sounds: Normal heart sounds.  Pulmonary:     Effort: Pulmonary effort is normal.     Breath sounds: Normal breath sounds.  Abdominal:     General: Bowel sounds are normal.     Palpations: Abdomen is soft.  Musculoskeletal:     Cervical back: Normal range of motion.  Skin:    General: Skin is warm and dry.     Capillary Refill: Capillary refill takes less than 2 seconds.  Neurological:     Mental Status: She is alert and oriented to person, place, and time.  Psychiatric:        Behavior: Behavior normal.     Musculoskeletal Exam: ***  CDAI Exam: CDAI Score: -- Patient Global: --; Provider Global: -- Swollen: --; Tender: -- Joint Exam 11/27/2021   No joint exam has been documented for this visit   There is currently no information documented on the homunculus. [name] and complete the homunculus joint exam.  Investigation: No additional findings.  Imaging: No results found.  Recent Labs: Lab Results  Component Value Date   WBC 4.5 07/03/2021   HGB 11.9 (L) 07/03/2021   PLT 307 07/03/2021   NA 139 07/03/2021   K 3.0 (L) 07/03/2021   CL 103 07/03/2021   CO2 29 07/03/2021   GLUCOSE 104 (H) 07/03/2021   BUN 9 07/03/2021   CREATININE 0.66  07/03/2021   BILITOT 0.5 07/03/2021   ALKPHOS 90 04/21/2018   AST 16 07/03/2021   ALT 9 07/03/2021   PROT 6.7 07/03/2021   PROT 6.7 07/03/2021   ALBUMIN 4.0 04/21/2018   CALCIUM 8.8 (L) 07/03/2021   GFRAA 85 05/22/2020    Speciality Comments: No specialty comments available.  Procedures:  No procedures performed Allergies: Patient has no known allergies.   Assessment / Plan:     Visit Diagnoses: MCTD (mixed connective tissue disease) (HCC) + ANA, + Ro, + RNP  High risk medication use  Primary osteoarthritis of both hands  Primary osteoarthritis of both knees  Chondromalacia of both patellae  Fibromyalgia  Primary insomnia  Other fatigue  Trochanteric bursitis, left hip  DDD (degenerative disc disease), lumbar  History of hypertension  History of cholecystectomy  History of depression  History of anxiety  History of anemia  History of gastric bypass  Vitamin D deficiency  Former smoker  Orders: No orders of the defined types were placed in this encounter.  No orders of the defined types were placed in this encounter.   Face-to-face time spent with patient was *** minutes. Greater than 50% of time was spent in counseling and coordination of care.  Follow-Up Instructions: No follow-ups on file.   Gearldine Bienenstock, PA-C  Note - This record has been created using Dragon software.  Chart creation errors have been sought, but may not always  have been located. Such creation errors do not reflect on  the standard of medical care.

## 2021-11-27 ENCOUNTER — Ambulatory Visit: Payer: Medicare Other | Admitting: Physician Assistant

## 2021-11-27 DIAGNOSIS — Z87891 Personal history of nicotine dependence: Secondary | ICD-10-CM

## 2021-11-27 DIAGNOSIS — M19041 Primary osteoarthritis, right hand: Secondary | ICD-10-CM

## 2021-11-27 DIAGNOSIS — R5383 Other fatigue: Secondary | ICD-10-CM

## 2021-11-27 DIAGNOSIS — Z862 Personal history of diseases of the blood and blood-forming organs and certain disorders involving the immune mechanism: Secondary | ICD-10-CM

## 2021-11-27 DIAGNOSIS — M7062 Trochanteric bursitis, left hip: Secondary | ICD-10-CM

## 2021-11-27 DIAGNOSIS — M5136 Other intervertebral disc degeneration, lumbar region: Secondary | ICD-10-CM

## 2021-11-27 DIAGNOSIS — E559 Vitamin D deficiency, unspecified: Secondary | ICD-10-CM

## 2021-11-27 DIAGNOSIS — Z79899 Other long term (current) drug therapy: Secondary | ICD-10-CM

## 2021-11-27 DIAGNOSIS — Z9049 Acquired absence of other specified parts of digestive tract: Secondary | ICD-10-CM

## 2021-11-27 DIAGNOSIS — Z9884 Bariatric surgery status: Secondary | ICD-10-CM

## 2021-11-27 DIAGNOSIS — F5101 Primary insomnia: Secondary | ICD-10-CM

## 2021-11-27 DIAGNOSIS — M797 Fibromyalgia: Secondary | ICD-10-CM

## 2021-11-27 DIAGNOSIS — Z8659 Personal history of other mental and behavioral disorders: Secondary | ICD-10-CM

## 2021-11-27 DIAGNOSIS — M351 Other overlap syndromes: Secondary | ICD-10-CM

## 2021-11-27 DIAGNOSIS — Z8679 Personal history of other diseases of the circulatory system: Secondary | ICD-10-CM

## 2021-11-27 DIAGNOSIS — M170 Bilateral primary osteoarthritis of knee: Secondary | ICD-10-CM

## 2021-11-27 DIAGNOSIS — M2242 Chondromalacia patellae, left knee: Secondary | ICD-10-CM

## 2021-12-13 NOTE — Progress Notes (Deleted)
Office Visit Note  Patient: Holly Davidson             Date of Birth: [birth year]           MRN: [id number]             PCP: Alan Ripper, [place] Referring: Alan Ripper, [place] Visit Date: 12/25/2021 Occupation: @GUAROCC @  Subjective:     History of Present Illness: Holly Davidson is a 56 y.o. female with history of mixed connective tissue disease and osteoarthritis.  She is taking imuran 50 mg 2 tablets by mouth daily.   Lab work from 07/03/21 was reviewed today in the office: ANA 1:320-pattern unchanged, IFE did not reveal any monoclonal proteins, RF negative, Ro 3.9, ESR WNL, complements WNL, RNP 1.2, complements WNL, and dsDNA is negative.  The following lab work will be updated today.   Activities of Daily Living:  Patient reports morning stiffness for *** {minute/hour:19697}.   Patient {ACTIONS;DENIES/REPORTS:21021675::"Denies"} nocturnal pain.  Difficulty dressing/grooming: {ACTIONS;DENIES/REPORTS:21021675::"Denies"} Difficulty climbing stairs: {ACTIONS;DENIES/REPORTS:21021675::"Denies"} Difficulty getting out of chair: {ACTIONS;DENIES/REPORTS:21021675::"Denies"} Difficulty using hands for taps, buttons, cutlery, and/or writing: {ACTIONS;DENIES/REPORTS:21021675::"Denies"}  No Rheumatology ROS completed.   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   MCTD (mixed connective tissue disease) ([place]) + ANA, + Ro, + RNP 03/28/2016   Osteoarthritis of both knees 03/28/2016   DDD (degenerative disc  disease), lumbar 03/28/2016   Fibromyalgia 03/28/2016   ANA positive 10/26/2013   Morbid obesity, Initial weight - 282, BMI - 42.7 09/09/2012    Past Medical History:  Diagnosis Date   Anemia    Arthritis    Back pain    "OFF & ON"   Chondromalacia of both patellae 03/28/2016   Mild   DDD (degenerative disc disease), lumbar 03/28/2016   Difficulty sleeping    occasional - takes med prn   Dizziness    per patient    Fibromyalgia    Heart murmur    Herniated lumbar intervertebral disc    Hypertension    Lupus ([place])    "presents as joint pain, occ. rash   Lupus (HCC)    Lupus arthritis (HCC)    MCTD (mixed connective tissue disease) ([place]) 03/28/2016   + ANA, + Ro, + RNP   Osteoarthritis of both hands 03/28/2016   Osteoarthritis of both knees 03/28/2016   Moderate    Post-menopausal atrophic vaginitis    Pre-diabetes     Family History  Problem Relation Age of Onset   Pulmonary embolism Mother    Cancer Father        colon   COPD Other    Hypertension Other    Hyperlipidemia Other    Stroke Other    Diabetes Other    Cancer Other    Heart disease Other    Rheum arthritis Sister    Autoimmune disease Daughter    Graves' disease Son    Past Surgical History:  Procedure Laterality Date   ABDOMINAL HYSTERECTOMY     BREATH TEK H PYLORI N/A 09/14/2013  Procedure: BREATH TEK H PYLORI;  Surgeon: Shann Medal, MD;  Location: [facility];  Service: General;  Laterality: N/A;   CESAREAN SECTION     GASTRIC ROUX-EN-Y N/A 03/14/2014   Procedure: LAPAROSCOPIC ROUX-EN-Y GASTRIC BYPASS WITH UPPER ENDOSCOPY;  Surgeon: Alphonsa Overall, MD;  Location: [facility];  Service: General;  Laterality: N/A;   KNEE ARTHROSCOPY Left 07/16/2021   Procedure: LEFT KNEE ARTHROSCOPY/PARTIAL MENISECTOMY/CHONDROPLASTY;  Surgeon: Melrose Nakayama, MD;  Location: [facility];  Service: Orthopedics;  Laterality: Left;   right foot surgery     RIGHT/LEFT HEART CATH AND CORONARY ANGIOGRAPHY N/A 07/01/2019    Procedure: RIGHT/LEFT HEART CATH AND CORONARY ANGIOGRAPHY;  Surgeon: Jolaine Artist, MD;  Location: [facility];  Service: Cardiovascular;  Laterality: N/A;   TUBAL LIGATION     Social History   Social History Narrative   Right handed      Highest level of edu- Assoc      Has a 25 year old son. Lives in 2 story home   Immunization History  Administered Date(s) Administered   PFIZER(Purple Top)SARS-COV-2 Vaccination 10/01/2019, 10/22/2019, 03/19/2020   Pneumococcal Polysaccharide-23 04/08/2018   Zoster Recombinat (Shingrix) 04/07/2018, 07/28/2018     Objective: Vital Signs: There were no vitals taken for this visit.   Physical Exam Vitals and nursing note reviewed.  Constitutional:      Appearance: She is well-developed.  HENT:     Head: Normocephalic and atraumatic.  Eyes:     Conjunctiva/sclera: Conjunctivae normal.  Cardiovascular:     Rate and Rhythm: Normal rate and regular rhythm.     Heart sounds: Normal heart sounds.  Pulmonary:     Effort: Pulmonary effort is normal.     Breath sounds: Normal breath sounds.  Abdominal:     General: Bowel sounds are normal.     Palpations: Abdomen is soft.  Musculoskeletal:     Cervical back: Normal range of motion.  Skin:    General: Skin is warm and dry.     Capillary Refill: Capillary refill takes less than 2 seconds.  Neurological:     Mental Status: She is alert and oriented to person, place, and time.  Psychiatric:        Behavior: Behavior normal.      Musculoskeletal Exam: ***  CDAI Exam: CDAI Score: -- Patient Global: --; Provider Global: -- Swollen: --; Tender: -- Joint Exam 12/25/2021   No joint exam has been documented for this visit   There is currently no information documented on the homunculus. [name] and complete the homunculus joint exam.  Investigation: No additional findings.  Imaging: No results found.  Recent Labs: Lab Results  Component Value Date    WBC 4.5 07/03/2021   HGB 11.9 (L) 07/03/2021   PLT 307 07/03/2021   NA 139 07/03/2021   K 3.0 (L) 07/03/2021   CL 103 07/03/2021   CO2 29 07/03/2021   GLUCOSE 104 (H) 07/03/2021   BUN 9 07/03/2021   CREATININE 0.66 07/03/2021   BILITOT 0.5 07/03/2021   ALKPHOS 90 04/21/2018   AST 16 07/03/2021   ALT 9 07/03/2021   PROT 6.7 07/03/2021   PROT 6.7 07/03/2021   ALBUMIN 4.0 04/21/2018   CALCIUM 8.8 (L) 07/03/2021   GFRAA 85 05/22/2020    Speciality Comments: No specialty comments available.  Procedures:  No procedures performed Allergies: Patient has no known allergies.   Assessment / Plan:     Visit Diagnoses: MCTD (mixed connective  tissue disease) ([place]) + ANA, + Ro, + RNP  High risk medication use  Primary osteoarthritis of both hands  Primary osteoarthritis of both knees  Chondromalacia of both patellae  Fibromyalgia  Primary insomnia  Other fatigue  Trochanteric bursitis, left hip  DDD (degenerative disc disease), lumbar  History of hypertension  History of cholecystectomy  History of depression  History of anxiety  History of anemia  History of gastric bypass  Vitamin D deficiency  Orders: No orders of the defined types were placed in this encounter.  No orders of the defined types were placed in this encounter.   Face-to-face time spent with patient was *** minutes. Greater than 50% of time was spent in counseling and coordination of care.  Follow-Up Instructions: No follow-ups on file.   Ofilia Neas, PA-C  Note - This record has been created using Dragon software.  Chart creation errors have been sought, but may not always  have been located. Such creation errors do not reflect on  the standard of medical care.

## 2021-12-25 ENCOUNTER — Ambulatory Visit: Payer: Medicare Other | Admitting: Physician Assistant

## 2021-12-25 DIAGNOSIS — M7062 Trochanteric bursitis, left hip: Secondary | ICD-10-CM

## 2021-12-25 DIAGNOSIS — M351 Other overlap syndromes: Secondary | ICD-10-CM

## 2021-12-25 DIAGNOSIS — M797 Fibromyalgia: Secondary | ICD-10-CM

## 2021-12-25 DIAGNOSIS — E559 Vitamin D deficiency, unspecified: Secondary | ICD-10-CM

## 2021-12-25 DIAGNOSIS — R5383 Other fatigue: Secondary | ICD-10-CM

## 2021-12-25 DIAGNOSIS — Z9049 Acquired absence of other specified parts of digestive tract: Secondary | ICD-10-CM

## 2021-12-25 DIAGNOSIS — M170 Bilateral primary osteoarthritis of knee: Secondary | ICD-10-CM

## 2021-12-25 DIAGNOSIS — Z9884 Bariatric surgery status: Secondary | ICD-10-CM

## 2021-12-25 DIAGNOSIS — M5136 Other intervertebral disc degeneration, lumbar region: Secondary | ICD-10-CM

## 2021-12-25 DIAGNOSIS — F5101 Primary insomnia: Secondary | ICD-10-CM

## 2021-12-25 DIAGNOSIS — Z79899 Other long term (current) drug therapy: Secondary | ICD-10-CM

## 2021-12-25 DIAGNOSIS — M2242 Chondromalacia patellae, left knee: Secondary | ICD-10-CM

## 2021-12-25 DIAGNOSIS — Z862 Personal history of diseases of the blood and blood-forming organs and certain disorders involving the immune mechanism: Secondary | ICD-10-CM

## 2021-12-25 DIAGNOSIS — M19041 Primary osteoarthritis, right hand: Secondary | ICD-10-CM

## 2021-12-25 DIAGNOSIS — Z8659 Personal history of other mental and behavioral disorders: Secondary | ICD-10-CM

## 2021-12-25 DIAGNOSIS — Z8679 Personal history of other diseases of the circulatory system: Secondary | ICD-10-CM

## 2021-12-25 NOTE — Progress Notes (Deleted)
Office Visit Note  Patient: Holly Davidson             Date of Birth: Sep 27, 1965           MRN: [id number]             PCP: Chiquita Loth, PA Referring: Chiquita Loth, PA Visit Date: 12/26/2021 Occupation: @GUAROCC @  Subjective:    History of Present Illness: [name] is a 56 y.o. female with history of mixed connective tissue disease.    Dr. 57 09/26/21  Activities of Daily Living:  Patient reports morning stiffness for *** {minute/hour:19697}.   Patient {ACTIONS;DENIES/REPORTS:21021675::"Denies"} nocturnal pain.  Difficulty dressing/grooming: {ACTIONS;DENIES/REPORTS:21021675::"Denies"} Difficulty climbing stairs: {ACTIONS;DENIES/REPORTS:21021675::"Denies"} Difficulty getting out of chair: {ACTIONS;DENIES/REPORTS:21021675::"Denies"} Difficulty using hands for taps, buttons, cutlery, and/or writing: {ACTIONS;DENIES/REPORTS:21021675::"Denies"}  No Rheumatology ROS completed.   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   MCTD (mixed connective tissue disease) (HCC) + ANA, + Ro, + RNP 03/28/2016   Osteoarthritis of both knees 03/28/2016   DDD (degenerative disc disease), lumbar 03/28/2016   Fibromyalgia 03/28/2016   ANA positive 10/26/2013   Morbid obesity, Initial weight - 282, BMI - 42.7 09/09/2012    Past Medical History:  Diagnosis Date   Anemia    Arthritis    Back pain    "OFF & ON"   Chondromalacia of both patellae 03/28/2016   Mild   DDD (degenerative disc  disease), lumbar 03/28/2016   Difficulty sleeping    occasional - takes med prn   Dizziness    per patient    Fibromyalgia    Heart murmur    Herniated lumbar intervertebral disc    Hypertension    Lupus (HCC)    "presents as joint pain, occ. rash   Lupus (HCC)    Lupus arthritis (HCC)    MCTD (mixed connective tissue disease) (HCC) 03/28/2016   + ANA, + Ro, + RNP   Osteoarthritis of both hands 03/28/2016   Osteoarthritis of both knees 03/28/2016   Moderate    Post-menopausal atrophic vaginitis    Pre-diabetes     Family History  Problem Relation Age of Onset   Pulmonary embolism Mother    Cancer Father        colon   COPD Other    Hypertension Other    Hyperlipidemia Other    Stroke Other    Diabetes Other    Cancer Other    Heart disease Other    Rheum arthritis Sister    Autoimmune disease Daughter    13/02/2016' disease Son    Past Surgical History:  Procedure Laterality Date   ABDOMINAL HYSTERECTOMY     BREATH TEK H PYLORI N/A 09/14/2013   Procedure: BREATH TEK H PYLORI;  Surgeon: 09/16/2013, MD;  Location: [facility];  Service: General;  Laterality: N/A;   CESAREAN SECTION     GASTRIC ROUX-EN-Y N/A 03/14/2014   Procedure: LAPAROSCOPIC ROUX-EN-Y GASTRIC BYPASS WITH UPPER ENDOSCOPY;  Surgeon: 03/16/2014, MD;  Location: [facility];  Service: General;  Laterality: N/A;   KNEE ARTHROSCOPY Left 07/16/2021   Procedure: LEFT KNEE ARTHROSCOPY/PARTIAL MENISECTOMY/CHONDROPLASTY;  Surgeon: Marcene Corning, MD;  Location: [facility];  Service: Orthopedics;  Laterality: Left;   right foot surgery     RIGHT/LEFT HEART CATH AND CORONARY ANGIOGRAPHY N/A 07/01/2019   Procedure: RIGHT/LEFT HEART CATH AND CORONARY ANGIOGRAPHY;  Surgeon: Dolores Patty, MD;  Location: [facility];  Service: Cardiovascular;  Laterality: N/A;   TUBAL LIGATION     Social History   Social History Narrative   Right handed      Highest level of edu- Assoc      Has a 94 year old son. Lives  in 2 story home   Immunization History  Administered Date(s) Administered   PFIZER(Purple Top)SARS-COV-2 Vaccination 10/01/2019, 10/22/2019, 03/19/2020   Pneumococcal Polysaccharide-23 04/08/2018   Zoster Recombinat (Shingrix) 04/07/2018, 07/28/2018     Objective: Vital Signs: There were no vitals taken for this visit.   Physical Exam Vitals and nursing note reviewed.  Constitutional:      Appearance: She is well-developed.  HENT:     Head: Normocephalic and atraumatic.  Eyes:     Conjunctiva/sclera: Conjunctivae normal.  Cardiovascular:     Rate and Rhythm: Normal rate and regular rhythm.     Heart sounds: Normal heart sounds.  Pulmonary:     Effort: Pulmonary effort is normal.     Breath sounds: Normal breath sounds.  Abdominal:     General: Bowel sounds are normal.     Palpations: Abdomen is soft.  Musculoskeletal:     Cervical back: Normal range of motion.  Skin:    General: Skin is warm and dry.     Capillary Refill: Capillary refill takes less than 2 seconds.  Neurological:     Mental Status: She is alert and oriented to person, place, and time.  Psychiatric:        Behavior: Behavior normal.     Musculoskeletal Exam: ***  CDAI Exam: CDAI Score: -- Patient Global: --; Provider Global: -- Swollen: --; Tender: -- Joint Exam 12/26/2021   No joint exam has been documented for this visit   There is currently no information documented on the homunculus. [name] and complete the homunculus joint exam.  Investigation: No additional findings.  Imaging: No results found.  Recent Labs: Lab Results  Component Value Date   WBC 4.5 07/03/2021   HGB 11.9 (L) 07/03/2021   PLT 307 07/03/2021   NA 139 07/03/2021   K 3.0 (L) 07/03/2021   CL 103 07/03/2021   CO2 29 07/03/2021   GLUCOSE 104 (H) 07/03/2021   BUN 9 07/03/2021   CREATININE 0.66 07/03/2021   BILITOT 0.5 07/03/2021   ALKPHOS 90 04/21/2018   AST 16 07/03/2021   ALT 9  07/03/2021   PROT 6.7 07/03/2021   PROT 6.7 07/03/2021   ALBUMIN 4.0 04/21/2018   CALCIUM 8.8 (L) 07/03/2021   GFRAA 85 05/22/2020    Speciality Comments: No specialty comments available.  Procedures:  No procedures performed Allergies: Patient has no known allergies.   Assessment / Plan:     Visit Diagnoses: No diagnosis found.  Orders: No orders of the defined types were placed in this encounter.  No orders of the defined types were placed in this encounter.   Face-to-face time spent with patient was *** minutes. Greater than 50% of time was spent in counseling and coordination of care.  Follow-Up Instructions: No follow-ups on file.  Earnestine Mealing, CMA  Note - This record has been created using Editor, commissioning.  Chart creation errors have been sought, but may not always  have been located. Such creation errors do not reflect on  the standard of medical care.

## 2021-12-26 ENCOUNTER — Ambulatory Visit: Payer: Medicare Other | Admitting: Physician Assistant

## 2021-12-26 ENCOUNTER — Encounter: Payer: Self-pay | Admitting: *Deleted

## 2021-12-26 DIAGNOSIS — M170 Bilateral primary osteoarthritis of knee: Secondary | ICD-10-CM

## 2021-12-26 DIAGNOSIS — Z87891 Personal history of nicotine dependence: Secondary | ICD-10-CM

## 2021-12-26 DIAGNOSIS — Z79899 Other long term (current) drug therapy: Secondary | ICD-10-CM

## 2021-12-26 DIAGNOSIS — Z9884 Bariatric surgery status: Secondary | ICD-10-CM

## 2021-12-26 DIAGNOSIS — E559 Vitamin D deficiency, unspecified: Secondary | ICD-10-CM

## 2021-12-26 DIAGNOSIS — Z8659 Personal history of other mental and behavioral disorders: Secondary | ICD-10-CM

## 2021-12-26 DIAGNOSIS — F5101 Primary insomnia: Secondary | ICD-10-CM

## 2021-12-26 DIAGNOSIS — R5383 Other fatigue: Secondary | ICD-10-CM

## 2021-12-26 DIAGNOSIS — M351 Other overlap syndromes: Secondary | ICD-10-CM

## 2021-12-26 DIAGNOSIS — M797 Fibromyalgia: Secondary | ICD-10-CM

## 2021-12-26 DIAGNOSIS — Z8679 Personal history of other diseases of the circulatory system: Secondary | ICD-10-CM

## 2021-12-26 DIAGNOSIS — M2242 Chondromalacia patellae, left knee: Secondary | ICD-10-CM

## 2021-12-26 DIAGNOSIS — M19041 Primary osteoarthritis, right hand: Secondary | ICD-10-CM

## 2021-12-26 DIAGNOSIS — M5136 Other intervertebral disc degeneration, lumbar region: Secondary | ICD-10-CM

## 2021-12-26 DIAGNOSIS — M7062 Trochanteric bursitis, left hip: Secondary | ICD-10-CM

## 2021-12-26 DIAGNOSIS — Z862 Personal history of diseases of the blood and blood-forming organs and certain disorders involving the immune mechanism: Secondary | ICD-10-CM

## 2021-12-26 DIAGNOSIS — Z9049 Acquired absence of other specified parts of digestive tract: Secondary | ICD-10-CM

## 2022-07-08 ENCOUNTER — Other Ambulatory Visit: Payer: Self-pay | Admitting: Orthopaedic Surgery

## 2022-07-11 NOTE — Patient Instructions (Signed)
SURGICAL WAITING ROOM VISITATION  Patients having surgery or a procedure may have no more than 2 support people in the waiting area - these visitors may rotate.    Children under the age of 25 must have an adult with them who is not the patient.  Due to an increase in RSV and influenza rates and associated hospitalizations, children ages 73 and under may not visit patients in [place].  If the patient needs to stay at the hospital during part of their recovery, the visitor guidelines for inpatient rooms apply. Pre-op nurse will coordinate an appropriate time for 1 support person to accompany patient in pre-op.  This support person may not rotate.    Please refer to the [facility] website for the visitor guidelines for Inpatients (after your surgery is over and you are in a regular room).       Your procedure is scheduled on:  07/22/2022    Report to [facility] Main Entrance    Report to admitting at   [phone]   Call this number if you have problems the morning of surgery [phone]   Do not eat food :After Midnight.   After Midnight you may have the following liquids until ___ 0845___ AM  DAY OF SURGERY  Water Non-Citrus Juices (without pulp, NO RED-Apple, White grape, White cranberry) Black Coffee (NO MILK/CREAM OR CREAMERS, sugar ok)  Clear Tea (NO MILK/CREAM OR CREAMERS, sugar ok) regular and decaf                             Plain Jell-O (NO RED)                                           Fruit ices (not with fruit pulp, NO RED)                                     Popsicles (NO RED)                                                               Sports drinks like Gatorade (NO RED)                    The day of surgery:  Drink ONE (1) Pre-Surgery Clear Ensure or G2 at  0845 AM ( have completed by )  the morning of surgery. Drink in one sitting. Do not sip.  This drink was given to you during your hospital  pre-op appointment visit. Nothing else to  drink after completing the  Pre-Surgery Clear Ensure or G2.          If you have questions, please contact your surgeon's office.        Oral Hygiene is also important to reduce your risk of infection.                                    Remember - BRUSH YOUR TEETH THE MORNING OF  SURGERY WITH YOUR REGULAR TOOTHPASTE  DENTURES WILL BE REMOVED PRIOR TO SURGERY PLEASE DO NOT APPLY "Poly grip" OR ADHESIVES!!!   Do NOT smoke after Midnight   Take these medicines the morning of surgery with A SIP OF WATER:  omeprazole if needed, lyrica   DO NOT TAKE ANY ORAL DIABETIC MEDICATIONS DAY OF YOUR SURGERY  Bring CPAP mask and tubing day of surgery.                              You may not have any metal on your body including hair pins, jewelry, and body piercing             Do not wear make-up, lotions, powders, perfumes/cologne, or deodorant  Do not wear nail polish including gel and S&S, artificial/acrylic nails, or any other type of covering on natural nails including finger and toenails. If you have artificial nails, gel coating, etc. that needs to be removed by a nail salon please have this removed prior to surgery or surgery may need to be canceled/ delayed if the surgeon/ anesthesia feels like they are unable to be safely monitored.   Do not shave  48 hours prior to surgery.               Men may shave face and neck.   Do not bring valuables to the hospital. [place].   Contacts, glasses, dentures or bridgework may not be worn into surgery.   Bring small overnight bag day of surgery.   DO NOT [place]. PHARMACY WILL DISPENSE MEDICATIONS LISTED ON YOUR MEDICATION LIST TO YOU DURING YOUR ADMISSION [place]!    Patients discharged on the day of surgery will not be allowed to drive home.  Someone NEEDS to stay with you for the first 24 hours after anesthesia.   Special Instructions: Bring a  copy of your healthcare power of attorney and living will documents the day of surgery if you haven't scanned them before.              Please read over the following fact sheets you were given: IF [place] [phone]   If you received a COVID test during your pre-op visit  it is requested that you wear a mask when out in public, stay away from anyone that may not be feeling well and notify your surgeon if you develop symptoms. If you test positive for Covid or have been in contact with anyone that has tested positive in the last 10 days please notify you surgeon.    [place] - Preparing for Surgery Before surgery, you can play an important role.  Because skin is not sterile, your skin needs to be as free of germs as possible.  You can reduce the number of germs on your skin by washing with CHG (chlorahexidine gluconate) soap before surgery.  CHG is an antiseptic cleaner which kills germs and bonds with the skin to continue killing germs even after washing. Please DO NOT use if you have an allergy to CHG or antibacterial soaps.  If your skin becomes reddened/irritated stop using the CHG and inform your nurse when you arrive at Short Stay. Do not shave (including legs and underarms) for at least 48 hours prior  to the first CHG shower.  You may shave your face/neck. Please follow these instructions carefully:  1.  Shower with CHG Soap the night before surgery and the  morning of Surgery.  2.  If you choose to wash your hair, wash your hair first as usual with your  normal  shampoo.  3.  After you shampoo, rinse your hair and body thoroughly to remove the  shampoo.                           4.  Use CHG as you would any other liquid soap.  You can apply chg directly  to the skin and wash                       Gently with a scrungie or clean washcloth.  5.  Apply the CHG Soap to your body ONLY FROM THE NECK DOWN.   Do not use on face/ open                            Wound or open sores. Avoid contact with eyes, ears mouth and genitals (private parts).                       Wash face,  Genitals (private parts) with your normal soap.             6.  Wash thoroughly, paying special attention to the area where your surgery  will be performed.  7.  Thoroughly rinse your body with warm water from the neck down.  8.  DO NOT shower/wash with your normal soap after using and rinsing off  the CHG Soap.                9.  Pat yourself dry with a clean towel.            10.  Wear clean pajamas.            11.  Place clean sheets on your bed the night of your first shower and do not  sleep with pets. Day of Surgery : Do not apply any lotions/deodorants the morning of surgery.  Please wear clean clothes to the hospital/surgery center.  FAILURE TO FOLLOW THESE INSTRUCTIONS MAY RESULT IN THE CANCELLATION OF YOUR SURGERY PATIENT SIGNATURE_________________________________  NURSE SIGNATURE__________________________________  ________________________________________________________________________

## 2022-07-11 NOTE — Progress Notes (Signed)
Anesthesia Review:  PCP: Cardiologist : Bensimhon- LOV 09/26/21  Chest x-ray : EKG : 09/26/21  Echo : 10/05/21  CT Cors- 2021     PFT- 2019  Stress test: Cardiac Cath :  2021  Activity level:  Sleep Study/ CPAP : Fasting Blood Sugar :      / Checks Blood Sugar -- times a day:   Blood Thinner/ Instructions /Last Dose: ASA / Instructions/ Last Dose :     DM- type  Hgba1c-   Phentermine

## 2022-07-14 ENCOUNTER — Other Ambulatory Visit: Payer: Self-pay

## 2022-07-14 ENCOUNTER — Encounter (HOSPITAL_COMMUNITY)
Admission: RE | Admit: 2022-07-14 | Discharge: 2022-07-14 | Disposition: A | Discharge status: Home / Self Care | Payer: 59 | Source: Ambulatory Visit | Attending: Orthopaedic Surgery | Admitting: Orthopaedic Surgery

## 2022-07-14 ENCOUNTER — Ambulatory Visit (HOSPITAL_COMMUNITY)
Admission: RE | Admit: 2022-07-14 | Discharge: 2022-07-14 | Disposition: A | Discharge status: Home / Self Care | Payer: 59 | Source: Ambulatory Visit | Attending: Orthopaedic Surgery | Admitting: Orthopaedic Surgery

## 2022-07-14 ENCOUNTER — Encounter (HOSPITAL_COMMUNITY): Payer: Self-pay | Admitting: Orthopaedic Surgery

## 2022-07-14 VITALS — BP 128/96 | HR 59 | Temp 98.2°F | Resp 16 | Ht 65.5 in

## 2022-07-14 DIAGNOSIS — Z01818 Encounter for other preprocedural examination: Secondary | ICD-10-CM | POA: Diagnosis present

## 2022-07-14 LAB — BASIC METABOLIC PANEL
Anion gap: 8 (ref 5–15)
BUN: 11 mg/dL (ref 6–20)
CO2: 27 mmol/L (ref 22–32)
Calcium: 9.1 mg/dL (ref 8.9–10.3)
Chloride: 102 mmol/L (ref 98–111)
Creatinine, Ser: 0.72 mg/dL (ref 0.44–1.00)
GFR, Estimated: >60 mL/min (ref >60)
Glucose, Bld: 78 mg/dL (ref 70–99)
Potassium: 3.1 mmol/L — ABNORMAL LOW (ref 3.5–5.1)
Sodium: 137 mmol/L (ref 135–145)

## 2022-07-14 LAB — GLUCOSE, CAPILLARY: Glucose-Capillary: 70 mg/dL (ref 70–99)

## 2022-07-14 LAB — CBC
HCT: 38.1 % (ref 36.0–46.0)
Hemoglobin: 12.1 g/dL (ref 12.0–15.0)
MCH: 25.2 pg — ABNORMAL LOW (ref 26.0–34.0)
MCHC: 31.8 g/dL (ref 30.0–36.0)
MCV: 79.2 fL — ABNORMAL LOW (ref 80.0–100.0)
Platelets: 243 10*3/uL (ref 150–400)
RBC: 4.81 MIL/uL (ref 3.87–5.11)
RDW: 15.9 % — ABNORMAL HIGH (ref 11.5–15.5)
WBC: 4.1 10*3/uL (ref 4.0–10.5)
nRBC: 0 % (ref 0.0–0.2)

## 2022-07-14 LAB — SURGICAL PCR SCREEN
MRSA, PCR: NEGATIVE
Staphylococcus aureus: NEGATIVE

## 2022-07-15 LAB — HEMOGLOBIN A1C
Hgb A1c MFr Bld: 5.5 % (ref 4.8–5.6)
Mean Plasma Glucose: 111 mg/dL

## 2022-07-18 NOTE — H&P (Signed)
TOTAL KNEE ADMISSION H&P  Patient is being admitted for left total knee arthroplasty.  Subjective:  Chief Complaint:left knee pain.  HPI: Holly Davidson, 57 y.o. female, has a history of pain and functional disability in the left knee due to arthritis and has failed non-surgical conservative treatments for greater than 12 weeks to includeNSAID's and/or analgesics, corticosteriod injections, flexibility and strengthening excercises, supervised PT with diminished ADL's post treatment, use of assistive devices, weight reduction as appropriate, and activity modification.  Onset of symptoms was gradual, starting 5 years ago with gradually worsening course since that time. The patient noted prior procedures on the knee to include  arthroscopy on the left knee(s).  Patient currently rates pain in the left knee(s) at 10 out of 10 with activity. Patient has night pain, worsening of pain with activity and weight bearing, pain that interferes with activities of daily living, crepitus, and joint swelling.  Patient has evidence of subchondral cysts, subchondral sclerosis, periarticular osteophytes, and joint space narrowing by imaging studies. There is no active infection.  Patient Active Problem List   Diagnosis Date Noted   Abnormal EKG 07/28/2017   Lupus erythematosus 07/28/2017   Chest pain in adult 07/17/2017   Essential hypertension 07/17/2017   History of fibromyalgia 07/17/2017   Other fatigue 12/18/2016   Primary insomnia 12/18/2016   Vitamin D deficiency 12/18/2016   Former smoker 12/18/2016   History of cholecystectomy 12/18/2016   History of gastric bypass 12/18/2016   Osteoarthritis of both hands 03/28/2016   Chondromalacia of both patellae 03/28/2016   MCTD (mixed connective tissue disease) ([place]) + ANA, + Ro, + RNP 03/28/2016   Osteoarthritis of both knees 03/28/2016   DDD (degenerative disc disease), lumbar 03/28/2016   Fibromyalgia 03/28/2016   ANA positive 10/26/2013   Morbid  obesity, Initial weight - 282, BMI - 42.7 09/09/2012   Past Medical History:  Diagnosis Date   Anemia    Arthritis    Back pain    "OFF & ON"   Chondromalacia of both patellae 03/28/2016   Mild   DDD (degenerative disc disease), lumbar 03/28/2016   Difficulty sleeping    occasional - takes med prn   Dizziness    per patient    Fibromyalgia    GERD (gastroesophageal reflux disease)    Heart murmur    Herniated lumbar intervertebral disc    Hypertension    Lupus ([place])    "presents as joint pain, occ. rash   Lupus ([place])    Lupus arthritis ([place])    MCTD (mixed connective tissue disease) ([place]) 03/28/2016   + ANA, + Ro, + RNP   Osteoarthritis of both hands 03/28/2016   Osteoarthritis of both knees 03/28/2016   Moderate    Post-menopausal atrophic vaginitis    Pre-diabetes     Past Surgical History:  Procedure Laterality Date   ABDOMINAL HYSTERECTOMY     BREATH TEK H PYLORI N/A 09/14/2013   Procedure: BREATH TEK H PYLORI;  Surgeon: Shann Medal, MD;  Location: [facility];  Service: General;  Laterality: N/A;   CESAREAN SECTION     GASTRIC ROUX-EN-Y N/A 03/14/2014   Procedure: LAPAROSCOPIC ROUX-EN-Y GASTRIC BYPASS WITH UPPER ENDOSCOPY;  Surgeon: Alphonsa Overall, MD;  Location: [facility];  Service: General;  Laterality: N/A;   KNEE ARTHROSCOPY Left 07/16/2021   Procedure: LEFT KNEE ARTHROSCOPY/PARTIAL MENISECTOMY/CHONDROPLASTY;  Surgeon: Melrose Nakayama, MD;  Location: [facility];  Service: Orthopedics;  Laterality: Left;   right foot surgery     RIGHT/LEFT  HEART CATH AND CORONARY ANGIOGRAPHY N/A 07/01/2019   Procedure: RIGHT/LEFT HEART CATH AND CORONARY ANGIOGRAPHY;  Surgeon: Jolaine Artist, MD;  Location: [facility];  Service: Cardiovascular;  Laterality: N/A;   TUBAL LIGATION      No current facility-administered medications for this encounter.   Current Outpatient Medications  Medication Sig Dispense Refill Last Dose   antiseptic oral rinse (BIOTENE) LIQD 15 mLs by  Mouth Rinse route as needed for dry mouth.      Calcium Carb-Cholecalciferol (CALCIUM 600+D3 PO) Take 1 tablet by mouth 2 (two) times daily.      carboxymethylcellulose (REFRESH PLUS) 0.5 % SOLN Place 1 drop into both eyes 3 (three) times daily as needed (dry eyes).      Cholecalciferol (VITAMIN D3) 50 MCG (2000 UT) capsule Take 2,000 Units by mouth daily.      ferrous sulfate 325 (65 FE) MG tablet Take 325 mg by mouth daily.      furosemide (LASIX) 20 MG tablet Take 20 mg by mouth 2 (two) times daily.  0    hydrochlorothiazide (HYDRODIURIL) 25 MG tablet Take 25 mg by mouth daily.      LINZESS 72 MCG capsule Take 72 mcg by mouth every morning.      MOUNJARO 7.5 MG/0.5ML Pen Inject 7.5 mg into the skin every Friday.      Multiple Vitamin (MULTIVITAMIN WITH MINERALS) TABS tablet Take 1 tablet by mouth daily.      naloxone (NARCAN) nasal spray 4 mg/0.1 mL Place 0.4 mg into the nose once.      nortriptyline (PAMELOR) 50 MG capsule Take 50 mg by mouth at bedtime as needed for sleep.      omeprazole (PRILOSEC) 40 MG capsule Take 40 mg by mouth 2 (two) times daily as needed (heartburn).      oxyCODONE (ROXICODONE) 15 MG immediate release tablet Take 15 mg by mouth in the morning, at noon, in the evening, and at bedtime.      pilocarpine (SALAGEN) 5 MG tablet TAKE 1 TABLET(5 MG) BY MOUTH THREE TIMES DAILY AS NEEDED 90 tablet 2    potassium chloride SA (KLOR-CON) 20 MEQ tablet Please call for office visit [phone] TAKE 2 TABLETS(40 MEQ) BY MOUTH TWICE DAILY (Patient taking differently: Take 40 mEq by mouth 2 (two) times daily.) 360 tablet 0    pregabalin (LYRICA) 200 MG capsule Take 200 mg by mouth 3 (three) times daily.      rosuvastatin (CRESTOR) 10 MG tablet Take 1 tablet (10 mg total) by mouth daily. 30 tablet 11    tiZANidine (ZANAFLEX) 2 MG tablet Take 2 mg by mouth 3 (three) times daily.      azaTHIOprine (IMURAN) 50 MG tablet TAKE 2 TABLETS(100 MG) BY MOUTH DAILY (Patient not taking: Reported  on 07/11/2022) 60 tablet 2 Not Taking   No Known Allergies  Social History   Tobacco Use   Smoking status: Former    Types: Cigarettes    Quit date: 09/10/1998    Years since quitting: 23.8   Smokeless tobacco: Never  Substance Use Topics   Alcohol use: No    Family History  Problem Relation Age of Onset   Pulmonary embolism Mother    Cancer Father        colon   COPD Other    Hypertension Other    Hyperlipidemia Other    Stroke Other    Diabetes Other    Cancer Other    Heart disease Other  Rheum arthritis Sister    Autoimmune disease Daughter    Berenice Primas' disease Son      Review of Systems  Musculoskeletal:  Positive for arthralgias.       Left knee  All other systems reviewed and are negative.   Objective:  Physical Exam Constitutional:      Appearance: Normal appearance.  HENT:     Head: Normocephalic and atraumatic.     Nose: Nose normal.     Mouth/Throat:     Pharynx: Oropharynx is clear.  Eyes:     Extraocular Movements: Extraocular movements intact.  Pulmonary:     Effort: Pulmonary effort is normal.  Abdominal:     Palpations: Abdomen is soft.  Musculoskeletal:     Cervical back: Normal range of motion.     Comments: Left knee motion is about 0-110.  She has crepitation and medial joint line pain.  She does have trace effusion.  Hip motion is good and straight leg raise is negative.    Skin:    General: Skin is warm and dry.  Neurological:     General: No focal deficit present.     Mental Status: She is alert and oriented to person, place, and time.  Psychiatric:        Mood and Affect: Mood normal.        Behavior: Behavior normal.        Thought Content: Thought content normal.        Judgment: Judgment normal.     Vital signs in last 24 hours:    Labs:   Estimated body mass index is 42.94 kg/m as calculated from the following:   Height as of 07/14/22: 5' 5.5" (1.664 m).   Weight as of 09/26/21: 118.8 kg.   Imaging Review Plain  radiographs demonstrate severe degenerative joint disease of the left knee(s). The overall alignment isneutral. The bone quality appears to be good for age and reported activity level.      Assessment/Plan:  End stage primary arthritis, left knee   The patient history, physical examination, clinical judgment of the provider and imaging studies are consistent with end stage degenerative joint disease of the left knee(s) and total knee arthroplasty is deemed medically necessary. The treatment options including medical management, injection therapy arthroscopy and arthroplasty were discussed at length. The risks and benefits of total knee arthroplasty were presented and reviewed. The risks due to aseptic loosening, infection, stiffness, patella tracking problems, thromboembolic complications and other imponderables were discussed. The patient acknowledged the explanation, agreed to proceed with the plan and consent was signed. Patient is being admitted for inpatient treatment for surgery, pain control, PT, OT, prophylactic antibiotics, VTE prophylaxis, progressive ambulation and ADL's and discharge planning. The patient is planning to be discharged home with home health services   Patient's anticipated LOS is less than 2 midnights, meeting these requirements: - Younger than 60 - Lives within 1 hour of care - Has a competent adult at home to recover with post-op recover - NO history of  - Chronic pain requiring opiods  - Diabetes  - Coronary Artery Disease  - Heart failure  - Heart attack  - Stroke  - DVT/VTE  - Cardiac arrhythmia  - Respiratory Failure/COPD  - Renal failure  - Anemia  - Advanced Liver disease

## 2022-07-22 ENCOUNTER — Other Ambulatory Visit: Payer: Self-pay

## 2022-07-22 ENCOUNTER — Encounter (HOSPITAL_COMMUNITY)
Admission: RE | Disposition: A | Discharge status: Home Health | Payer: Self-pay | Source: Home / Self Care | Attending: Orthopaedic Surgery

## 2022-07-22 ENCOUNTER — Ambulatory Visit (HOSPITAL_COMMUNITY): Payer: 59 | Admitting: Anesthesiology

## 2022-07-22 ENCOUNTER — Ambulatory Visit (HOSPITAL_COMMUNITY): Payer: 59 | Admitting: Physician Assistant

## 2022-07-22 ENCOUNTER — Inpatient Hospital Stay (HOSPITAL_COMMUNITY)
Admission: RE | Admit: 2022-07-22 | Discharge: 2022-07-25 | DRG: 470 | Disposition: A | Discharge status: Home Health | Payer: 59 | Attending: Orthopaedic Surgery | Admitting: Orthopaedic Surgery

## 2022-07-22 ENCOUNTER — Encounter (HOSPITAL_COMMUNITY): Payer: Self-pay | Admitting: Orthopaedic Surgery

## 2022-07-22 DIAGNOSIS — K219 Gastro-esophageal reflux disease without esophagitis: Secondary | ICD-10-CM | POA: Diagnosis present

## 2022-07-22 DIAGNOSIS — Z8249 Family history of ischemic heart disease and other diseases of the circulatory system: Secondary | ICD-10-CM

## 2022-07-22 DIAGNOSIS — M797 Fibromyalgia: Secondary | ICD-10-CM | POA: Diagnosis present

## 2022-07-22 DIAGNOSIS — R7303 Prediabetes: Secondary | ICD-10-CM | POA: Diagnosis present

## 2022-07-22 DIAGNOSIS — Z90710 Acquired absence of both cervix and uterus: Secondary | ICD-10-CM

## 2022-07-22 DIAGNOSIS — E559 Vitamin D deficiency, unspecified: Secondary | ICD-10-CM | POA: Diagnosis present

## 2022-07-22 DIAGNOSIS — M1712 Unilateral primary osteoarthritis, left knee: Secondary | ICD-10-CM | POA: Diagnosis not present

## 2022-07-22 DIAGNOSIS — Z79899 Other long term (current) drug therapy: Secondary | ICD-10-CM

## 2022-07-22 DIAGNOSIS — Z9049 Acquired absence of other specified parts of digestive tract: Secondary | ICD-10-CM

## 2022-07-22 DIAGNOSIS — Z87891 Personal history of nicotine dependence: Secondary | ICD-10-CM | POA: Diagnosis not present

## 2022-07-22 DIAGNOSIS — I10 Essential (primary) hypertension: Secondary | ICD-10-CM | POA: Diagnosis not present

## 2022-07-22 DIAGNOSIS — Z825 Family history of asthma and other chronic lower respiratory diseases: Secondary | ICD-10-CM

## 2022-07-22 DIAGNOSIS — Z833 Family history of diabetes mellitus: Secondary | ICD-10-CM

## 2022-07-22 DIAGNOSIS — L930 Discoid lupus erythematosus: Secondary | ICD-10-CM | POA: Diagnosis present

## 2022-07-22 DIAGNOSIS — Z01818 Encounter for other preprocedural examination: Secondary | ICD-10-CM

## 2022-07-22 DIAGNOSIS — Z823 Family history of stroke: Secondary | ICD-10-CM

## 2022-07-22 DIAGNOSIS — Z9884 Bariatric surgery status: Secondary | ICD-10-CM

## 2022-07-22 HISTORY — DX: Gastro-esophageal reflux disease without esophagitis: K21.9

## 2022-07-22 HISTORY — PX: TOTAL KNEE ARTHROPLASTY: SHX125

## 2022-07-22 SURGERY — ARTHROPLASTY, KNEE, TOTAL
Anesthesia: Spinal | Site: Knee | Laterality: Left

## 2022-07-22 MED ORDER — ONDANSETRON HCL 4 MG/2ML IJ SOLN: 4.0000 mg | Freq: Four times a day (QID) | INTRAMUSCULAR | Status: DC | PRN

## 2022-07-22 MED ORDER — ACETAMINOPHEN 160 MG/5ML PO SOLN: 325.0000 mg | Freq: Once | ORAL | Status: DC | PRN

## 2022-07-22 MED ORDER — LACTATED RINGERS IV SOLN: INTRAVENOUS | Status: DC

## 2022-07-22 MED ORDER — BUPIVACAINE LIPOSOME 1.3 % IJ SUSP
INTRAMUSCULAR | Status: DC | PRN
  Administered 2022-07-22: 20 mL

## 2022-07-22 MED ORDER — OXYCODONE HCL 5 MG PO TABS
5.0000 mg | ORAL_TABLET | ORAL | Status: DC | PRN
  Administered 2022-07-22 (×2): 5 mg via ORAL
  Administered 2022-07-23 – 2022-07-24 (×5): 10 mg via ORAL
  Filled 2022-07-22 (×4): qty 2
  Filled 2022-07-22: qty 1
  Filled 2022-07-22: qty 2
  Filled 2022-07-22: qty 1
  Filled 2022-07-22 (×4): qty 2

## 2022-07-22 MED ORDER — EPHEDRINE 5 MG/ML INJ
INTRAVENOUS | Status: AC
  Filled 2022-07-22: qty 5

## 2022-07-22 MED ORDER — MEPERIDINE HCL 50 MG/ML IJ SOLN: 6.2500 mg | INTRAMUSCULAR | Status: DC | PRN

## 2022-07-22 MED ORDER — FENTANYL CITRATE (PF) 100 MCG/2ML IJ SOLN
INTRAMUSCULAR | Status: AC
  Filled 2022-07-22: qty 2

## 2022-07-22 MED ORDER — FENTANYL CITRATE (PF) 100 MCG/2ML IJ SOLN
INTRAMUSCULAR | Status: DC | PRN
  Administered 2022-07-22: 25 ug via INTRAVENOUS
  Administered 2022-07-22: 50 ug via INTRAVENOUS

## 2022-07-22 MED ORDER — BUPIVACAINE IN DEXTROSE 0.75-8.25 % IT SOLN
INTRATHECAL | Status: DC | PRN
  Administered 2022-07-22: 1.8 mL via INTRATHECAL

## 2022-07-22 MED ORDER — MIDAZOLAM HCL 5 MG/5ML IJ SOLN
INTRAMUSCULAR | Status: DC | PRN
  Administered 2022-07-22: 1 mg via INTRAVENOUS

## 2022-07-22 MED ORDER — SODIUM CHLORIDE 0.9 % IR SOLN
Status: DC | PRN
  Administered 2022-07-22: 3000 mL

## 2022-07-22 MED ORDER — OXYCODONE HCL 5 MG PO TABS
10.0000 mg | ORAL_TABLET | ORAL | Status: DC | PRN
  Administered 2022-07-22: 15 mg via ORAL
  Administered 2022-07-23 – 2022-07-25 (×6): 10 mg via ORAL
  Filled 2022-07-22: qty 3
  Filled 2022-07-22 (×2): qty 2

## 2022-07-22 MED ORDER — MIDAZOLAM HCL 2 MG/2ML IJ SOLN
1.0000 mg | Freq: Once | INTRAMUSCULAR | Status: AC
  Administered 2022-07-22: 2 mg via INTRAVENOUS
  Filled 2022-07-22: qty 2

## 2022-07-22 MED ORDER — KETOROLAC TROMETHAMINE 15 MG/ML IJ SOLN
15.0000 mg | Freq: Four times a day (QID) | INTRAMUSCULAR | Status: AC
  Administered 2022-07-22 – 2022-07-23 (×4): 15 mg via INTRAVENOUS
  Filled 2022-07-22 (×4): qty 1

## 2022-07-22 MED ORDER — DEXAMETHASONE SODIUM PHOSPHATE 10 MG/ML IJ SOLN
INTRAMUSCULAR | Status: AC
  Filled 2022-07-22: qty 1

## 2022-07-22 MED ORDER — AMISULPRIDE (ANTIEMETIC) 5 MG/2ML IV SOLN: 10.0000 mg | Freq: Once | INTRAVENOUS | Status: DC | PRN

## 2022-07-22 MED ORDER — PREGABALIN 100 MG PO CAPS
200.0000 mg | ORAL_CAPSULE | Freq: Three times a day (TID) | ORAL | Status: DC
  Administered 2022-07-22 – 2022-07-25 (×10): 200 mg via ORAL
  Filled 2022-07-22 (×10): qty 2

## 2022-07-22 MED ORDER — POLYVINYL ALCOHOL 1.4 % OP SOLN: 1.0000 [drp] | Freq: Three times a day (TID) | OPHTHALMIC | Status: DC | PRN

## 2022-07-22 MED ORDER — PROMETHAZINE HCL 25 MG/ML IJ SOLN: 6.2500 mg | INTRAMUSCULAR | Status: DC | PRN

## 2022-07-22 MED ORDER — MENTHOL 3 MG MT LOZG: 1.0000 | LOZENGE | OROMUCOSAL | Status: DC | PRN

## 2022-07-22 MED ORDER — BUPIVACAINE LIPOSOME 1.3 % IJ SUSP
INTRAMUSCULAR | Status: AC
  Filled 2022-07-22: qty 20

## 2022-07-22 MED ORDER — HYDROMORPHONE HCL 1 MG/ML IJ SOLN
0.5000 mg | INTRAMUSCULAR | Status: DC | PRN
  Administered 2022-07-22: 0.5 mg via INTRAVENOUS
  Filled 2022-07-22: qty 1

## 2022-07-22 MED ORDER — ALUM & MAG HYDROXIDE-SIMETH 200-200-20 MG/5ML PO SUSP: 30.0000 mL | ORAL | Status: DC | PRN

## 2022-07-22 MED ORDER — ACETAMINOPHEN 500 MG PO TABS
1000.0000 mg | ORAL_TABLET | Freq: Four times a day (QID) | ORAL | Status: AC
  Administered 2022-07-22 – 2022-07-23 (×4): 1000 mg via ORAL
  Filled 2022-07-22 (×4): qty 2

## 2022-07-22 MED ORDER — ACETAMINOPHEN 325 MG PO TABS
325.0000 mg | ORAL_TABLET | Freq: Four times a day (QID) | ORAL | Status: DC | PRN
  Administered 2022-07-24 – 2022-07-25 (×3): 650 mg via ORAL
  Filled 2022-07-22 (×3): qty 2

## 2022-07-22 MED ORDER — TRANEXAMIC ACID-NACL 1000-0.7 MG/100ML-% IV SOLN
1000.0000 mg | Freq: Once | INTRAVENOUS | Status: AC
  Administered 2022-07-22: 1000 mg via INTRAVENOUS
  Filled 2022-07-22: qty 100

## 2022-07-22 MED ORDER — FUROSEMIDE 20 MG PO TABS
20.0000 mg | ORAL_TABLET | Freq: Two times a day (BID) | ORAL | Status: DC
  Administered 2022-07-22 – 2022-07-24 (×4): 20 mg via ORAL
  Filled 2022-07-22 (×4): qty 1

## 2022-07-22 MED ORDER — SODIUM CHLORIDE (PF) 0.9 % IJ SOLN
INTRAMUSCULAR | Status: AC
  Filled 2022-07-22: qty 30

## 2022-07-22 MED ORDER — METOCLOPRAMIDE HCL 5 MG PO TABS: 5.0000 mg | ORAL_TABLET | Freq: Three times a day (TID) | ORAL | Status: DC | PRN

## 2022-07-22 MED ORDER — POTASSIUM CHLORIDE CRYS ER 20 MEQ PO TBCR
40.0000 meq | EXTENDED_RELEASE_TABLET | Freq: Two times a day (BID) | ORAL | Status: DC
  Administered 2022-07-22 – 2022-07-25 (×6): 40 meq via ORAL
  Filled 2022-07-22 (×6): qty 2

## 2022-07-22 MED ORDER — CEFAZOLIN SODIUM-DEXTROSE 2-4 GM/100ML-% IV SOLN
2.0000 g | Freq: Four times a day (QID) | INTRAVENOUS | Status: AC
  Administered 2022-07-22 (×2): 2 g via INTRAVENOUS
  Filled 2022-07-22 (×2): qty 100

## 2022-07-22 MED ORDER — ONDANSETRON HCL 4 MG PO TABS: 4.0000 mg | ORAL_TABLET | Freq: Four times a day (QID) | ORAL | Status: DC | PRN

## 2022-07-22 MED ORDER — ACETAMINOPHEN 325 MG PO TABS: 325.0000 mg | ORAL_TABLET | Freq: Once | ORAL | Status: DC | PRN

## 2022-07-22 MED ORDER — ORAL CARE MOUTH RINSE: 15.0000 mL | Freq: Once | OROMUCOSAL | Status: AC

## 2022-07-22 MED ORDER — TRANEXAMIC ACID-NACL 1000-0.7 MG/100ML-% IV SOLN
1000.0000 mg | INTRAVENOUS | Status: AC
  Administered 2022-07-22: 1000 mg via INTRAVENOUS
  Filled 2022-07-22: qty 100

## 2022-07-22 MED ORDER — LIDOCAINE HCL (PF) 2 % IJ SOLN
INTRAMUSCULAR | Status: AC
  Filled 2022-07-22: qty 5

## 2022-07-22 MED ORDER — ONDANSETRON HCL 4 MG/2ML IJ SOLN
INTRAMUSCULAR | Status: DC | PRN
  Administered 2022-07-22: 4 mg via INTRAVENOUS

## 2022-07-22 MED ORDER — TRANEXAMIC ACID 1000 MG/10ML IV SOLN
2000.0000 mg | INTRAVENOUS | Status: DC
  Filled 2022-07-22: qty 20

## 2022-07-22 MED ORDER — ACETAMINOPHEN 10 MG/ML IV SOLN: 1000.0000 mg | Freq: Once | INTRAVENOUS | Status: DC | PRN

## 2022-07-22 MED ORDER — CEFAZOLIN SODIUM-DEXTROSE 2-4 GM/100ML-% IV SOLN
2.0000 g | INTRAVENOUS | Status: AC
  Administered 2022-07-22: 2 g via INTRAVENOUS
  Filled 2022-07-22: qty 100

## 2022-07-22 MED ORDER — BUPIVACAINE-EPINEPHRINE (PF) 0.25% -1:200000 IJ SOLN
INTRAMUSCULAR | Status: AC
  Filled 2022-07-22: qty 30

## 2022-07-22 MED ORDER — CHLORHEXIDINE GLUCONATE 0.12 % MT SOLN
15.0000 mL | Freq: Once | OROMUCOSAL | Status: AC
  Administered 2022-07-22: 15 mL via OROMUCOSAL

## 2022-07-22 MED ORDER — PHENOL 1.4 % MT LIQD: 1.0000 | OROMUCOSAL | Status: DC | PRN

## 2022-07-22 MED ORDER — LIDOCAINE HCL (CARDIAC) PF 100 MG/5ML IV SOSY
PREFILLED_SYRINGE | INTRAVENOUS | Status: DC | PRN
  Administered 2022-07-22: 40 mg via INTRAVENOUS

## 2022-07-22 MED ORDER — BUPIVACAINE-EPINEPHRINE 0.25% -1:200000 IJ SOLN
INTRAMUSCULAR | Status: DC | PRN
  Administered 2022-07-22: 30 mL

## 2022-07-22 MED ORDER — METOCLOPRAMIDE HCL 5 MG/ML IJ SOLN: 5.0000 mg | Freq: Three times a day (TID) | INTRAMUSCULAR | Status: DC | PRN

## 2022-07-22 MED ORDER — ONDANSETRON HCL 4 MG/2ML IJ SOLN
INTRAMUSCULAR | Status: AC
  Filled 2022-07-22: qty 2

## 2022-07-22 MED ORDER — ASPIRIN 81 MG PO CHEW
81.0000 mg | CHEWABLE_TABLET | Freq: Two times a day (BID) | ORAL | Status: DC
  Administered 2022-07-23 – 2022-07-25 (×5): 81 mg via ORAL
  Filled 2022-07-22 (×5): qty 1

## 2022-07-22 MED ORDER — HYDROCHLOROTHIAZIDE 25 MG PO TABS
25.0000 mg | ORAL_TABLET | Freq: Every day | ORAL | Status: DC
  Administered 2022-07-23: 25 mg via ORAL
  Filled 2022-07-22 (×3): qty 1

## 2022-07-22 MED ORDER — METHOCARBAMOL 500 MG IVPB - SIMPLE MED: 500.0000 mg | Freq: Four times a day (QID) | INTRAVENOUS | Status: DC | PRN

## 2022-07-22 MED ORDER — 0.9 % SODIUM CHLORIDE (POUR BTL) OPTIME
TOPICAL | Status: DC | PRN
  Administered 2022-07-22: 1000 mL

## 2022-07-22 MED ORDER — FENTANYL CITRATE PF 50 MCG/ML IJ SOSY
50.0000 ug | PREFILLED_SYRINGE | Freq: Once | INTRAMUSCULAR | Status: AC
  Administered 2022-07-22: 50 ug via INTRAVENOUS
  Filled 2022-07-22: qty 2

## 2022-07-22 MED ORDER — BISACODYL 5 MG PO TBEC: 5.0000 mg | DELAYED_RELEASE_TABLET | Freq: Every day | ORAL | Status: DC | PRN

## 2022-07-22 MED ORDER — METHOCARBAMOL 500 MG PO TABS
500.0000 mg | ORAL_TABLET | Freq: Four times a day (QID) | ORAL | Status: DC | PRN
  Administered 2022-07-22 – 2022-07-24 (×4): 500 mg via ORAL
  Filled 2022-07-22 (×4): qty 1

## 2022-07-22 MED ORDER — TRANEXAMIC ACID 1000 MG/10ML IV SOLN
INTRAVENOUS | Status: DC | PRN
  Administered 2022-07-22: 2000 mg via TOPICAL

## 2022-07-22 MED ORDER — LINACLOTIDE 72 MCG PO CAPS
72.0000 ug | ORAL_CAPSULE | Freq: Every morning | ORAL | Status: DC
  Administered 2022-07-23 – 2022-07-25 (×3): 72 ug via ORAL
  Filled 2022-07-22 (×3): qty 1

## 2022-07-22 MED ORDER — ROSUVASTATIN CALCIUM 10 MG PO TABS
10.0000 mg | ORAL_TABLET | Freq: Every day | ORAL | Status: DC
  Administered 2022-07-23 – 2022-07-25 (×3): 10 mg via ORAL
  Filled 2022-07-22 (×3): qty 1

## 2022-07-22 MED ORDER — NORTRIPTYLINE HCL 25 MG PO CAPS
50.0000 mg | ORAL_CAPSULE | Freq: Every evening | ORAL | Status: DC | PRN
  Administered 2022-07-23: 50 mg via ORAL
  Filled 2022-07-22 (×2): qty 2

## 2022-07-22 MED ORDER — DOCUSATE SODIUM 100 MG PO CAPS
100.0000 mg | ORAL_CAPSULE | Freq: Two times a day (BID) | ORAL | Status: DC
  Administered 2022-07-22 – 2022-07-25 (×6): 100 mg via ORAL
  Filled 2022-07-22 (×6): qty 1

## 2022-07-22 MED ORDER — DEXMEDETOMIDINE HCL IN NACL 80 MCG/20ML IV SOLN
INTRAVENOUS | Status: DC | PRN
  Administered 2022-07-22: 8 ug via BUCCAL

## 2022-07-22 MED ORDER — BUPIVACAINE LIPOSOME 1.3 % IJ SUSP: 20.0000 mL | Freq: Once | INTRAMUSCULAR | Status: DC

## 2022-07-22 MED ORDER — ROPIVACAINE HCL 5 MG/ML IJ SOLN
INTRAMUSCULAR | Status: DC | PRN
  Administered 2022-07-22: 30 mL via PERINEURAL

## 2022-07-22 MED ORDER — PHENYLEPHRINE HCL (PRESSORS) 10 MG/ML IV SOLN
INTRAVENOUS | Status: DC | PRN
  Administered 2022-07-22: 40 ug via INTRAVENOUS
  Administered 2022-07-22: 100 ug via INTRAVENOUS

## 2022-07-22 MED ORDER — PHENYLEPHRINE 80 MCG/ML (10ML) SYRINGE FOR IV PUSH (FOR BLOOD PRESSURE SUPPORT)
PREFILLED_SYRINGE | INTRAVENOUS | Status: AC
  Filled 2022-07-22: qty 10

## 2022-07-22 MED ORDER — SODIUM CHLORIDE (PF) 0.9 % IJ SOLN
INTRAMUSCULAR | Status: DC | PRN
  Administered 2022-07-22: 30 mL

## 2022-07-22 MED ORDER — CARBOXYMETHYLCELLULOSE SODIUM 0.5 % OP SOLN: 1.0000 [drp] | Freq: Three times a day (TID) | OPHTHALMIC | Status: DC | PRN

## 2022-07-22 MED ORDER — HYDROMORPHONE HCL 1 MG/ML IJ SOLN: 0.2500 mg | INTRAMUSCULAR | Status: DC | PRN

## 2022-07-22 MED ORDER — MIDAZOLAM HCL 2 MG/2ML IJ SOLN
INTRAMUSCULAR | Status: AC
  Filled 2022-07-22: qty 2

## 2022-07-22 MED ORDER — POVIDONE-IODINE 10 % EX SWAB
2.0000 | Freq: Once | CUTANEOUS | Status: AC
  Administered 2022-07-22: 2 via TOPICAL

## 2022-07-22 MED ORDER — DIPHENHYDRAMINE HCL 12.5 MG/5ML PO ELIX: 12.5000 mg | ORAL_SOLUTION | ORAL | Status: DC | PRN

## 2022-07-22 MED ORDER — PROPOFOL 500 MG/50ML IV EMUL
INTRAVENOUS | Status: DC | PRN
  Administered 2022-07-22: 50 ug/kg/min via INTRAVENOUS

## 2022-07-22 MED ORDER — EPHEDRINE SULFATE (PRESSORS) 50 MG/ML IJ SOLN
INTRAMUSCULAR | Status: DC | PRN
  Administered 2022-07-22 (×3): 5 mg via INTRAVENOUS

## 2022-07-22 MED ORDER — PANTOPRAZOLE SODIUM 40 MG PO TBEC
80.0000 mg | DELAYED_RELEASE_TABLET | Freq: Every day | ORAL | Status: DC
  Administered 2022-07-23 – 2022-07-25 (×3): 80 mg via ORAL
  Filled 2022-07-22 (×3): qty 2

## 2022-07-22 SURGICAL SUPPLY — 64 items
ATTUNE PS FEM LT SZ 4 CEM KNEE (Femur) IMPLANT
ATTUNE PSRP INSR SZ4 8 KNEE (Insert) IMPLANT
BAG COUNTER SPONGE SURGICOUNT (BAG) ×1 IMPLANT
BAG DECANTER FOR FLEXI CONT (MISCELLANEOUS) ×1 IMPLANT
BAG SPEC THK2 15X12 ZIP CLS (MISCELLANEOUS) ×1
BAG SPNG CNTER NS LX DISP (BAG) ×1
BAG ZIPLOCK 12X15 (MISCELLANEOUS) ×1 IMPLANT
BASE TIBIA ATTUNE KNEE SYS SZ6 (Knees) IMPLANT
BLADE SAGITTAL 25.0X1.19X90 (BLADE) ×1 IMPLANT
BLADE SAW SGTL 11.0X1.19X90.0M (BLADE) ×1 IMPLANT
BLADE SURG SZ10 CARB STEEL (BLADE) ×1 IMPLANT
BNDG CMPR 5X62 HK CLSR LF (GAUZE/BANDAGES/DRESSINGS) ×1
BNDG ELASTIC 6INX 5YD STR LF (GAUZE/BANDAGES/DRESSINGS) ×1 IMPLANT
BNDG ELASTIC 6X5.8 VLCR STR LF (GAUZE/BANDAGES/DRESSINGS) IMPLANT
BOOTIES KNEE HIGH SLOAN (MISCELLANEOUS) ×1 IMPLANT
BOWL SMART MIX CTS (DISPOSABLE) ×1 IMPLANT
BSPLAT TIB 6 CMNT ROT PLAT STR (Knees) ×1 IMPLANT
CEMENT HV SMART SET (Cement) ×2 IMPLANT
COVER SURGICAL LIGHT HANDLE (MISCELLANEOUS) ×1 IMPLANT
CUFF TOURN SGL QUICK 34 (TOURNIQUET CUFF) ×1
CUFF TRNQT CYL 34X4.125X (TOURNIQUET CUFF) ×1 IMPLANT
DRAPE TOP 10253 STERILE (DRAPES) ×1 IMPLANT
DRAPE U-SHAPE 47X51 STRL (DRAPES) ×1 IMPLANT
DRESSING AQUACEL AG SP 3.5X10 (GAUZE/BANDAGES/DRESSINGS) IMPLANT
DRSG AQUACEL AG ADV 3.5X10 (GAUZE/BANDAGES/DRESSINGS) ×1 IMPLANT
DRSG AQUACEL AG SP 3.5X10 (GAUZE/BANDAGES/DRESSINGS) ×1
DURAPREP 26ML APPLICATOR (WOUND CARE) ×2 IMPLANT
ELECT REM PT RETURN 15FT ADLT (MISCELLANEOUS) ×1 IMPLANT
GLOVE BIO SURGEON STRL SZ8 (GLOVE) ×2 IMPLANT
GLOVE BIOGEL PI IND STRL 7.0 (GLOVE) ×1 IMPLANT
GLOVE BIOGEL PI IND STRL 8 (GLOVE) ×2 IMPLANT
GLOVE SURG SYN 7.0 (GLOVE) ×1 IMPLANT
GLOVE SURG SYN 7.0 PF PI (GLOVE) ×1 IMPLANT
GOWN SRG XL LVL 4 BRTHBL STRL (GOWNS) ×1 IMPLANT
GOWN STRL NON-REIN XL LVL4 (GOWNS) ×1
GOWN STRL REUS W/ TWL XL LVL3 (GOWN DISPOSABLE) ×2 IMPLANT
GOWN STRL REUS W/TWL XL LVL3 (GOWN DISPOSABLE) ×2
HANDPIECE INTERPULSE COAX TIP (DISPOSABLE) ×1
HOLDER FOLEY CATH W/STRAP (MISCELLANEOUS) IMPLANT
HOOD PEEL AWAY T7 (MISCELLANEOUS) ×3 IMPLANT
KIT TURNOVER KIT A (KITS) IMPLANT
MANIFOLD NEPTUNE II (INSTRUMENTS) ×1 IMPLANT
NDL HYPO 22X1.5 SAFETY MO (MISCELLANEOUS) ×1 IMPLANT
NEEDLE HYPO 22X1.5 SAFETY MO (MISCELLANEOUS) ×1 IMPLANT
NEEDLE SAFETY HYPO 22GAX1.5 (MISCELLANEOUS) ×1
NS IRRIG 1000ML POUR BTL (IV SOLUTION) ×1 IMPLANT
PACK TOTAL KNEE CUSTOM (KITS) ×1 IMPLANT
PAD ARMBOARD 7.5X6 YLW CONV (MISCELLANEOUS) ×1 IMPLANT
PATELLA MEDIAL ATTUN 35MM KNEE (Knees) IMPLANT
PIN STEINMAN FIXATION KNEE (PIN) IMPLANT
PROTECTOR NERVE ULNAR (MISCELLANEOUS) ×1 IMPLANT
SET HNDPC FAN SPRY TIP SCT (DISPOSABLE) ×1 IMPLANT
SPIKE FLUID TRANSFER (MISCELLANEOUS) ×2 IMPLANT
SUT ETHIBOND NAB CT1 #1 30IN (SUTURE) ×1 IMPLANT
SUT VIC AB 0 CT1 36 (SUTURE) ×1 IMPLANT
SUT VIC AB 2-0 CT1 27 (SUTURE) ×1
SUT VIC AB 2-0 CT1 TAPERPNT 27 (SUTURE) ×1 IMPLANT
SUT VICRYL AB 3-0 FS1 BRD 27IN (SUTURE) ×1 IMPLANT
SUT VLOC 180 0 24IN GS25 (SUTURE) ×1 IMPLANT
TIBIA ATTUNE KNEE SYS BASE SZ6 (Knees) ×1 IMPLANT
TRAY FOLEY MTR SLVR 16FR STAT (SET/KITS/TRAYS/PACK) IMPLANT
WATER STERILE IRR 1000ML POUR (IV SOLUTION) ×1 IMPLANT
WRAP KNEE MAXI GEL POST OP (GAUZE/BANDAGES/DRESSINGS) ×1 IMPLANT
YANKAUER SUCT BULB TIP NO VENT (SUCTIONS) ×1 IMPLANT

## 2022-07-22 NOTE — Transfer of Care (Signed)
Immediate Anesthesia Transfer of Care Note  Patient: Holly Davidson  Procedure(s) Performed: LEFT TOTAL KNEE ARTHROPLASTY (Left: Knee)  Patient Location: PACU  Anesthesia Type:Spinal  Level of Consciousness: awake, alert , oriented, and patient cooperative  Airway & Oxygen Therapy: Patient Spontanous Breathing and Patient connected to face mask oxygen  Post-op Assessment: Report given to RN and Post -op Vital signs reviewed and stable  Post vital signs: Reviewed and stable  Last Vitals:  Vitals Value Taken Time  BP 107/69 07/22/22 1325  Temp    Pulse 55 07/22/22 1326  Resp 11 07/22/22 1326  SpO2 100 % 07/22/22 1326  Vitals shown include unvalidated device data.  Last Pain:  Vitals:   07/22/22 1025  TempSrc:   PainSc: 0-No pain         Complications: No notable events documented.

## 2022-07-22 NOTE — Interval H&P Note (Signed)
History and Physical Interval Note:  07/22/2022 9:18 AM  Holly Davidson  has presented today for surgery, with the diagnosis of LEFT KNEE DEGENERATIVE JOINT DISEASE.  The various methods of treatment have been discussed with the patient and family. After consideration of risks, benefits and other options for treatment, the patient has consented to  Procedure(s): LEFT TOTAL KNEE ARTHROPLASTY (Left) as a surgical intervention.  The patient's history has been reviewed, patient examined, no change in status, stable for surgery.  I have reviewed the patient's chart and labs.  Questions were answered to the patient's satisfaction.     Hessie Dibble

## 2022-07-22 NOTE — Op Note (Signed)
PREOP DIAGNOSIS: DJD LEFT KNEE POSTOP DIAGNOSIS:  same PROCEDURE: LEFT TKR ANESTHESIA: Spinal and MAC ATTENDING SURGEON: Hessie Dibble ASSISTANT: Loni Dolly PA  INDICATIONS FOR PROCEDURE: Holly Davidson is a 57 y.o. female who has struggled for a long time with pain due to degenerative arthritis of the left knee.  The patient has failed many conservative non-operative measures and at this point has pain which limits the ability to sleep and walk.  The patient is offered total knee replacement.  Informed operative consent was obtained after discussion of possible risks of anesthesia, infection, neurovascular injury, DVT, and death.  The importance of the post-operative rehabilitation protocol to optimize result was stressed extensively with the patient.  SUMMARY OF FINDINGS AND PROCEDURE:  Holly Davidson was taken to the operative suite where under the above anesthesia a left knee replacement was performed.  There were advanced degenerative changes and the bone quality was good.  We used the DePuyAttune system and placed size 4 femur, 6 tibia, 35 mm all polyethylene patella, and a size 8 mm spacer.  Loni Dolly PA-C assisted throughout and was invaluable to the completion of the case in that he helped retract and maintain exposure while I placed the components.  He also helped close thereby minimizing OR time.  The patient was admitted for appropriate post-op care to include perioperative antibiotics and mechanical and pharmacologic measures for DVT prophylaxis.  DESCRIPTION OF PROCEDURE:  Holly Davidson was taken to the operative suite where the above anesthesia was applied.  The patient was positioned supine and prepped and draped in normal sterile fashion.  An appropriate time out was performed.  After the administration of kefzol pre-op antibiotic the leg was elevated and exsanguinated and a tourniquet inflated.  A standard longitudinal incision was made on the anterior knee.  Dissection was  carried down to the extensor mechanism.  All appropriate anti-infective measures were used including the pre-operative antibiotic, betadine impregnated drape, and closed hooded exhaust systems for each member of the surgical team.  A medial parapatellar incision was made in the extensor mechanism and the knee cap flipped and the knee flexed.  Some residual meniscal tissues were removed along with any remaining ACL/PCL tissue.  A guide was placed on the tibia and a flat cut was made on it's superior surface.  An intramedullary guide was placed in the femur and was utilized to make anterior and posterior cuts creating an appropriate flexion gap.  A second intramedullary guide was placed in the femur to make a distal cut properly balancing the knee with an extension gap equal to the flexion gap.  The three bones sized to the above mentioned sizes and the appropriate guides were placed and utilized.  A trial reduction was done and the knee easily came to full extension and the patella tracked well on flexion.  The trial components were removed and all bones were cleaned with pulsatile lavage and then dried thoroughly.  Cement was mixed and was pressurized onto the bones followed by placement of the aforementioned components.  Excess cement was trimmed and pressure was held on the components until the cement had hardened.  The tourniquet was deflated and a small amount of bleeding was controlled with cautery and pressure.  The knee was irrigated thoroughly.  The extensor mechanism was re-approximated with #1 ethibond in interrupted fashion.  The knee was flexed and the repair was solid.  The subcutaneous tissues were re-approximated with #0 and #2-0 vicryl and the skin  closed with a subcuticular stitch and steristrips.  A sterile dressing was applied.  Intraoperative fluids, EBL, and tourniquet time can be obtained from anesthesia records.  DISPOSITION:  The patient was taken to recovery room in stable condition and  scheduled to potentially go home same day depending on ability to walk and tolerate liquids.  Hessie Dibble 07/22/2022, 12:55 PM

## 2022-07-22 NOTE — Progress Notes (Signed)
PT Note  Patient Details Name: ELSHA PANGBURN MRN: [id number] DOB: Oct 07, 1965   Cancelled Treatment:      PT made 2 attempts to engage pt with this evening, initially pt reported that she was unable to feel or move her LLE ~ 1730 and was in 4/10 pain with nurse and family present. PT returned 1 hr later and pt c/o 8/10 pain, nurse provided additional pain medication, PT assessed for sensation with pt reportedly no abn sensation L distal and proximal LE. Pt required PROM for L SLR and began to scream in pain. Pt is currently not appropriate to progress with PT evaluation today. PT anticipates improved pain management, active LLE motor control and coordination and ability to actively participate with skilled PT assessment 07/23/2022. Pt ed provided and nursing aware.  Baird Lyons, PT   Adair Patter 07/22/2022, 6:44 PM

## 2022-07-22 NOTE — Plan of Care (Signed)
  Problem: Safety: Goal: Ability to remain free from injury will improve Outcome: Progressing   Problem: Pain Management: Goal: Pain level will decrease with appropriate interventions Outcome: Progressing   Problem: Activity: Goal: Range of joint motion will improve Outcome: Progressing   Problem: Education: Goal: Knowledge of the prescribed therapeutic regimen will improve Outcome: Progressing   

## 2022-07-22 NOTE — Anesthesia Preprocedure Evaluation (Addendum)
Anesthesia Evaluation  Patient identified by MRN, date of birth, ID band Patient awake    Reviewed: Allergy & Precautions, NPO status , Patient's Chart, lab work & pertinent test results  Airway Mallampati: II  TM Distance: >3 FB Neck ROM: Full    Dental  (+) Chipped,    Pulmonary former smoker   breath sounds clear to auscultation       Cardiovascular hypertension, + Valvular Problems/Murmurs  Rhythm:Regular Rate:Normal     Neuro/Psych  Neuromuscular disease  negative psych ROS   GI/Hepatic Neg liver ROS,GERD  ,,  Endo/Other  negative endocrine ROS    Renal/GU negative Renal ROS     Musculoskeletal  (+) Arthritis ,  Fibromyalgia -  Abdominal Normal abdominal exam  (+)   Peds  Hematology negative hematology ROS (+)   Anesthesia Other Findings   Reproductive/Obstetrics                             Anesthesia Physical Anesthesia Plan  ASA: 3  Anesthesia Plan: Spinal   Post-op Pain Management: Regional block*   Induction: Intravenous  PONV Risk Score and Plan: 0 and Propofol infusion  Airway Management Planned: Natural Airway and Simple Face Mask  Additional Equipment: None  Intra-op Plan:   Post-operative Plan:   Informed Consent: I have reviewed the patients History and Physical, chart, labs and discussed the procedure including the risks, benefits and alternatives for the proposed anesthesia with the patient or authorized representative who has indicated his/her understanding and acceptance.       Plan Discussed with: CRNA  Anesthesia Plan Comments:        Anesthesia Quick Evaluation

## 2022-07-22 NOTE — Anesthesia Procedure Notes (Signed)
Spinal  Patient location during procedure: OR Start time: 07/22/2022 11:28 AM End time: 07/22/2022 11:33 AM Reason for block: surgical anesthesia Staffing Performed: resident/CRNA  Resident/CRNA: Garrel Ridgel, CRNA Performed by: Garrel Ridgel, CRNA Authorized by: Effie Berkshire, MD   Preanesthetic Checklist Completed: patient identified, IV checked, site marked, risks and benefits discussed, surgical consent, monitors and equipment checked, pre-op evaluation and timeout performed Spinal Block Patient position: sitting Prep: ChloraPrep Patient monitoring: heart rate, cardiac monitor, continuous pulse ox and blood pressure Approach: midline Location: L4-5 Injection technique: single-shot Needle Needle type: Pencan  Needle gauge: 24 G Needle length: 9 cm Needle insertion depth: 5 cm Assessment Sensory level: T4 Events: CSF return

## 2022-07-22 NOTE — Plan of Care (Signed)
  Problem: Education: Goal: Knowledge of the prescribed therapeutic regimen will improve Outcome: Progressing   Problem: Activity: Goal: Ability to avoid complications of mobility impairment will improve Outcome: Progressing   Problem: Pain Management: Goal: Pain level will decrease with appropriate interventions Outcome: Progressing   Problem: Education: Goal: Knowledge of General Education information will improve Description: Including pain rating scale, medication(s)/side effects and non-pharmacologic comfort measures Outcome: Progressing   Problem: Activity: Goal: Risk for activity intolerance will decrease Outcome: Progressing   Problem: Nutrition: Goal: Adequate nutrition will be maintained Outcome: Progressing   Problem: Pain Managment: Goal: General experience of comfort will improve Outcome: Progressing   Problem: Skin Integrity: Goal: Risk for impaired skin integrity will decrease Outcome: Progressing

## 2022-07-22 NOTE — Anesthesia Procedure Notes (Signed)
Anesthesia Regional Block: Adductor canal block   Pre-Anesthetic Checklist: , timeout performed,  Correct Patient, Correct Site, Correct Laterality,  Correct Procedure, Correct Position, site marked,  Risks and benefits discussed,  Surgical consent,  Pre-op evaluation,  At surgeon's request and post-op pain management  Laterality: Left  Prep: chloraprep       Needles:  Injection technique: Single-shot  Needle Type: Echogenic Stimulator Needle     Needle Length: 9cm  Needle Gauge: 21     Additional Needles:   Procedures:,,,, ultrasound used (permanent image in chart),,    Narrative:  Start time: 07/22/2022 10:15 AM End time: 07/22/2022 10:20 AM Injection made incrementally with aspirations every 5 mL.  Performed by: Personally  Anesthesiologist: Effie Berkshire, MD  Additional Notes: Discussed risks and benefits of the nerve block in detail, including but not limited vascular injury, permanent nerve damage and infection.   Patient tolerated the procedure well. Local anesthetic introduced in an incremental fashion under minimal resistance after negative aspirations. No paresthesias were elicited. After completion of the procedure, no acute issues were identified and patient continued to be monitored by RN.

## 2022-07-22 NOTE — Anesthesia Postprocedure Evaluation (Signed)
Anesthesia Post Note  Patient: Holly Davidson  Procedure(s) Performed: LEFT TOTAL KNEE ARTHROPLASTY (Left: Knee)     Patient location during evaluation: PACU Anesthesia Type: Spinal Level of consciousness: oriented and awake and alert Pain management: pain level controlled Vital Signs Assessment: post-procedure vital signs reviewed and stable Respiratory status: spontaneous breathing, respiratory function stable and patient connected to nasal cannula oxygen Cardiovascular status: blood pressure returned to baseline and stable Postop Assessment: no headache, no backache and no apparent nausea or vomiting Anesthetic complications: no  No notable events documented.  Last Vitals:  Vitals:   07/22/22 1400 07/22/22 1415  BP: 124/81 123/78  Pulse:  (!) 49  Resp: 12 (!) 9  Temp:  36.6 C  SpO2: 100% 97%    Last Pain:  Vitals:   07/22/22 1345  TempSrc:   PainSc: 0-No pain                 Effie Berkshire

## 2022-07-23 MED ORDER — OXYCODONE HCL 5 MG PO TABS: 5.0000 mg | ORAL_TABLET | Freq: Four times a day (QID) | ORAL | 0 refills | Status: AC | PRN

## 2022-07-23 MED ORDER — ASPIRIN 81 MG PO CHEW: 81.0000 mg | CHEWABLE_TABLET | Freq: Two times a day (BID) | ORAL | 0 refills | Status: AC

## 2022-07-23 NOTE — Progress Notes (Signed)
Loni Dolly PA notified of PT concerns with patients mobility.  D FranklinRN

## 2022-07-23 NOTE — Discharge Summary (Cosign Needed Addendum)
Patient ID: Holly Davidson MRN: [id number] DOB/AGE: 07/10/1965 57 y.o.  Admit date: 07/22/2022 Discharge date: 07/25/2022  Admission Diagnoses:  Principal Problem:   Primary osteoarthritis of left knee   Discharge Diagnoses:  Same  Past Medical History:  Diagnosis Date   Anemia    Arthritis    Back pain    "OFF & ON"   Chondromalacia of both patellae 03/28/2016   Mild   DDD (degenerative disc disease), lumbar 03/28/2016   Difficulty sleeping    occasional - takes med prn   Dizziness    per patient    Fibromyalgia    GERD (gastroesophageal reflux disease)    Heart murmur    Herniated lumbar intervertebral disc    Hypertension    Lupus ([place])    "presents as joint pain, occ. rash   Lupus ([place])    Lupus arthritis ([place])    MCTD (mixed connective tissue disease) ([place]) 03/28/2016   + ANA, + Ro, + RNP   Osteoarthritis of both hands 03/28/2016   Osteoarthritis of both knees 03/28/2016   Moderate    Post-menopausal atrophic vaginitis    Pre-diabetes     Surgeries: Procedure(s): LEFT TOTAL KNEE ARTHROPLASTY on 07/22/2022   Consultants:   Discharged Condition: Improved  Hospital Course: [name] is an 57 y.o. female who was admitted 07/22/2022 for operative treatment ofPrimary osteoarthritis of left knee. Patient has severe unremitting pain that affects sleep, daily activities, and work/hobbies. After pre-op clearance the patient was taken to the operating room on 07/22/2022 and underwent  Procedure(s): LEFT TOTAL KNEE ARTHROPLASTY.    Patient was given perioperative antibiotics:  Anti-infectives (From admission, onward)    Start     Dose/Rate Route Frequency Ordered Stop   07/22/22 1730  ceFAZolin (ANCEF) IVPB 2g/100 mL premix        2 g 200 mL/hr over 30 Minutes Intravenous Every 6 hours 07/22/22 1615 07/22/22 2347   07/22/22 0915  ceFAZolin (ANCEF) IVPB 2g/100 mL premix        2 g 200 mL/hr over 30 Minutes Intravenous On call to O.R. 07/22/22 0902 07/22/22 1138         Patient was given sequential compression devices, early ambulation, and chemoprophylaxis to prevent DVT.  Patient benefited maximally from hospital stay and there were no complications.    Recent vital signs: Patient Vitals for the past 24 hrs:  BP Temp Temp src Pulse Resp SpO2 Height Weight  07/23/22 0556 107/71 (!) 97.5 F (36.4 C) -- 66 19 96 % -- --  07/23/22 0224 114/63 97.9 F (36.6 C) -- 72 18 94 % -- --  07/22/22 2239 112/67 98.4 F (36.9 C) Oral 66 20 99 % -- --  07/22/22 1759 120/74 97.8 F (36.6 C) Oral 83 20 96 % -- --  07/22/22 1600 (!) 143/73 (!) 97.5 F (36.4 C) Axillary (!) 56 16 100 % -- --  07/22/22 1545 -- -- -- (!) 57 15 100 % -- --  07/22/22 1530 127/71 -- -- (!) 54 20 96 % -- --  07/22/22 1515 135/78 -- -- (!) 52 20 96 % -- --  07/22/22 1500 127/72 -- -- (!) 51 10 96 % -- --  07/22/22 1445 134/73 -- -- (!) 49 12 97 % -- --  07/22/22 1430 134/73 -- -- (!) 51 18 99 % -- --  07/22/22 1415 123/78 97.8 F (36.6 C) -- (!) 49 (!) 9 97 % -- --  07/22/22 1400 124/81 -- -- --  12 100 % -- --  07/22/22 1345 125/68 -- -- (!) 56 11 100 % -- --  07/22/22 1330 118/69 -- -- (!) 58 15 100 % -- --  07/22/22 1325 107/69 (!) 97.2 F (36.2 C) -- 63 16 100 % -- --  07/22/22 1025 110/69 -- -- 63 11 99 % -- --  07/22/22 1020 110/70 -- -- 64 17 95 % -- --  07/22/22 1015 126/71 -- -- (!) 57 (!) 9 99 % -- --  07/22/22 0924 (!) 117/99 98.3 F (36.8 C) Oral (!) 58 18 99 % 5' 5.5" (1.664 m) 93.9 kg     Recent laboratory studies: No results for input(s): "WBC", "HGB", "HCT", "PLT", "NA", "K", "CL", "CO2", "BUN", "CREATININE", "GLUCOSE", "INR", "CALCIUM" in the last 72 hours.  Invalid input(s): "PT", "2"   Discharge Medications:   Allergies as of 07/23/2022   No Known Allergies      Medication List     TAKE these medications    antiseptic oral rinse Liqd 15 mLs by Mouth Rinse route as needed for dry mouth.   aspirin 81 MG chewable tablet Chew 1 tablet (81 mg  total) by mouth 2 (two) times daily. For 2 weeks then once a day for 2 weeks for DVT prevention.   azaTHIOprine 50 MG tablet Commonly known as: IMURAN TAKE 2 TABLETS(100 MG) BY MOUTH DAILY   CALCIUM 600+D3 PO Take 1 tablet by mouth 2 (two) times daily.   carboxymethylcellulose 0.5 % Soln Commonly known as: REFRESH PLUS Place 1 drop into both eyes 3 (three) times daily as needed (dry eyes).   ferrous sulfate 325 (65 FE) MG tablet Take 325 mg by mouth daily.   furosemide 20 MG tablet Commonly known as: LASIX Take 20 mg by mouth 2 (two) times daily.   hydrochlorothiazide 25 MG tablet Commonly known as: HYDRODIURIL Take 25 mg by mouth daily.   Linzess 72 MCG capsule Generic drug: linaclotide Take 72 mcg by mouth every morning.   Mounjaro 7.5 MG/0.5ML Pen Generic drug: tirzepatide Inject 7.5 mg into the skin every Friday.   multivitamin with minerals Tabs tablet Take 1 tablet by mouth daily.   naloxone 4 MG/0.1ML Liqd nasal spray kit Commonly known as: NARCAN Place 0.4 mg into the nose once.   nortriptyline 50 MG capsule Commonly known as: PAMELOR Take 50 mg by mouth at bedtime as needed for sleep.   omeprazole 40 MG capsule Commonly known as: PRILOSEC Take 40 mg by mouth 2 (two) times daily as needed (heartburn).   oxyCODONE 15 MG immediate release tablet Commonly known as: ROXICODONE Take 15 mg by mouth in the morning, at noon, in the evening, and at bedtime. What changed: Another medication with the same name was added. Make sure you understand how and when to take each.   oxyCODONE 5 MG immediate release tablet Commonly known as: Oxy IR/ROXICODONE Take 1 tablet (5 mg total) by mouth every 6 (six) hours as needed for breakthrough pain (in addition to baseline pain meds for post op pain). What changed: You were already taking a medication with the same name, and this prescription was added. Make sure you understand how and when to take each.   pilocarpine 5 MG  tablet Commonly known as: SALAGEN TAKE 1 TABLET(5 MG) BY MOUTH THREE TIMES DAILY AS NEEDED   potassium chloride SA 20 MEQ tablet Commonly known as: KLOR-CON M Please call for office visit [phone] TAKE 2 TABLETS(40 MEQ) BY MOUTH TWICE DAILY What  changed:  how much to take how to take this when to take this additional instructions   pregabalin 200 MG capsule Commonly known as: LYRICA Take 200 mg by mouth 3 (three) times daily.   rosuvastatin 10 MG tablet Commonly known as: Crestor Take 1 tablet (10 mg total) by mouth daily.   tiZANidine 2 MG tablet Commonly known as: ZANAFLEX Take 2 mg by mouth 3 (three) times daily.   Vitamin D3 50 MCG (2000 UT) Caps Generic drug: Cholecalciferol Take 2,000 Units by mouth daily.               Durable Medical Equipment  (From admission, onward)           Start     Ordered   07/22/22 1616  DME Walker rolling  Once       Question:  Patient needs a walker to treat with the following condition  Answer:  Primary osteoarthritis of left knee   07/22/22 1615   07/22/22 1616  DME 3 n 1  Once        07/22/22 1615   07/22/22 1616  DME Bedside commode  Once       Question:  Patient needs a bedside commode to treat with the following condition  Answer:  Primary osteoarthritis of left knee   07/22/22 1615            Diagnostic Studies: DG Chest 2 View  Result Date: 07/16/2022 CLINICAL DATA:  Preoperative evaluation. EXAM: CHEST - 2 VIEW COMPARISON:  June 30, 2018 FINDINGS: The heart size and mediastinal contours are within normal limits. Very mild, stable linear scarring is seen along the lateral aspect of the right apex. There is no evidence of acute infiltrate, pleural effusion or pneumothorax. Radiopaque surgical clips are seen within the right upper quadrant. The visualized skeletal structures are unremarkable. IMPRESSION: No active cardiopulmonary disease. Electronically Signed   By: Virgina Norfolk M.D.   On: 07/16/2022  01:31    Disposition: Discharge disposition: 01-Home or Self Care       Discharge Instructions     Call MD / Call 911   Complete by: As directed    If you experience chest pain or shortness of breath, CALL 911 and be transported to the hospital emergency room.  If you develope a fever above 101 F, pus (white drainage) or increased drainage or redness at the wound, or calf pain, call your surgeon's office.   Constipation Prevention   Complete by: As directed    Drink plenty of fluids.  Prune juice may be helpful.  You may use a stool softener, such as Colace (over the counter) 100 mg twice a day.  Use MiraLax (over the counter) for constipation as needed.   Diet - low sodium heart healthy   Complete by: As directed    Discharge instructions   Complete by: As directed    INSTRUCTIONS AFTER JOINT REPLACEMENT   Remove items at home which could result in a fall. This includes throw rugs or furniture in walking pathways ICE to the affected joint every three hours while awake for 30 minutes at a time, for at least the first 3-5 days, and then as needed for pain and swelling.  Continue to use ice for pain and swelling. You may notice swelling that will progress down to the foot and ankle.  This is normal after surgery.  Elevate your leg when you are not up walking on it.   Continue to use  the breathing machine you got in the hospital (incentive spirometer) which will help keep your temperature down.  It is common for your temperature to cycle up and down following surgery, especially at night when you are not up moving around and exerting yourself.  The breathing machine keeps your lungs expanded and your temperature down.   DIET:  As you were doing prior to hospitalization, we recommend a well-balanced diet.  DRESSING / WOUND CARE / SHOWERING  You may shower 3 days after surgery, but keep the wounds dry during showering.  You may use an occlusive plastic wrap (Press'n Seal for example), NO  SOAKING/SUBMERGING IN THE BATHTUB.  If the bandage gets wet, change with a clean dry gauze.  If the incision gets wet, pat the wound dry with a clean towel.  ACTIVITY  Increase activity slowly as tolerated, but follow the weight bearing instructions below.   No driving for 6 weeks or until further direction given by your physician.  You cannot drive while taking narcotics.  No lifting or carrying greater than 10 lbs. until further directed by your surgeon. Avoid periods of inactivity such as sitting longer than an hour when not asleep. This helps prevent blood clots.  You may return to work once you are authorized by your doctor.     WEIGHT BEARING   Weight bearing as tolerated with assist device (walker, cane, etc) as directed, use it as long as suggested by your surgeon or therapist, typically at least 4-6 weeks.   EXERCISES  Results after joint replacement surgery are often greatly improved when you follow the exercise, range of motion and muscle strengthening exercises prescribed by your doctor. Safety measures are also important to protect the joint from further injury. Any time any of these exercises cause you to have increased pain or swelling, decrease what you are doing until you are comfortable again and then slowly increase them. If you have problems or questions, call your caregiver or physical therapist for advice.   Rehabilitation is important following a joint replacement. After just a few days of immobilization, the muscles of the leg can become weakened and shrink (atrophy).  These exercises are designed to build up the tone and strength of the thigh and leg muscles and to improve motion. Often times heat used for twenty to thirty minutes before working out will loosen up your tissues and help with improving the range of motion but do not use heat for the first two weeks following surgery (sometimes heat can increase post-operative swelling).   These exercises can be done on a  training (exercise) mat, on the floor, on a table or on a bed. Use whatever works the best and is most comfortable for you.    Use music or television while you are exercising so that the exercises are a pleasant break in your day. This will make your life better with the exercises acting as a break in your routine that you can look forward to.   Perform all exercises about fifteen times, three times per day or as directed.  You should exercise both the operative leg and the other leg as well.  Exercises include:   Quad Sets - Tighten up the muscle on the front of the thigh (Quad) and hold for 5-10 seconds.   Straight Leg Raises - With your knee straight (if you were given a brace, keep it on), lift the leg to 60 degrees, hold for 3 seconds, and slowly lower the leg.  Perform this exercise against resistance later as your leg gets stronger.  Leg Slides: Lying on your back, slowly slide your foot toward your buttocks, bending your knee up off the floor (only go as far as is comfortable). Then slowly slide your foot back down until your leg is flat on the floor again.  Angel Wings: Lying on your back spread your legs to the side as far apart as you can without causing discomfort.  Hamstring Strength:  Lying on your back, push your heel against the floor with your leg straight by tightening up the muscles of your buttocks.  Repeat, but this time bend your knee to a comfortable angle, and push your heel against the floor.  You may put a pillow under the heel to make it more comfortable if necessary.   A rehabilitation program following joint replacement surgery can speed recovery and prevent re-injury in the future due to weakened muscles. Contact your doctor or a physical therapist for more information on knee rehabilitation.    CONSTIPATION  Constipation is defined medically as fewer than three stools per week and severe constipation as less than one stool per week.  Even if you have a regular bowel  pattern at home, your normal regimen is likely to be disrupted due to multiple reasons following surgery.  Combination of anesthesia, postoperative narcotics, change in appetite and fluid intake all can affect your bowels.   YOU MUST use at least one of the following options; they are listed in order of increasing strength to get the job done.  They are all available over the counter, and you may need to use some, POSSIBLY even all of these options:    Drink plenty of fluids (prune juice may be helpful) and high fiber foods Colace 100 mg by mouth twice a day  Senokot for constipation as directed and as needed Dulcolax (bisacodyl), take with full glass of water  Miralax (polyethylene glycol) once or twice a day as needed.  If you have tried all these things and are unable to have a bowel movement in the first 3-4 days after surgery call either your surgeon or your primary doctor.    If you experience loose stools or diarrhea, hold the medications until you stool forms back up.  If your symptoms do not get better within 1 week or if they get worse, check with your doctor.  If you experience "the worst abdominal pain ever" or develop nausea or vomiting, please contact the office immediately for further recommendations for treatment.   ITCHING:  If you experience itching with your medications, try taking only a single pain pill, or even half a pain pill at a time.  You can also use Benadryl over the counter for itching or also to help with sleep.   TED HOSE STOCKINGS:  Use stockings on both legs until for at least 2 weeks or as directed by physician office. They may be removed at night for sleeping.  MEDICATIONS:  See your medication summary on the "After Visit Summary" that nursing will review with you.  You may have some home medications which will be placed on hold until you complete the course of blood thinner medication.  It is important for you to complete the blood thinner medication as  prescribed.  PRECAUTIONS:  If you experience chest pain or shortness of breath - call 911 immediately for transfer to the hospital emergency department.   If you develop a fever greater that 101 F, purulent drainage  from wound, increased redness or drainage from wound, foul odor from the wound/dressing, or calf pain - CONTACT YOUR SURGEON.                                                   FOLLOW-UP APPOINTMENTS:  If you do not already have a post-op appointment, please call the office for an appointment to be seen by your surgeon.  Guidelines for how soon to be seen are listed in your "After Visit Summary", but are typically between 1-4 weeks after surgery.  OTHER INSTRUCTIONS:   Knee Replacement:  Do not place pillow under knee, focus on keeping the knee straight while resting. CPM instructions: 0-90 degrees, 2 hours in the morning, 2 hours in the afternoon, and 2 hours in the evening. Place foam block, curve side up under heel at all times except when in CPM or when walking.  DO NOT modify, tear, cut, or change the foam block in any way.  POST-OPERATIVE OPIOID TAPER INSTRUCTIONS: It is important to wean off of your opioid medication as soon as possible. If you do not need pain medication after your surgery it is ok to stop day one. Opioids include: Codeine, Hydrocodone(Norco, Vicodin), Oxycodone(Percocet, oxycontin) and hydromorphone amongst others.  Long term and even short term use of opiods can cause: Increased pain response Dependence Constipation Depression Respiratory depression And more.  Withdrawal symptoms can include Flu like symptoms Nausea, vomiting And more Techniques to manage these symptoms Hydrate well Eat regular healthy meals Stay active Use relaxation techniques(deep breathing, meditating, yoga) Do Not substitute Alcohol to help with tapering If you have been on opioids for less than two weeks and do not have pain than it is ok to stop all together.  Plan to  wean off of opioids This plan should start within one week post op of your joint replacement. Maintain the same interval or time between taking each dose and first decrease the dose.  Cut the total daily intake of opioids by one tablet each day Next start to increase the time between doses. The last dose that should be eliminated is the evening dose.     MAKE SURE YOU:  Understand these instructions.  Get help right away if you are not doing well or get worse.    Thank you for letting us be a part of your medical care team.  It is a privilege we respect greatly.  We hope these instructions will help you stay on track for a fast and full recovery!   Increase activity slowly as tolerated   Complete by: As directed    Post-operative opioid taper instructions:   Complete by: As directed    POST-OPERATIVE OPIOID TAPER INSTRUCTIONS: It is important to wean off of your opioid medication as soon as possible. If you do not need pain medication after your surgery it is ok to stop day one. Opioids include: Codeine, Hydrocodone(Norco, Vicodin), Oxycodone(Percocet, oxycontin) and hydromorphone amongst others.  Long term and even short term use of opiods can cause: Increased pain response Dependence Constipation Depression Respiratory depression And more.  Withdrawal symptoms can include Flu like symptoms Nausea, vomiting And more Techniques to manage these symptoms Hydrate well Eat regular healthy meals Stay active Use relaxation techniques(deep breathing, meditating, yoga) Do Not substitute Alcohol to help with tapering If you have been on opioids  for less than two weeks and do not have pain than it is ok to stop all together.  Plan to wean off of opioids This plan should start within one week post op of your joint replacement. Maintain the same interval or time between taking each dose and first decrease the dose.  Cut the total daily intake of opioids by one tablet each day Next  start to increase the time between doses. The last dose that should be eliminated is the evening dose.           Follow-up Information     Melrose Nakayama, MD. Schedule an appointment as soon as possible for a visit in 2 week(s).   Specialty: Orthopedic Surgery Contact information: [place] [address] [phone]                  Signed: Larwance Sachs Nesta Kimple 07/23/2022, 8:11 AM

## 2022-07-23 NOTE — TOC Transition Note (Signed)
Transition of Care [facility] - CM/SW Discharge Note  Patient Details  Name: Holly Davidson MRN: [id number] Date of Birth: 1966/03/24  [facility]) CM/SW Contact:  Sherie Don, LCSW Phone Number: 07/23/2022, 8:50 AM  Clinical Narrative: Patient is expected to discharge home after working with PT. CSW met with patient to confirm discharge plan. Patient will go home with HHPT, which was prearranged with [place]. Patient has a rolling walker at home, so there are no DME needs at this time. TOC signing off.    Final next level of care: Home w Home Health Services Barriers to Discharge: No Barriers Identified  Patient Goals and CMS Choice CMS Medicare.gov Compare Post Acute Care list provided to:: Patient Choice offered to / list presented to : Patient  Discharge Plan and Services Additional resources added to the After Visit Summary for      DME Arranged: N/A DME Agency: NA HH Arranged: PT HH Agency: [place] Representative spoke with at [place] in orthopedist's office  Social Determinants of Health ([place]) Interventions [place]: Low Risk  (07/22/2022)  Tobacco Use: Medium Risk (07/22/2022)   Readmission Risk Interventions     No data to display

## 2022-07-23 NOTE — Evaluation (Addendum)
Physical Therapy Evaluation Patient Details Name: ROSSI BEECHLER MRN: [id number] DOB: 1966/03/01 Today's Date: 07/23/2022  History of Present Illness  Pt is a 57 y/o F admitted on 07/22/22 for scheduled L TKA. PMH: lumbar DDD, dizziness, fibromyalgia, heart murmur, lupus, herniated lumbar intervertebral disc, MCTD, pre-diabetes  Clinical Impression  Pt seen for PT evaluation with pt agreeable. Pt reports prior to admission she was mod I with SPC, driving, living with her son & 39 y/o grandson. On this date, pt is able to perform LLE ankle pump but otherwise trace movement noted in LLE as well as decreased sensation in L knee>L thigh. Pt requires min assist with hospital bed features to complete supine>sit, is unsafe to attempt STS but is able to complete lateral scoot to drop arm recliner with min assist & max cuing with extra time. Pt with significant pain in LLE with all mobility despite being premedicated, even crying at times (nurse & MD notified).        Recommendations for follow up therapy are one component of a multi-disciplinary discharge planning process, led by the attending physician.  Recommendations may be updated based on patient status, additional functional criteria and insurance authorization.  Follow Up Recommendations Skilled nursing-short term rehab (<3 hours/day) Can patient physically be transported by private vehicle: No    Assistance Recommended at Discharge Frequent or constant Supervision/Assistance  Patient can return home with the following  Help with stairs or ramp for entrance;Two people to help with bathing/dressing/bathroom;Two people to help with walking and/or transfers;Assist for transportation;Assistance with cooking/housework    Equipment Recommendations None recommended by PT (TBD in next venue)  Recommendations for Other Services  OT consult    Functional Status Assessment Patient has had a recent decline in their functional status and demonstrates the  ability to make significant improvements in function in a reasonable and predictable amount of time.     Precautions / Restrictions Precautions Precautions: Fall;Knee Restrictions Weight Bearing Restrictions: Yes LLE Weight Bearing: Weight bearing as tolerated      Mobility  Bed Mobility Overal bed mobility: Needs Assistance Bed Mobility: Supine to Sit     Supine to sit: Min assist     General bed mobility comments: PT provides support/assistance with moving LLE towards EOB, pt relies heavily on HOB elevated & bed rails, extra time to complete.    Transfers Overall transfer level: Needs assistance Equipment used: None Transfers: Bed to chair/wheelchair/BSC            Lateral/Scoot Transfers: Min assist General transfer comment: Pt completes lateral scoot bed>drop arm recliner with min assist with PT providing cuing for head/hips relationship & sequencing; pt requires extra time.    Ambulation/Gait                  Stairs            Wheelchair Mobility    Modified Rankin (Stroke Patients Only)       Balance Overall balance assessment: Needs assistance Sitting-balance support: Feet supported Sitting balance-Leahy Scale: Fair                                       Pertinent Vitals/Pain Pain Assessment Pain Assessment: Faces Faces Pain Scale: Hurts worst (Pt reports 8/10 at rest at beginning of session, behaviors demonstrating 10/10 with movement) Pain Location: L knee Pain Descriptors / Indicators: Discomfort, Crying, Grimacing, Guarding Pain Intervention(s):  Repositioned, Premedicated before session, Monitored during session, Limited activity within patient's tolerance (notified RN & MD via secure chat, ice applied at end of session)    Home Living Family/patient expects to be discharged to:: Private residence Living Arrangements: Children Available Help at Discharge: Family;Available 24 hours/day Type of Home:  Apartment Home Access: Ramped entrance       Home Layout: One level Home Equipment: Conservation officer, nature (2 wheels);Cane - single point Additional Comments: Pt reports she lives with her son & 93 y/o grandson (grandson is home & able to assist during the day).    Prior Function Prior Level of Function : Driving;Independent/Modified Independent             Mobility Comments: Pt reports she's ambulatory with hurry-cane, denies falls but endorses "stumbling", still driving.       Hand Dominance        Extremity/Trunk Assessment   Upper Extremity Assessment Upper Extremity Assessment: Overall WFL for tasks assessed    Lower Extremity Assessment Lower Extremity Assessment: LLE deficits/detail LLE Deficits / Details: Pt able to perform LLE anke pump, trace activation when pt attempts quad set, heel slide. Pt reports decreased sensation to light touch at L thigh, but worse around knee.       Communication   Communication: No difficulties  Cognition Arousal/Alertness: Awake/alert Behavior During Therapy: WFL for tasks assessed/performed Overall Cognitive Status: Within Functional Limits for tasks assessed                                          General Comments      Exercises Total Joint Exercises Ankle Circles/Pumps: PROM, 10 reps, Left, Supine Quad Sets: AROM, Left, 5 reps, Supine Short Arc Quad: AAROM, Supine, Left, 5 reps Heel Slides: AAROM, Supine, Left, 5 reps   Assessment/Plan    PT Assessment Patient needs continued PT services  PT Problem List Decreased strength;Pain;Decreased activity tolerance;Decreased range of motion;Decreased knowledge of use of DME;Decreased balance;Decreased safety awareness;Decreased mobility;Decreased knowledge of precautions       PT Treatment Interventions DME instruction;Therapeutic exercise;Gait training;Balance training;Neuromuscular re-education;Modalities;Manual techniques;Functional mobility  training;Therapeutic activities;Patient/family education;Stair training    PT Goals (Current goals can be found in the Care Plan section)  Acute Rehab PT Goals Patient Stated Goal: decreased pain, LLE to move PT Goal Formulation: With patient Time For Goal Achievement: 08/06/22 Potential to Achieve Goals: Fair    Frequency BID     Co-evaluation               AM-PAC PT "6 Clicks" Mobility  Outcome Measure Help needed turning from your back to your side while in a flat bed without using bedrails?: A Lot Help needed moving from lying on your back to sitting on the side of a flat bed without using bedrails?: A Lot Help needed moving to and from a bed to a chair (including a wheelchair)?: A Lot Help needed standing up from a chair using your arms (e.g., wheelchair or bedside chair)?: Total Help needed to walk in hospital room?: Total Help needed climbing 3-5 steps with a railing? : Total 6 Click Score: 9    End of Session   Activity Tolerance: Patient limited by pain Patient left: in chair;with call bell/phone within reach Nurse Communication: Mobility status (pain, decreased sensation & decreased strength in LLE) PT Visit Diagnosis: Pain;Difficulty in walking, not elsewhere classified (R26.2);Muscle weakness (generalized) (  M62.81);Other abnormalities of gait and mobility (R26.89) Pain - Right/Left: Left Pain - part of body: Knee    Time: [id number] PT Time Calculation (min) (ACUTE ONLY): 32 min   Charges:   PT Evaluation $PT Eval Moderate Complexity: 1 Mod PT Treatments $Therapeutic Activity: 8-22 mins        Lavone Nian, PT, DPT 07/23/22, 10:00 AM   Waunita Schooner 07/23/2022, 10:00 AM

## 2022-07-23 NOTE — Progress Notes (Signed)
Physical Therapy Treatment Patient Details Name: Holly Davidson MRN: [id number] DOB: [birth year] Today's Date: 07/23/2022   History of Present Illness Pt is a 57 y/o F admitted on 07/22/22 for scheduled L TKA. PMH: lumbar DDD, dizziness, fibromyalgia, heart murmur, lupus, herniated lumbar intervertebral disc, MCTD, pre-diabetes    PT Comments    Pt seen for PT tx with pt agreeable. Pt mobilizes better with less pain during this session. Pt requires min assist & extra time to complete supine>sit with pt frequently leaning back despite PT educating her on importance & increased ease of leaning anteriorly. Pt is able to transfer STS with min assist<>supervision with RW, stand pivot with RW & min assist. Pt progresses to taking 2 steps to sink with RW & min assist with decreased weight shift to LLE in stance phase with pt relying on BUE on RW; pt is actually able to step 1 time with LLE & partially clear floor with L foot. Continue to recommend PT services to address LLE strengthening & ROM, transfers, & gait.    Recommendations for follow up therapy are one component of a multi-disciplinary discharge planning process, led by the attending physician.  Recommendations may be updated based on patient status, additional functional criteria and insurance authorization.  Follow Up Recommendations  Skilled nursing-short term rehab (<3 hours/day) Can patient physically be transported by private vehicle: No   Assistance Recommended at Discharge Frequent or constant Supervision/Assistance  Patient can return home with the following Help with stairs or ramp for entrance;Assist for transportation;Assistance with cooking/housework;A lot of help with walking and/or transfers;A lot of help with bathing/dressing/bathroom   Equipment Recommendations  Rolling walker (2 wheels);BSC/3in1    Recommendations for Other Services       Precautions / Restrictions Precautions Precautions: Fall;Knee Restrictions Weight  Bearing Restrictions: Yes LLE Weight Bearing: Weight bearing as tolerated     Mobility  Bed Mobility Overal bed mobility: Needs Assistance Bed Mobility: Supine to Sit     Supine to sit: Min assist     General bed mobility comments: Extra time, use of bed rails, HOB slightly elevated, assistance to move LLE off of bed.    Transfers Overall transfer level: Needs assistance Equipment used: Rolling walker (2 wheels) Transfers: Sit to/from Stand, Bed to chair/wheelchair/BSC Sit to Stand: Min assist, Supervision (Pt able to transfer STS from elevated EOB with min assist, STS from [facility] with supervision. Education re: hand placement during transfers.) Stand pivot transfers: Min assist (Extra time, pt unable to move LLE to slide it across floor BSC but is able to pivot on RLE & turn to toilet)        Lateral/Scoot Transfers: Min assist General transfer comment: Pt completes lateral scoot bed>drop arm recliner with min assist with PT providing cuing for head/hips relationship & sequencing; pt requires extra time.    Ambulation/Gait Ambulation/Gait assistance: Min assist Gait Distance (Feet): 2 Feet Assistive device: Rolling walker (2 wheels) Gait Pattern/deviations: Decreased step length - right, Decreased step length - left, Decreased stride length Gait velocity: decreased     General Gait Details: Pt takes 2 steps from [facility] to sink with RW & Min assist with decreased weight shift to LLE as pt offsets & uses BUE on RW to support herself.   Stairs             Wheelchair Mobility    Modified Rankin (Stroke Patients Only)       Balance Overall balance assessment: Needs assistance Sitting-balance support: Feet supported  Sitting balance-Leahy Scale: Fair     Standing balance support: Bilateral upper extremity supported, During functional activity, Reliant on assistive device for balance Standing balance-Leahy Scale: Fair                               Cognition Arousal/Alertness: Awake/alert Behavior During Therapy: WFL for tasks assessed/performed Overall Cognitive Status: Within Functional Limits for tasks assessed                                 General Comments: Tangential conversation with cuing to redirect        Exercises Total Joint Exercises Ankle Circles/Pumps: PROM, 10 reps, Left, Supine Quad Sets: AROM, Left, 5 reps, Supine Short Arc Quad: AAROM, Supine, Left, 5 reps Heel Slides: AAROM, Supine, Left, 5 reps    General Comments General comments (skin integrity, edema, etc.): Pt with continent void on BSC, performs peri hygiene without assistance.      Pertinent Vitals/Pain Pain Assessment Pain Assessment: Faces Faces Pain Scale: Hurts even more Pain Location: L knee Pain Descriptors / Indicators: Discomfort, Grimacing, Guarding Pain Intervention(s): Monitored during session    Home Living                          Prior Function            PT Goals (current goals can now be found in the care plan section) Acute Rehab PT Goals Patient Stated Goal: decreased pain, LLE to move PT Goal Formulation: With patient Time For Goal Achievement: 08/06/22 Potential to Achieve Goals: Fair Progress towards PT goals: Progressing toward goals    Frequency    BID      PT Plan Current plan remains appropriate;Equipment recommendations need to be updated    Co-evaluation              AM-PAC PT "6 Clicks" Mobility   Outcome Measure  Help needed turning from your back to your side while in a flat bed without using bedrails?: A Little Help needed moving from lying on your back to sitting on the side of a flat bed without using bedrails?: A Lot Help needed moving to and from a bed to a chair (including a wheelchair)?: A Lot Help needed standing up from a chair using your arms (e.g., wheelchair or bedside chair)?: A Lot Help needed to walk in hospital room?: Total Help needed climbing  3-5 steps with a railing? : Total 6 Click Score: 11    End of Session Equipment Utilized During Treatment: Gait belt Activity Tolerance: Patient limited by pain;Patient tolerated treatment well Patient left: in chair;with call bell/phone within reach Nurse Communication: Mobility status (pt void during session) PT Visit Diagnosis: Pain;Difficulty in walking, not elsewhere classified (R26.2);Muscle weakness (generalized) (M62.81);Other abnormalities of gait and mobility (R26.89) Pain - Right/Left: Left Pain - part of body: Knee     Time: [id number] PT Time Calculation (min) (ACUTE ONLY): 30 min  Charges:  $Therapeutic Activity: 23-37 mins                     Lavone Nian, PT, DPT 07/23/22, 3:53 PM   Waunita Schooner 07/23/2022, 3:51 PM

## 2022-07-23 NOTE — Progress Notes (Signed)
Subjective: 1 Day Post-Op Procedure(s) (LRB): LEFT TOTAL KNEE ARTHROPLASTY (Left)  Holly Davidson feeling better this morning. She is hoping to go home today.  Activity level:  wbat Diet tolerance:  ok Voiding:  ok Patient reports pain as mild and moderate.    Objective: Vital signs in last 24 hours: Temp:  [97.2 F (36.2 C)-98.4 F (36.9 C)] 97.5 F (36.4 C) (03/06 0556) Pulse Rate:  [49-83] 66 (03/06 0556) Resp:  [9-20] 19 (03/06 0556) BP: (107-143)/(63-99) 107/71 (03/06 0556) SpO2:  [94 %-100 %] 96 % (03/06 0556) Weight:  [93.9 kg] 93.9 kg (03/05 0924)  Labs: No results for input(s): "HGB" in the last 72 hours. No results for input(s): "WBC", "RBC", "HCT", "PLT" in the last 72 hours. No results for input(s): "NA", "K", "CL", "CO2", "BUN", "CREATININE", "GLUCOSE", "CALCIUM" in the last 72 hours. No results for input(s): "LABPT", "INR" in the last 72 hours.  Physical Exam:  Neurologically intact ABD soft Neurovascular intact Sensation intact distally Intact pulses distally Dorsiflexion/Plantar flexion intact Incision: dressing C/D/I and no drainage No cellulitis present Compartment soft  Assessment/Plan:  1 Day Post-Op Procedure(s) (LRB): LEFT TOTAL KNEE ARTHROPLASTY (Left) Advance diet Up with therapy D/C IV fluids Discharge home with home health today after PT if cleared and doing well. Follow up in office 2 weeks post op. Continue on 81 mg asa BID for DVT prevention.     Larwance Sachs Jesus Nevills 07/23/2022, 8:06 AM

## 2022-07-24 DIAGNOSIS — Z9049 Acquired absence of other specified parts of digestive tract: Secondary | ICD-10-CM | POA: Diagnosis not present

## 2022-07-24 DIAGNOSIS — E559 Vitamin D deficiency, unspecified: Secondary | ICD-10-CM | POA: Diagnosis present

## 2022-07-24 DIAGNOSIS — M797 Fibromyalgia: Secondary | ICD-10-CM | POA: Diagnosis present

## 2022-07-24 DIAGNOSIS — M1712 Unilateral primary osteoarthritis, left knee: Secondary | ICD-10-CM | POA: Diagnosis present

## 2022-07-24 DIAGNOSIS — Z833 Family history of diabetes mellitus: Secondary | ICD-10-CM | POA: Diagnosis not present

## 2022-07-24 DIAGNOSIS — Z9884 Bariatric surgery status: Secondary | ICD-10-CM | POA: Diagnosis not present

## 2022-07-24 DIAGNOSIS — L930 Discoid lupus erythematosus: Secondary | ICD-10-CM | POA: Diagnosis present

## 2022-07-24 DIAGNOSIS — Z823 Family history of stroke: Secondary | ICD-10-CM | POA: Diagnosis not present

## 2022-07-24 DIAGNOSIS — Z87891 Personal history of nicotine dependence: Secondary | ICD-10-CM | POA: Diagnosis not present

## 2022-07-24 DIAGNOSIS — Z90710 Acquired absence of both cervix and uterus: Secondary | ICD-10-CM | POA: Diagnosis not present

## 2022-07-24 DIAGNOSIS — K219 Gastro-esophageal reflux disease without esophagitis: Secondary | ICD-10-CM | POA: Diagnosis present

## 2022-07-24 DIAGNOSIS — R7303 Prediabetes: Secondary | ICD-10-CM | POA: Diagnosis present

## 2022-07-24 DIAGNOSIS — Z825 Family history of asthma and other chronic lower respiratory diseases: Secondary | ICD-10-CM | POA: Diagnosis not present

## 2022-07-24 DIAGNOSIS — Z79899 Other long term (current) drug therapy: Secondary | ICD-10-CM | POA: Diagnosis not present

## 2022-07-24 DIAGNOSIS — Z8249 Family history of ischemic heart disease and other diseases of the circulatory system: Secondary | ICD-10-CM | POA: Diagnosis not present

## 2022-07-24 NOTE — Progress Notes (Signed)
Physical Therapy Treatment Patient Details Name: Holly Davidson MRN: [id number] DOB: 10/22/1965 Today's Date: 07/24/2022   History of Present Illness Pt is a 57 y/o F admitted on 07/22/22 for scheduled L TKA. PMH: lumbar DDD, dizziness, fibromyalgia, heart murmur, lupus, herniated lumbar intervertebral disc, MCTD, pre-diabetes    PT Comments    Pt seen for PT tx with pt agreeable. Pt is able to complete bed mobility with hospital bed features & compensatory technique & STS with CGA<>Min assist.  Pt ambulates bed>BSC with RW & min assist, endorsing dizziness during gait but no more until she transferred STS from [facility]. Pt assisted BSC>recliner via stand pivot with RW. BP checked & noted to be low, nurse called to room & pt left in recliner in care of nurse.  Vitals sitting in chair in LUE: BP 56/36 mmHg MAP 45, HR 81 bpm, SpO2 98% Rechecked BP 73/56 mmHg MAP 60   Recommendations for follow up therapy are one component of a multi-disciplinary discharge planning process, led by the attending physician.  Recommendations may be updated based on patient status, additional functional criteria and insurance authorization.  Follow Up Recommendations  Follow physician's recommendations for discharge plan and follow up therapies     Assistance Recommended at Discharge Frequent or constant Supervision/Assistance  Patient can return home with the following Help with stairs or ramp for entrance;Assist for transportation;Assistance with cooking/housework;A lot of help with walking and/or transfers;A lot of help with bathing/dressing/bathroom   Equipment Recommendations  Rolling walker (2 wheels);BSC/3in1    Recommendations for Other Services       Precautions / Restrictions Precautions Precautions: Fall;Knee Restrictions Weight Bearing Restrictions: Yes LLE Weight Bearing: Weight bearing as tolerated     Mobility  Bed Mobility Overal bed mobility: Needs Assistance Bed Mobility: Supine to Sit      Supine to sit: Supervision, HOB elevated     General bed mobility comments: Pt is able to use RLE to move LLE to EOB, reliant on HOB elevated & bed rails, with extra time to complete supine>sit.    Transfers Overall transfer level: Needs assistance Equipment used: Rolling walker (2 wheels) Transfers: Sit to/from Stand Sit to Stand: Min guard, Min assist Stand pivot transfers: Min assist, Mod assist (BSC>recliner with RW)         General transfer comment: STS from EOB & BSC with RW & CGA<>Min assist.    Ambulation/Gait Ambulation/Gait assistance: Min assist Gait Distance (Feet): 8 Feet Assistive device: Rolling walker (2 wheels) Gait Pattern/deviations: Decreased step length - right, Decreased step length - left, Decreased stride length Gait velocity: decreased     General Gait Details: Pt is able to ambulate bed>BSC ~8 ft with RW & min assist with decreased LLE foot clearance but pt able to clear foot & advance. Pt with decreased LLE hip/knee flexion during swing phase, as well as decreased LLE heel strike. Heavy reliance on BUE on RW to decrease weight bearing through LLE.   Stairs             Wheelchair Mobility    Modified Rankin (Stroke Patients Only)       Balance Overall balance assessment: Needs assistance Sitting-balance support: Feet supported Sitting balance-Leahy Scale: Fair     Standing balance support: Bilateral upper extremity supported, During functional activity, Reliant on assistive device for balance Standing balance-Leahy Scale: Fair  Cognition Arousal/Alertness: Awake/alert Behavior During Therapy: Flat affect Overall Cognitive Status: Within Functional Limits for tasks assessed                                          Exercises      General Comments General comments (skin integrity, edema, etc.): Pt with continent void on BSC, performs peri hygiene without assistance.       Pertinent Vitals/Pain Pain Assessment Pain Assessment: Faces Faces Pain Scale: Hurts even more Pain Location: L knee Pain Descriptors / Indicators: Discomfort, Grimacing, Guarding Pain Intervention(s): Monitored during session    Home Living                          Prior Function            PT Goals (current goals can now be found in the care plan section) Acute Rehab PT Goals Patient Stated Goal: decreased pain, LLE to move PT Goal Formulation: With patient Time For Goal Achievement: 08/06/22 Potential to Achieve Goals: Fair Progress towards PT goals: Progressing toward goals    Frequency    BID      PT Plan Discharge plan needs to be updated    Co-evaluation              AM-PAC PT "6 Clicks" Mobility   Outcome Measure  Help needed turning from your back to your side while in a flat bed without using bedrails?: A Little Help needed moving from lying on your back to sitting on the side of a flat bed without using bedrails?: A Lot Help needed moving to and from a bed to a chair (including a wheelchair)?: A Lot Help needed standing up from a chair using your arms (e.g., wheelchair or bedside chair)?: A Lot Help needed to walk in hospital room?: A Lot Help needed climbing 3-5 steps with a railing? : Total 6 Click Score: 12    End of Session Equipment Utilized During Treatment: Gait belt Activity Tolerance: Treatment limited secondary to medical complications (Comment) (low BP) Patient left: in chair;with nursing/sitter in room Nurse Communication:  (BP, c/o dizziness) PT Visit Diagnosis: Pain;Difficulty in walking, not elsewhere classified (R26.2);Muscle weakness (generalized) (M62.81);Other abnormalities of gait and mobility (R26.89) Pain - Right/Left: Left Pain - part of body: Knee     Time: 1041-1105 PT Time Calculation (min) (ACUTE ONLY): 24 min  Charges:  $Therapeutic Activity: 23-37 mins                     Lavone Nian, PT,  DPT 07/24/22, 11:17 AM   Waunita Schooner 07/24/2022, 11:15 AM

## 2022-07-24 NOTE — Progress Notes (Signed)
Physical Therapy Treatment Patient Details Name: Holly Davidson MRN: [id number] DOB: Mar 03, 1966 Today's Date: 07/24/2022   History of Present Illness Pt is a 57 y/o F admitted on 07/22/22 for scheduled L TKA. PMH: lumbar DDD, dizziness, fibromyalgia, heart murmur, lupus, herniated lumbar intervertebral disc, MCTD, pre-diabetes    PT Comments    Pt seen for PT tx with pt agreeable. Pt is able to complete STS from various heights surfaces with RW & CGA<>Min assist, ambulate to bathroom & into hallway with RW & CGA. Pt demonstrates improving activity tolerance & standing balance but still unable to perform LAQ. Pt without c/o dizziness during session. Continue to recommend ongoing PT services to address deficits noted.    Recommendations for follow up therapy are one component of a multi-disciplinary discharge planning process, led by the attending physician.  Recommendations may be updated based on patient status, additional functional criteria and insurance authorization.  Follow Up Recommendations  Follow physician's recommendations for discharge plan and follow up therapies Can patient physically be transported by private vehicle: Yes   Assistance Recommended at Discharge Frequent or constant Supervision/Assistance  Patient can return home with the following Help with stairs or ramp for entrance;Assist for transportation;Assistance with cooking/housework;A little help with walking and/or transfers;A little help with bathing/dressing/bathroom   Equipment Recommendations  Rolling walker (2 wheels);BSC/3in1    Recommendations for Other Services       Precautions / Restrictions Precautions Precautions: Fall;Knee Restrictions Weight Bearing Restrictions: Yes LLE Weight Bearing: Weight bearing as tolerated     Mobility  Bed Mobility Overal bed mobility: Needs Assistance Bed Mobility: Sit to Supine     Supine to sit: Supervision, HOB elevated Sit to supine: Min assist   General  bed mobility comments: Pt attempts to elevate LLE onto bed via compensatory technique with use of RLE but ultimately requires min assist from PT to elevate LLE.    Transfers Overall transfer level: Needs assistance Equipment used: Rolling walker (2 wheels) Transfers: Sit to/from Stand Sit to Stand: Min guard, Min assist Stand pivot transfers: Min assist, Mod assist (BSC>recliner with RW)         General transfer comment: STS from recliner & low toilet with use of grab bar    Ambulation/Gait Ambulation/Gait assistance: Min guard Gait Distance (Feet):  (15 + 35 ft) Assistive device: Rolling walker (2 wheels) Gait Pattern/deviations: Decreased step length - left, Decreased step length - right, Decreased dorsiflexion - right, Decreased dorsiflexion - left, Decreased stride length Gait velocity: decreased     General Gait Details: Pt is able to barely lift LLE to clear foot from floor to advance it, minimal to no L hip/knee flexion during swing phase, decreased heel strike BLE, decreased weight shift to LLE in stance phase, heavy reliance on BUE on RW during gait.   Stairs             Wheelchair Mobility    Modified Rankin (Stroke Patients Only)       Balance Overall balance assessment: Needs assistance Sitting-balance support: Feet supported Sitting balance-Leahy Scale: Fair     Standing balance support: During functional activity, No upper extremity supported Standing balance-Leahy Scale: Fair Standing balance comment: Pt able to stand at sink & perform grooming tasks at sink without BUE support without LOB. Relies on BUE on RW during gait.                            Cognition Arousal/Alertness:  Awake/alert Behavior During Therapy: Flat affect Overall Cognitive Status: Within Functional Limits for tasks assessed                                          Exercises      General Comments General comments (skin integrity, edema,  etc.): Pt attempts to void on toilet but unable (notes it takes her significantly long time to initiate urination even prior to admission). Pt does perform grooming tasks standing at sink (wash face & hands, brush teeth).      Pertinent Vitals/Pain Pain Assessment Pain Assessment: Faces Faces Pain Scale: Hurts even more Pain Location: L knee Pain Descriptors / Indicators: Discomfort, Grimacing, Guarding Pain Intervention(s): Monitored during session, Premedicated before session    Home Living                          Prior Function            PT Goals (current goals can now be found in the care plan section) Acute Rehab PT Goals Patient Stated Goal: decreased pain, LLE to move PT Goal Formulation: With patient Time For Goal Achievement: 08/06/22 Potential to Achieve Goals: Fair Progress towards PT goals: Progressing toward goals    Frequency    BID      PT Plan Current plan remains appropriate    Co-evaluation              AM-PAC PT "6 Clicks" Mobility   Outcome Measure  Help needed turning from your back to your side while in a flat bed without using bedrails?: A Little Help needed moving from lying on your back to sitting on the side of a flat bed without using bedrails?: A Little Help needed moving to and from a bed to a chair (including a wheelchair)?: A Little Help needed standing up from a chair using your arms (e.g., wheelchair or bedside chair)?: A Little Help needed to walk in hospital room?: A Little Help needed climbing 3-5 steps with a railing? : Total 6 Click Score: 16    End of Session Equipment Utilized During Treatment: Gait belt Activity Tolerance: Patient tolerated treatment well Patient left: in bed;with family/visitor present;with call bell/phone within reach Nurse Communication:  (BP, c/o dizziness) PT Visit Diagnosis: Pain;Difficulty in walking, not elsewhere classified (R26.2);Muscle weakness (generalized) (M62.81);Other  abnormalities of gait and mobility (R26.89) Pain - Right/Left: Left Pain - part of body: Knee     Time: [id number] PT Time Calculation (min) (ACUTE ONLY): 32 min  Charges:  $Therapeutic Activity: 23-37 mins                     Holly Davidson, PT, DPT 07/24/22, 3:02 PM   Holly Davidson 07/24/2022, 3:01 PM

## 2022-07-24 NOTE — Plan of Care (Signed)
  Problem: Education: Goal: Knowledge of the prescribed therapeutic regimen will improve Outcome: Progressing   Problem: Activity: Goal: Range of joint motion will improve Outcome: Progressing   Problem: Pain Management: Goal: Pain level will decrease with appropriate interventions Outcome: Progressing   Problem: Safety: Goal: Ability to remain free from injury will improve Outcome: Progressing   

## 2022-07-24 NOTE — Progress Notes (Signed)
OT Cancellation Note  Patient Details Name: Holly Davidson MRN: [id number] DOB: May 20, 1965   Cancelled Treatment:    Reason Eval/Treat Not Completed: Other (comment). Patient orthostatic with PT. Will continue to follow.  Lenward Chancellor 07/24/2022, 12:43 PM

## 2022-07-24 NOTE — Progress Notes (Signed)
Subjective: 2 Days Post-Op Procedure(s) (LRB): LEFT TOTAL KNEE ARTHROPLASTY (Left)  Patient resting comfortably in bed this morning. She has some pain in her leg. She was able to ambulate short distances with PT yesterday afternoon.  Activity level:  wbat Diet tolerance:  ok Voiding:  ok Patient reports pain as mild.    Objective: Vital signs in last 24 hours: Temp:  [97.9 F (36.6 C)-98.4 F (36.9 C)] 97.9 F (36.6 C) (03/07 0610) Pulse Rate:  [63-82] 82 (03/07 0610) Resp:  [17-18] 18 (03/07 0610) BP: (109-128)/(73-79) 121/79 (03/07 0610) SpO2:  [97 %-100 %] 100 % (03/07 0610)  Labs: No results for input(s): "HGB" in the last 72 hours. No results for input(s): "WBC", "RBC", "HCT", "PLT" in the last 72 hours. No results for input(s): "NA", "K", "CL", "CO2", "BUN", "CREATININE", "GLUCOSE", "CALCIUM" in the last 72 hours. No results for input(s): "LABPT", "INR" in the last 72 hours.  Physical Exam:  Neurologically intact ABD soft Neurovascular intact Sensation intact distally Intact pulses distally Dorsiflexion/Plantar flexion intact Incision: dressing C/D/I and no drainage No cellulitis present Compartment soft  Assessment/Plan:  2 Days Post-Op Procedure(s) (LRB): LEFT TOTAL KNEE ARTHROPLASTY (Left) Advance diet Up with therapy Discharge home with home health hopefully today if cleared and doing better with PT. Continue on '81mg'$  asa BID for DVT prevention.  Follow up in office 2 weeks post op.    Larwance Sachs Yedidya Duddy 07/24/2022, 7:03 AM

## 2022-07-25 NOTE — Evaluation (Signed)
Occupational Therapy Evaluation Patient Details Name: Holly Davidson MRN: [id number] DOB: 03-Apr-1966 Today's Date: 07/25/2022   History of Present Illness Pt is a 57 y/o F admitted on 07/22/22 for scheduled L TKA. PMH: lumbar DDD, dizziness, fibromyalgia, heart murmur, lupus, herniated lumbar intervertebral disc, MCTD, pre-diabetes   Clinical Impression   Patient evaluated by Occupational Therapy with no further acute OT needs identified. Will defer to continued OT in pt's home as pt reports plan to discharge home today and RN to room to confirm this will be around 5:00 PM. All education has been completed including shower, toilet and car transfer training, ADL retraining with compensatory and safety techniques, fall prevention, DME/AE recommendations, and the patient has no further questions.  See below for any follow-up Occupational Therapy or equipment needs. Pt met OT goal. OT is signing off. Thank you for this referral.       Recommendations for follow up therapy are one component of a multi-disciplinary discharge planning process, led by the attending physician.  Recommendations may be updated based on patient status, additional functional criteria and insurance authorization.   Follow Up Recommendations  Home health OT     Assistance Recommended at Discharge Intermittent Supervision/Assistance  Patient can return home with the following Assist for transportation;Assistance with cooking/housework;A little help with bathing/dressing/bathroom;A little help with walking and/or transfers    Functional Status Assessment  Patient has had a recent decline in their functional status and demonstrates the ability to make significant improvements in function in a reasonable and predictable amount of time.  Equipment Recommendations  None recommended by OT    Recommendations for Other Services       Precautions / Restrictions Precautions Precautions: Fall;Knee Restrictions Weight Bearing  Restrictions: No LLE Weight Bearing: Weight bearing as tolerated      Mobility Bed Mobility Overal bed mobility: Needs Assistance Bed Mobility: Supine to Sit     Supine to sit: Supervision, HOB elevated (Using hook techinque with RLE.) Sit to supine: Min assist, HOB elevated (To lift/support LLE.)        Transfers                   General transfer comment: Pt reports that she had not practiced car transfer.  OT demonstrated method of safe car t/f with simulation anc ues for sequencing and safety.      Balance Overall balance assessment: Needs assistance Sitting-balance support: Feet supported Sitting balance-Leahy Scale: Good     Standing balance support: During functional activity, No upper extremity supported Standing balance-Leahy Scale: Fair Standing balance comment: Pt able to stand at sink & perform grooming tasks at sink without BUE support without LOB. Relies on BUE on RW during gait.                           ADL either performed or assessed with clinical judgement   ADL Overall ADL's : Needs assistance/impaired Eating/Feeding: Independent   Grooming: Wash/dry hands;Oral care;Wash/dry face;Standing;Supervision/safety;Min guard;Cueing for safety Grooming Details (indicate cue type and reason): Pt cued on positioning RW anterior to sink for safety. Upper Body Bathing: Sitting;Set up   Lower Body Bathing: Set up;Sitting/lateral leans;Supervison/ safety   Upper Body Dressing : Set up;Sitting   Lower Body Dressing: Sitting/lateral leans;Sit to/from stand;Set up;Min guard Lower Body Dressing Details (indicate cue type and reason): Pt ale to doff and don LT sock with increased time/effort, without AE while pt's KI donned. Standign Probation officer  with Min guard for steadying. Toilet Transfer: Min guard;Grab bars;Ambulation;Rolling walker (2 wheels);BSC/3in1 Toilet Transfer Details (indicate cue type and reason): Pt shown how to position BSC  over toilet for ease and educated on finding correct height for pt and angling for ease and pain management. Pt demonstrated On/off BSC atop toilet with grab bar and Min guard Toileting- Clothing Manipulation and Hygiene: Modified independent;Sitting/lateral lean Toileting - Clothing Manipulation Details (indicate cue type and reason): Pt able to complete hygiene Mod I. Tub/ Shower Transfer: Tub Buyer, retail Details (indicate cue type and reason): Pt has been using her tub bench at home consistently. She is unsure if it's in storage but expressed confidence that her insurance will provide anoher if needed. Functional mobility during ADLs: Min guard;Supervision/safety;Rolling walker (2 wheels) (KI)     IADLS: Pt educated on RW safety in kitchen including how to safely approach cabinets, having a chair or bar stool in kitchen to avoid prolonged standing, and having family bring most used items out to counter top height for increased accesibility.  Pt educated on walker accessories such as walker bag or tray. Recommended family support as possible.  Pt verbalized and.or demonstrated understanding.      Vision Baseline Vision/History: 1 Wears glasses Vision Assessment?: No apparent visual deficits     Perception     Praxis      Pertinent Vitals/Pain Pain Assessment Pain Assessment: 0-10 Pain Score: 7  Pain Location: L knee at end of activity, back in bed. Pain Descriptors / Indicators: Discomfort, Grimacing, Guarding Pain Intervention(s): Limited activity within patient's tolerance, Monitored during session, Repositioned, Ice applied     Hand Dominance     Extremity/Trunk Assessment Upper Extremity Assessment Upper Extremity Assessment: Overall WFL for tasks assessed   Lower Extremity Assessment Lower Extremity Assessment: Defer to PT evaluation   Cervical / Trunk Assessment Cervical / Trunk Assessment: Normal   Communication Communication Communication: No  difficulties   Cognition Arousal/Alertness: Awake/alert Behavior During Therapy: WFL for tasks assessed/performed Overall Cognitive Status: Within Functional Limits for tasks assessed                                       General Comments       Exercises     Shoulder Instructions      Home Living Family/patient expects to be discharged to:: Private residence Living Arrangements: Children;Spouse/significant other Available Help at Discharge: Family;Available 24 hours/day Type of Home: Apartment Home Access: Ramped entrance     Home Layout: One level     Bathroom Shower/Tub: Tub/shower unit;Curtain   Biochemist, clinical: Standard     Home Equipment: Conservation officer, nature (2 wheels);Cane - single point;Tub bench;Hand held shower head;Adaptive equipment (Pt reports that a BSC has been ordered.) Adaptive Equipment: Long-handled sponge Additional Comments: Pt reports she lives with her son & 69 y/o grandson (grandson is home & able to assist during the day).      Prior Functioning/Environment Prior Level of Function : Driving;Independent/Modified Independent             Mobility Comments: Pt reports she's ambulatory with hurry-cane, denies falls but endorses "stumbling", still driving. ADLs Comments: Has been Mod I.        OT Problem List: Pain;Decreased range of motion;Increased edema;Decreased activity tolerance;Impaired balance (sitting and/or standing)      OT Treatment/Interventions:      OT Goals(Current goals can be found in  the care plan section) Acute Rehab OT Goals Patient Stated Goal: Pt anticipates discharge home today in the evening. OT Goal Formulation: All assessment and education complete, DC therapy ADL Goals Additional ADL Goal #1: Pt will demonstrate UE/LE dressing, donning/doffing of LT KI, correct positioning of RW for ADLs and IADL safety in bathroom and kitchen, Toilet transfers and hygiene, and compensatory strategies for LB ADLs with  no more than Min guard assist.  OT Frequency:      Co-evaluation              AM-PAC OT "6 Clicks" Daily Activity     Outcome Measure Help from another person eating meals?: None Help from another person taking care of personal grooming?: A Little Help from another person toileting, which includes using toliet, bedpan, or urinal?: A Little Help from another person bathing (including washing, rinsing, drying)?: A Little Help from another person to put on and taking off regular upper body clothing?: A Little Help from another person to put on and taking off regular lower body clothing?: A Little 6 Click Score: 19   End of Session Equipment Utilized During Treatment: Gait belt;Rolling walker (2 wheels)  Activity Tolerance: Patient tolerated treatment well Patient left: in bed;with call bell/phone within reach;with bed alarm set  OT Visit Diagnosis: Unsteadiness on feet (R26.81);Pain Pain - Right/Left: Left Pain - part of body: Knee                Time: [id number] OT Time Calculation (min): 49 min Charges:  OT General Charges $OT Visit: 1 Visit OT Evaluation $OT Eval Low Complexity: 1 Low OT Treatments $Self Care/Home Management : 8-22 mins $Therapeutic Activity: 8-22 mins  Anderson Malta, OT Acute Rehab Services Office: [phone] 07/25/2022  Julien Girt 07/25/2022, 11:41 AM

## 2022-07-25 NOTE — Progress Notes (Signed)
Physical Therapy Treatment Patient Details Name: Holly Davidson MRN: [id number] DOB: October 27, 1965 Today's Date: 07/25/2022   History of Present Illness Pt is a 57 y/o F admitted on 07/22/22 for scheduled L TKA. PMH: lumbar DDD, dizziness, fibromyalgia, heart murmur, lupus, herniated lumbar intervertebral disc, MCTD, pre-diabetes    PT Comments    POD # 3 am session Pt was OOB and amb to bathroom earlier with OT so performed TKR TE's followed by ICE. Will see pt again this afternoon for mobility then expected to D/C to home later today.    Recommendations for follow up therapy are one component of a multi-disciplinary discharge planning process, led by the attending physician.  Recommendations may be updated based on patient status, additional functional criteria and insurance authorization.  Follow Up Recommendations  Follow physician's recommendations for discharge plan and follow up therapies Can patient physically be transported by private vehicle: Yes   Assistance Recommended at Discharge Frequent or constant Supervision/Assistance  Patient can return home with the following Help with stairs or ramp for entrance;Assist for transportation;Assistance with cooking/housework;A little help with walking and/or transfers;A little help with bathing/dressing/bathroom   Equipment Recommendations  Rolling walker (2 wheels);BSC/3in1    Recommendations for Other Services       Precautions / Restrictions Precautions Precautions: Fall;Knee Precaution Comments: instructed no pillow under knee Restrictions Weight Bearing Restrictions: No LLE Weight Bearing: Weight bearing as tolerated     Mobility  Bed Mobility                    Transfers                        Ambulation/Gait                   Stairs             Wheelchair Mobility    Modified Rankin (Stroke Patients Only)       Balance                                             Cognition Arousal/Alertness: Awake/alert Behavior During Therapy: WFL for tasks assessed/performed Overall Cognitive Status: Within Functional Limits for tasks assessed                                 General Comments: AxO x 3 pleasant        Exercises  Total Knee Replacement TE's following HEP handout 10 reps B LE ankle pumps 05 reps towel squeezes 05 reps knee presses 05 reps heel slides  05 reps SAQ's 05 reps SLR's 05 reps ABD Educated on use of gait belt to assist with TE's Followed by ICE     General Comments        Pertinent Vitals/Pain Pain Assessment Pain Assessment: Faces Faces Pain Scale: Hurts little more Pain Location: L knee with TE's Pain Descriptors / Indicators: Discomfort, Grimacing, Guarding Pain Intervention(s): Monitored during session, Premedicated before session, Repositioned, Ice applied    Home Living                          Prior Function            PT Goals (current goals can now be  found in the care plan section) Progress towards PT goals: Progressing toward goals    Frequency    7X/week      PT Plan Current plan remains appropriate    Co-evaluation              AM-PAC PT "6 Clicks" Mobility   Outcome Measure  Help needed turning from your back to your side while in a flat bed without using bedrails?: A Little Help needed moving from lying on your back to sitting on the side of a flat bed without using bedrails?: A Little Help needed moving to and from a bed to a chair (including a wheelchair)?: A Little Help needed standing up from a chair using your arms (e.g., wheelchair or bedside chair)?: A Little Help needed to walk in hospital room?: A Little Help needed climbing 3-5 steps with a railing? : A Lot 6 Click Score: 17    End of Session Equipment Utilized During Treatment: Gait belt Activity Tolerance: Patient tolerated treatment well Patient left: in bed;with call bell/phone  within reach Nurse Communication: Mobility status PT Visit Diagnosis: Pain;Difficulty in walking, not elsewhere classified (R26.2);Muscle weakness (generalized) (M62.81);Other abnormalities of gait and mobility (R26.89) Pain - part of body: Knee     Time: 1209-1222 PT Time Calculation (min) (ACUTE ONLY): 13 min  Charges:  $Therapeutic Exercise: 8-22 mins                     {Sixto Bowdish  PTA Acute  Rehabilitation Services Office M-F          [phone] Weekend pager [phone]

## 2022-07-25 NOTE — Progress Notes (Signed)
Subjective: 3 Days Post-Op Procedure(s) (LRB): LEFT TOTAL KNEE ARTHROPLASTY (Left)  Patient did much better with PT yesterday afternoon. We stopped robaxin and had her drink more fluids. She is hoping to go home today.   Activity level:  wbat Diet tolerance:  ok Voiding:  ok Patient reports pain as mild.    Objective: Vital signs in last 24 hours: Temp:  [97.9 F (36.6 C)-99.5 F (37.5 C)] 99.1 F (37.3 C) (03/08 0629) Pulse Rate:  [76-114] 105 (03/08 0629) Resp:  [16-19] 17 (03/08 0629) BP: (102-130)/(64-94) 102/64 (03/08 0629) SpO2:  [93 %-99 %] 98 % (03/08 0629)  Labs: No results for input(s): "HGB" in the last 72 hours. No results for input(s): "WBC", "RBC", "HCT", "PLT" in the last 72 hours. No results for input(s): "NA", "K", "CL", "CO2", "BUN", "CREATININE", "GLUCOSE", "CALCIUM" in the last 72 hours. No results for input(s): "LABPT", "INR" in the last 72 hours.  Physical Exam:  Neurologically intact ABD soft Neurovascular intact Sensation intact distally Intact pulses distally Dorsiflexion/Plantar flexion intact Incision: dressing C/D/I and no drainage No cellulitis present Compartment soft  Assessment/Plan:  3 Days Post-Op Procedure(s) (LRB): LEFT TOTAL KNEE ARTHROPLASTY (Left) Advance diet Up with therapy D/C IV fluids Discharge home with home health today if cleared and doing well with PT.  Follow up in office 2 weeks post op. Continue on 81 mg asa BID for DVT prevention.   Holly Davidson Holly Davidson 07/25/2022, 7:41 AM

## 2022-07-25 NOTE — Plan of Care (Signed)

## 2022-07-25 NOTE — Progress Notes (Signed)
Physical Therapy Treatment Patient Details Name: Holly Davidson MRN: [id number] DOB: [birth year] Today's Date: 07/25/2022   History of Present Illness Pt is a 57 y/o F admitted on 07/22/22 for scheduled L TKA. PMH: lumbar DDD, dizziness, fibromyalgia, heart murmur, lupus, herniated lumbar intervertebral disc, MCTD, pre-diabetes    PT Comments    POD # 3 pm session Assisted OOB to amb in hallway went well.  General bed mobility comments: demonstrated and instructed how to use a belt to self assist LE off and onto bed.  General transfer comment: pt self able with increased time and B UE's for support.  General Gait Details: pt tolerated amb a functional distance of 75 feet with walker with increased time.  Pt able to self advance L LE.  Slow but steady.  Good upper body strength. Addressed all mobility questions, discussed appropriate activity, educated on use of ICE.  Pt ready for D/C to home.   Recommendations for follow up therapy are one component of a multi-disciplinary discharge planning process, led by the attending physician.  Recommendations may be updated based on patient status, additional functional criteria and insurance authorization.  Follow Up Recommendations  Follow physician's recommendations for discharge plan and follow up therapies Can patient physically be transported by private vehicle: Yes   Assistance Recommended at Discharge Frequent or constant Supervision/Assistance  Patient can return home with the following Help with stairs or ramp for entrance;Assist for transportation;Assistance with cooking/housework;A little help with walking and/or transfers;A little help with bathing/dressing/bathroom   Equipment Recommendations  Rolling walker (2 wheels);BSC/3in1    Recommendations for Other Services       Precautions / Restrictions Precautions Precautions: Fall;Knee Precaution Comments: instructed no pillow under knee Restrictions Weight Bearing Restrictions: No LLE  Weight Bearing: Weight bearing as tolerated     Mobility  Bed Mobility Overal bed mobility: Needs Assistance Bed Mobility: Supine to Sit, Sit to Supine     Supine to sit: Supervision Sit to supine: Min assist   General bed mobility comments: demonstrated and instructed how to use a belt to self assist LE off and onto bed.    Transfers Overall transfer level: Needs assistance Equipment used: Rolling walker (2 wheels) Transfers: Sit to/from Stand Sit to Stand: Supervision           General transfer comment: pt self able with increased time and B UE's for support.    Ambulation/Gait Ambulation/Gait assistance: Supervision Gait Distance (Feet): 75 Feet Assistive device: Rolling walker (2 wheels) Gait Pattern/deviations: Decreased step length - left, Decreased step length - right, Decreased dorsiflexion - right, Decreased dorsiflexion - left, Decreased stride length Gait velocity: decreased     General Gait Details: pt tolerated amb a functional distance of 75 feet with walker with increased time.  Pt able to self advance L LE.  Slow but steady.  Good upper body strength.   Stairs             Wheelchair Mobility    Modified Rankin (Stroke Patients Only)       Balance                                            Cognition Arousal/Alertness: Awake/alert Behavior During Therapy: WFL for tasks assessed/performed Overall Cognitive Status: Within Functional Limits for tasks assessed  General Comments: AxO x 3 pleasant        Exercises      General Comments        Pertinent Vitals/Pain Pain Assessment Pain Assessment: Faces Faces Pain Scale: Hurts little more Pain Location: L knee with TE's Pain Descriptors / Indicators: Discomfort, Grimacing, Guarding Pain Intervention(s): Monitored during session, Premedicated before session, Repositioned, Ice applied    Home Living                           Prior Function            PT Goals (current goals can now be found in the care plan section) Progress towards PT goals: Progressing toward goals    Frequency    7X/week      PT Plan Current plan remains appropriate    Co-evaluation              AM-PAC PT "6 Clicks" Mobility   Outcome Measure  Help needed turning from your back to your side while in a flat bed without using bedrails?: A Little Help needed moving from lying on your back to sitting on the side of a flat bed without using bedrails?: A Little Help needed moving to and from a bed to a chair (including a wheelchair)?: A Little Help needed standing up from a chair using your arms (e.g., wheelchair or bedside chair)?: A Little Help needed to walk in hospital room?: A Little Help needed climbing 3-5 steps with a railing? : A Little 6 Click Score: 18    End of Session Equipment Utilized During Treatment: Gait belt Activity Tolerance: Patient tolerated treatment well Patient left: in bed;with call bell/phone within reach Nurse Communication: Mobility status PT Visit Diagnosis: Pain;Difficulty in walking, not elsewhere classified (R26.2);Muscle weakness (generalized) (M62.81);Other abnormalities of gait and mobility (R26.89) Pain - Right/Left: Left Pain - part of body: Knee     Time: 1400-1426 PT Time Calculation (min) (ACUTE ONLY): 26 min  Charges:  $Gait Training: 8-22 mins $Therapeutic Activity: 8-22 mins                     Rica Koyanagi  PTA Acute  Rehabilitation Services Office M-F          [phone] Weekend pager [phone]

## 2022-07-25 NOTE — TOC Progression Note (Signed)
Transition of Care [facility]) - Progression Note   Patient Details  Name: Holly Davidson MRN: [id number] Date of Birth: Dec 24, 1965  Transition of Care [facility]) CM/SW [place], LCSW Phone Number: 07/25/2022, 10:43 AM  Clinical Narrative: Patient wants a 3N1, but declined to private pay for one through [employer] and requested hard script for 3N1 so she can pick one up from a medical supply store of choice. CSW provided patient with hard script.   Barriers to Discharge: No Barriers Identified  Expected Discharge Plan and Services Expected Discharge Date: 07/25/22               DME Arranged: N/A DME Agency: NA HH Arranged: PT HH Agency: [facility] Representative spoke with at [place]: Prearranged in orthopedist's office  Social Determinants of Health ([place]) Interventions [place]: Low Risk  (07/22/2022)  Tobacco Use: Medium Risk (07/22/2022)   Readmission Risk Interventions     No data to display

## 2022-07-30 ENCOUNTER — Encounter (HOSPITAL_COMMUNITY): Payer: Self-pay | Admitting: Orthopaedic Surgery

## 2022-09-29 ENCOUNTER — Other Ambulatory Visit: Payer: Self-pay | Admitting: Internal Medicine

## 2022-09-29 ENCOUNTER — Other Ambulatory Visit: Payer: Self-pay | Admitting: *Deleted

## 2022-09-29 MED ORDER — ROSUVASTATIN CALCIUM 10 MG PO TABS: 10.0000 mg | ORAL_TABLET | Freq: Every day | ORAL | 0 refills | Status: AC

## 2024-01-28 ENCOUNTER — Other Ambulatory Visit: Payer: Self-pay | Admitting: Orthopaedic Surgery

## 2024-01-28 DIAGNOSIS — M5416 Radiculopathy, lumbar region: Secondary | ICD-10-CM

## 2024-02-09 ENCOUNTER — Inpatient Hospital Stay: Admission: RE | Admit: 2024-02-09 | Source: Ambulatory Visit
